# Patient Record
Sex: Male | Born: 1955 | Race: White | Hispanic: No | Marital: Married | Smoking: Current every day smoker
Health system: Southern US, Community
[De-identification: ages and names within clinical notes are randomized; demographics above are authoritative.]

## PROBLEM LIST (undated history)

## (undated) DIAGNOSIS — J45909 Unspecified asthma, uncomplicated: Secondary | ICD-10-CM

## (undated) DIAGNOSIS — I10 Essential (primary) hypertension: Secondary | ICD-10-CM

## (undated) DIAGNOSIS — J449 Chronic obstructive pulmonary disease, unspecified: Secondary | ICD-10-CM

## (undated) DIAGNOSIS — E119 Type 2 diabetes mellitus without complications: Secondary | ICD-10-CM

## (undated) DIAGNOSIS — E291 Testicular hypofunction: Secondary | ICD-10-CM

## (undated) DIAGNOSIS — E785 Hyperlipidemia, unspecified: Secondary | ICD-10-CM

## (undated) DIAGNOSIS — G4730 Sleep apnea, unspecified: Secondary | ICD-10-CM

## (undated) DIAGNOSIS — N529 Male erectile dysfunction, unspecified: Secondary | ICD-10-CM

## (undated) DIAGNOSIS — T7840XA Allergy, unspecified, initial encounter: Secondary | ICD-10-CM

## (undated) DIAGNOSIS — M1990 Unspecified osteoarthritis, unspecified site: Secondary | ICD-10-CM

## (undated) DIAGNOSIS — N189 Chronic kidney disease, unspecified: Secondary | ICD-10-CM

## (undated) HISTORY — DX: Unspecified asthma, uncomplicated: J45.909

## (undated) HISTORY — DX: Male erectile dysfunction, unspecified: N52.9

## (undated) HISTORY — DX: Essential (primary) hypertension: I10

## (undated) HISTORY — DX: Chronic kidney disease, unspecified: N18.9

## (undated) HISTORY — DX: Chronic obstructive pulmonary disease, unspecified: J44.9

## (undated) HISTORY — DX: Unspecified osteoarthritis, unspecified site: M19.90

## (undated) HISTORY — DX: Hyperlipidemia, unspecified: E78.5

## (undated) HISTORY — DX: Allergy, unspecified, initial encounter: T78.40XA

## (undated) HISTORY — DX: Type 2 diabetes mellitus without complications: E11.9

## (undated) HISTORY — PX: DENTAL SURGERY: SHX609

## (undated) HISTORY — DX: Testicular hypofunction: E29.1

## (undated) HISTORY — PX: VASECTOMY: SHX75

## (undated) HISTORY — DX: Sleep apnea, unspecified: G47.30

---

## 2006-09-12 ENCOUNTER — Ambulatory Visit (HOSPITAL_COMMUNITY): Admission: RE | Admit: 2006-09-12 | Discharge: 2006-09-12 | Payer: Self-pay | Admitting: Internal Medicine

## 2008-07-13 ENCOUNTER — Ambulatory Visit (HOSPITAL_COMMUNITY): Admission: RE | Admit: 2008-07-13 | Discharge: 2008-07-13 | Payer: Self-pay | Admitting: Internal Medicine

## 2013-08-28 ENCOUNTER — Telehealth: Payer: Self-pay | Admitting: *Deleted

## 2013-08-28 DIAGNOSIS — E119 Type 2 diabetes mellitus without complications: Secondary | ICD-10-CM

## 2013-08-28 MED ORDER — CANAGLIFLOZIN-METFORMIN HCL 50-1000 MG PO TABS: 50.0000 | ORAL_TABLET | Freq: Two times a day (BID) | ORAL | Status: DC

## 2013-08-28 NOTE — Telephone Encounter (Signed)
RX sent escribed to [facility]

## 2013-09-12 ENCOUNTER — Other Ambulatory Visit: Payer: Self-pay | Admitting: Emergency Medicine

## 2013-09-12 DIAGNOSIS — R7309 Other abnormal glucose: Secondary | ICD-10-CM

## 2013-09-15 ENCOUNTER — Other Ambulatory Visit: Payer: Self-pay

## 2013-09-15 DIAGNOSIS — R7309 Other abnormal glucose: Secondary | ICD-10-CM

## 2013-09-15 LAB — BASIC METABOLIC PANEL WITH GFR
BUN: 13 mg/dL (ref 6–23)
CO2: 24 mEq/L (ref 19–32)
GFR, Est African American: >89 mL/min
Glucose, Bld: 154 mg/dL — ABNORMAL HIGH (ref 70–99)
Potassium: 4.8 mEq/L (ref 3.5–5.3)
Sodium: 137 mEq/L (ref 135–145)

## 2013-09-22 ENCOUNTER — Other Ambulatory Visit: Payer: Self-pay | Admitting: Emergency Medicine

## 2013-09-22 DIAGNOSIS — E119 Type 2 diabetes mellitus without complications: Secondary | ICD-10-CM

## 2013-09-22 MED ORDER — CANAGLIFLOZIN-METFORMIN HCL 50-1000 MG PO TABS: 50.0000 | ORAL_TABLET | Freq: Two times a day (BID) | ORAL | Status: DC

## 2013-10-29 ENCOUNTER — Other Ambulatory Visit: Payer: Self-pay | Admitting: Emergency Medicine

## 2013-10-29 MED ORDER — TADALAFIL 20 MG PO TABS: 20.0000 mg | ORAL_TABLET | Freq: Every day | ORAL | Status: DC | PRN

## 2013-12-19 ENCOUNTER — Other Ambulatory Visit: Payer: Self-pay | Admitting: Emergency Medicine

## 2013-12-28 DIAGNOSIS — E291 Testicular hypofunction: Secondary | ICD-10-CM | POA: Insufficient documentation

## 2013-12-28 DIAGNOSIS — I10 Essential (primary) hypertension: Secondary | ICD-10-CM | POA: Insufficient documentation

## 2013-12-28 DIAGNOSIS — E785 Hyperlipidemia, unspecified: Secondary | ICD-10-CM | POA: Insufficient documentation

## 2013-12-28 DIAGNOSIS — J45909 Unspecified asthma, uncomplicated: Secondary | ICD-10-CM | POA: Insufficient documentation

## 2013-12-29 ENCOUNTER — Encounter: Payer: Self-pay | Admitting: Physician Assistant

## 2013-12-29 ENCOUNTER — Ambulatory Visit (INDEPENDENT_AMBULATORY_CARE_PROVIDER_SITE_OTHER): Payer: 59 | Admitting: Physician Assistant

## 2013-12-29 VITALS — BP 128/72 | HR 76 | Temp 98.3°F | Resp 16 | Ht 68.5 in | Wt 243.0 lb

## 2013-12-29 DIAGNOSIS — Z125 Encounter for screening for malignant neoplasm of prostate: Secondary | ICD-10-CM

## 2013-12-29 DIAGNOSIS — E119 Type 2 diabetes mellitus without complications: Secondary | ICD-10-CM

## 2013-12-29 DIAGNOSIS — Z1331 Encounter for screening for depression: Secondary | ICD-10-CM

## 2013-12-29 DIAGNOSIS — F17200 Nicotine dependence, unspecified, uncomplicated: Secondary | ICD-10-CM

## 2013-12-29 DIAGNOSIS — Z0000 Encounter for general adult medical examination without abnormal findings: Secondary | ICD-10-CM

## 2013-12-29 DIAGNOSIS — I10 Essential (primary) hypertension: Secondary | ICD-10-CM

## 2013-12-29 LAB — LIPID PANEL
CHOL/HDL RATIO: 4.9 ratio
Cholesterol: 178 mg/dL (ref 0–200)
HDL: 36 mg/dL — AB (ref >39)
LDL CALC: 113 mg/dL — AB (ref 0–99)
TRIGLYCERIDES: 143 mg/dL (ref <150)
VLDL: 29 mg/dL (ref 0–40)

## 2013-12-29 LAB — CBC WITH DIFFERENTIAL/PLATELET
BASOS ABS: 0 10*3/uL (ref 0.0–0.1)
Basophils Relative: 0 % (ref 0–1)
Eosinophils Absolute: 0.1 10*3/uL (ref 0.0–0.7)
Eosinophils Relative: 1 % (ref 0–5)
HEMATOCRIT: 46.6 % (ref 39.0–52.0)
Hemoglobin: 15.8 g/dL (ref 13.0–17.0)
LYMPHS PCT: 26 % (ref 12–46)
Lymphs Abs: 2.4 10*3/uL (ref 0.7–4.0)
MCH: 29 pg (ref 26.0–34.0)
MCHC: 33.9 g/dL (ref 30.0–36.0)
MCV: 85.5 fL (ref 78.0–100.0)
Monocytes Absolute: 0.7 10*3/uL (ref 0.1–1.0)
Monocytes Relative: 7 % (ref 3–12)
NEUTROS ABS: 6.1 10*3/uL (ref 1.7–7.7)
NEUTROS PCT: 66 % (ref 43–77)
PLATELETS: 222 10*3/uL (ref 150–400)
RBC: 5.45 MIL/uL (ref 4.22–5.81)
RDW: 13.8 % (ref 11.5–15.5)
WBC: 9.3 10*3/uL (ref 4.0–10.5)

## 2013-12-29 LAB — HEMOGLOBIN A1C
Hgb A1c MFr Bld: 7.2 % — ABNORMAL HIGH (ref <5.7)
Mean Plasma Glucose: 160 mg/dL — ABNORMAL HIGH (ref <117)

## 2013-12-29 LAB — MAGNESIUM: MAGNESIUM: 1.8 mg/dL (ref 1.5–2.5)

## 2013-12-29 LAB — HEPATIC FUNCTION PANEL
ALBUMIN: 4.3 g/dL (ref 3.5–5.2)
ALK PHOS: 75 U/L (ref 39–117)
ALT: 12 U/L (ref 0–53)
AST: 13 U/L (ref 0–37)
BILIRUBIN INDIRECT: 0.4 mg/dL (ref 0.2–1.2)
BILIRUBIN TOTAL: 0.5 mg/dL (ref 0.2–1.2)
Bilirubin, Direct: 0.1 mg/dL (ref 0.0–0.3)
Total Protein: 7.5 g/dL (ref 6.0–8.3)

## 2013-12-29 LAB — BASIC METABOLIC PANEL WITH GFR
BUN: 18 mg/dL (ref 6–23)
CHLORIDE: 96 meq/L (ref 96–112)
CO2: 25 mEq/L (ref 19–32)
Calcium: 9.6 mg/dL (ref 8.4–10.5)
Creat: 1.02 mg/dL (ref 0.50–1.35)
GFR, EST NON AFRICAN AMERICAN: 82 mL/min
GFR, Est African American: >89 mL/min
Glucose, Bld: 129 mg/dL — ABNORMAL HIGH (ref 70–99)
POTASSIUM: 4.5 meq/L (ref 3.5–5.3)
SODIUM: 131 meq/L — AB (ref 135–145)

## 2013-12-29 LAB — IRON AND TIBC
%SAT: 26 % (ref 20–55)
IRON: 97 ug/dL (ref 42–165)
TIBC: 376 ug/dL (ref 215–435)
UIBC: 279 ug/dL (ref 125–400)

## 2013-12-29 LAB — URIC ACID: Uric Acid, Serum: 4.9 mg/dL (ref 4.0–7.8)

## 2013-12-29 MED ORDER — CANAGLIFLOZIN-METFORMIN HCL 50-1000 MG PO TABS
50.0000 | ORAL_TABLET | Freq: Two times a day (BID) | ORAL | Status: DC
Start: 2013-12-29 — End: 2014-06-01

## 2013-12-29 MED ORDER — TADALAFIL 20 MG PO TABS: ORAL_TABLET | ORAL | Status: DC

## 2013-12-29 MED ORDER — ENALAPRIL MALEATE 10 MG PO TABS
10.0000 mg | ORAL_TABLET | Freq: Every day | ORAL | Status: DC
Start: 2013-12-29 — End: 2014-06-01

## 2013-12-29 MED ORDER — LOVASTATIN 20 MG PO TABS: 20.0000 mg | ORAL_TABLET | Freq: Every day | ORAL | Status: DC

## 2013-12-29 NOTE — Patient Instructions (Signed)
Preventative Care for Adults, Male       REGULAR HEALTH EXAMS:  A routine yearly physical is a good way to check in with your primary care provider about your health and preventive screening. It is also an opportunity to share updates about your health and any concerns you have, and receive a thorough all-over exam.   Most health insurance companies pay for at least some preventative services.  Check with your health plan for specific coverages.  WHAT PREVENTATIVE SERVICES DO MEN NEED?  Adult men should have their weight and blood pressure checked regularly.   Men age 35 and older should have their cholesterol levels checked regularly.  Beginning at age 50 and continuing to age 75, men should be screened for colorectal cancer.  Certain people should may need continued testing until age 85.  Other cancer screening may include exams for testicular and prostate cancer.  Updating vaccinations is part of preventative care.  Vaccinations help protect against diseases such as the flu.  Lab tests are generally done as part of preventative care to screen for anemia and blood disorders, to screen for problems with the kidneys and liver, to screen for bladder problems, to check blood sugar, and to check your cholesterol level.  Preventative services generally include counseling about diet, exercise, avoiding tobacco, drugs, excessive alcohol consumption, and sexually transmitted infections.    GENERAL RECOMMENDATIONS FOR GOOD HEALTH:  Healthy diet:  Eat a variety of foods, including fruit, vegetables, animal or vegetable protein, such as meat, fish, chicken, and eggs, or beans, lentils, tofu, and grains, such as rice.  Drink plenty of water daily.  Decrease saturated fat in the diet, avoid lots of red meat, processed foods, sweets, fast foods, and fried foods.  Exercise:  Aerobic exercise helps maintain good heart health. At least 30-40 minutes of moderate-intensity exercise is recommended.  For example, a brisk walk that increases your heart rate and breathing. This should be done on most days of the week.   Find a type of exercise or a variety of exercises that you enjoy so that it becomes a part of your daily life.  Examples are running, walking, swimming, water aerobics, and biking.  For motivation and support, explore group exercise such as aerobic class, spin class, Zumba, Yoga,or  martial arts, etc.    Set exercise goals for yourself, such as a certain weight goal, walk or run in a race such as a 5k walk/run.  Speak to your primary care provider about exercise goals.  Disease prevention:  If you smoke or chew tobacco, find out from your caregiver how to quit. It can literally save your life, no matter how long you have been a tobacco user. If you do not use tobacco, never begin.   Maintain a healthy diet and normal weight. Increased weight leads to problems with blood pressure and diabetes.   The Body Mass Index or BMI is a way of measuring how much of your body is fat. Having a BMI above 27 increases the risk of heart disease, diabetes, hypertension, stroke and other problems related to obesity. Your caregiver can help determine your BMI and based on it develop an exercise and dietary program to help you achieve or maintain this important measurement at a healthful level.  High blood pressure causes heart and blood vessel problems.  Persistent high blood pressure should be treated with medicine if weight loss and exercise do not work.   Fat and cholesterol leaves deposits in your arteries   that can block them. This causes heart disease and vessel disease elsewhere in your body.  If your cholesterol is found to be high, or if you have heart disease or certain other medical conditions, then you may need to have your cholesterol monitored frequently and be treated with medication.   Ask if you should have a stress test if your history suggests this. A stress test is a test done on  a treadmill that looks for heart disease. This test can find disease prior to there being a problem.  Avoid drinking alcohol in excess (more than two drinks per day).  Avoid use of street drugs. Do not share needles with anyone. Ask for professional help if you need assistance or instructions on stopping the use of alcohol, cigarettes, and/or drugs.  Brush your teeth twice a day with fluoride toothpaste, and floss once a day. Good oral hygiene prevents tooth decay and gum disease. The problems can be painful, unattractive, and can cause other health problems. Visit your dentist for a routine oral and dental check up and preventive care every 6-12 months.   Look at your skin regularly.  Use a mirror to look at your back. Notify your caregivers of changes in moles, especially if there are changes in shapes, colors, a size larger than a pencil eraser, an irregular border, or development of new moles.  Safety:  Use seatbelts 100% of the time, whether driving or as a passenger.  Use safety devices such as hearing protection if you work in environments with loud noise or significant background noise.  Use safety glasses when doing any work that could send debris in to the eyes.  Use a helmet if you ride a bike or motorcycle.  Use appropriate safety gear for contact sports.  Talk to your caregiver about gun safety.  Use sunscreen with a SPF (or skin protection factor) of 15 or greater.  Lighter skinned people are at a greater risk of skin cancer. Don't forget to also wear sunglasses in order to protect your eyes from too much damaging sunlight. Damaging sunlight can accelerate cataract formation.   Practice safe sex. Use condoms. Condoms are used for birth control and to help reduce the spread of sexually transmitted infections (or STIs).  Some of the STIs are gonorrhea (the clap), chlamydia, syphilis, trichomonas, herpes, HPV (human papilloma virus) and HIV (human immunodeficiency virus) which causes AIDS.  The herpes, HIV and HPV are viral illnesses that have no cure. These can result in disability, cancer and death.   Keep carbon monoxide and smoke detectors in your home functioning at all times. Change the batteries every 6 months or use a model that plugs into the wall.   Vaccinations:  Stay up to date with your tetanus shots and other required immunizations. You should have a booster for tetanus every 10 years. Be sure to get your flu shot every year, since 5%-20% of the [place] population comes down with the flu. The flu vaccine changes each year, so being vaccinated once is not enough. Get your shot in the fall, before the flu season peaks.   Other vaccines to consider:  Pneumococcal vaccine to protect against certain types of pneumonia.  This is normally recommended for adults age 65 or older.  However, adults younger than 56 years old with certain underlying conditions such as diabetes, heart or lung disease should also receive the vaccine.  Shingles vaccine to protect against Varicella Zoster if you are older than age 60, or younger   than 56 years old with certain underlying illness.  Hepatitis A vaccine to protect against a form of infection of the liver by a virus acquired from food.  Hepatitis B vaccine to protect against a form of infection of the liver by a virus acquired from blood or body fluids, particularly if you work in health care.  If you plan to travel internationally, check with your local health department for specific vaccination recommendations.  Cancer Screening:  Most routine colon cancer screening begins at the age of 50. On a yearly basis, doctors may provide special easy to use take-home tests to check for hidden blood in the stool. Sigmoidoscopy or colonoscopy can detect the earliest forms of colon cancer and is life saving. These tests use a small camera at the end of a tube to directly examine the colon. Speak to your caregiver about this at age 50, when routine  screening begins (and is repeated every 5 years unless early forms of pre-cancerous polyps or small growths are found).   At the age of 50 men usually start screening for prostate cancer every year. Screening may begin at a younger age for those with higher risk. Those at higher risk include African-Americans or having a family history of prostate cancer. There are two types of tests for prostate cancer:   Prostate-specific antigen (PSA) testing. Recent studies raise questions about prostate cancer using PSA and you should discuss this with your caregiver.   Digital rectal exam (in which your doctor's lubricated and gloved finger feels for enlargement of the prostate through the anus).   Screening for testicular cancer.  Do a monthly exam of your testicles. Gently roll each testicle between your thumb and fingers, feeling for any abnormal lumps. The best time to do this is after a hot shower or bath when the tissues are looser. Notify your caregivers of any lumps, tenderness or changes in size or shape immediately.    Smoking Cessation Quitting smoking is important to your health and has many advantages. However, it is not always easy to quit since nicotine is a very addictive drug. Often times, people try 3 times or more before being able to quit. This document explains the best ways for you to prepare to quit smoking. Quitting takes hard work and a lot of effort, but you can do it. ADVANTAGES OF QUITTING SMOKING  You will live longer, feel better, and live better.  Your body will feel the impact of quitting smoking almost immediately.  Within 20 minutes, blood pressure decreases. Your pulse returns to its normal level.  After 8 hours, carbon monoxide levels in the blood return to normal. Your oxygen level increases.  After 24 hours, the chance of having a heart attack starts to decrease. Your breath, hair, and body stop smelling like smoke.  After 48 hours, damaged nerve endings begin to  recover. Your sense of taste and smell improve.  After 72 hours, the body is virtually free of nicotine. Your bronchial tubes relax and breathing becomes easier.  After 2 to 12 weeks, lungs can hold more air. Exercise becomes easier and circulation improves.  The risk of having a heart attack, stroke, cancer, or lung disease is greatly reduced.  After 1 year, the risk of coronary heart disease is cut in half.  After 5 years, the risk of stroke falls to the same as a nonsmoker.  After 10 years, the risk of lung cancer is cut in half and the risk of other cancers decreases   significantly.  After 15 years, the risk of coronary heart disease drops, usually to the level of a nonsmoker.  If you are pregnant, quitting smoking will improve your chances of having a healthy baby.  The people you live with, especially any children, will be healthier.  You will have extra money to spend on things other than cigarettes. QUESTIONS TO THINK ABOUT BEFORE ATTEMPTING TO QUIT You may want to talk about your answers with your caregiver.  Why do you want to quit?  If you tried to quit in the past, what helped and what did not?  What will be the most difficult situations for you after you quit? How will you plan to handle them?  Who can help you through the tough times? Your family? Friends? A caregiver?  What pleasures do you get from smoking? What ways can you still get pleasure if you quit? Here are some questions to ask your caregiver:  How can you help me to be successful at quitting?  What medicine do you think would be best for me and how should I take it?  What should I do if I need more help?  What is smoking withdrawal like? How can I get information on withdrawal? GET READY  Set a quit date.  Change your environment by getting rid of all cigarettes, ashtrays, matches, and lighters in your home, car, or work. Do not let people smoke in your home.  Review your past attempts to quit.  Think about what worked and what did not. GET SUPPORT AND ENCOURAGEMENT You have a better chance of being successful if you have help. You can get support in many ways.  Tell your family, friends, and co-workers that you are going to quit and need their support. Ask them not to smoke around you.  Get individual, group, or telephone counseling and support. Programs are available at [employer] and health centers. Call your local health department for information about programs in your area.  Spiritual beliefs and practices may help some smokers quit.  Download a "quit meter" on your computer to keep track of quit statistics, such as how long you have gone without smoking, cigarettes not smoked, and money saved.  Get a self-help book about quitting smoking and staying off of tobacco. Poland yourself from urges to smoke. Talk to someone, go for a walk, or occupy your time with a task.  Change your normal routine. Take a different route to work. Drink tea instead of coffee. Eat breakfast in a different place.  Reduce your stress. Take a hot bath, exercise, or read a book.  Plan something enjoyable to do every day. Reward yourself for not smoking.  Explore interactive web-based programs that specialize in helping you quit. GET MEDICINE AND USE IT CORRECTLY Medicines can help you stop smoking and decrease the urge to smoke. Combining medicine with the above behavioral methods and support can greatly increase your chances of successfully quitting smoking.  Nicotine replacement therapy helps deliver nicotine to your body without the negative effects and risks of smoking. Nicotine replacement therapy includes nicotine gum, lozenges, inhalers, nasal sprays, and skin patches. Some may be available over-the-counter and others require a prescription.  Antidepressant medicine helps people abstain from smoking, but how this works is unknown. This medicine is  available by prescription.  Nicotinic receptor partial agonist medicine simulates the effect of nicotine in your brain. This medicine is available by prescription. Ask your caregiver for advice  about which medicines to use and how to use them based on your health history. Your caregiver will tell you what side effects to look out for if you choose to be on a medicine or therapy. Carefully read the information on the package. Do not use any other product containing nicotine while using a nicotine replacement product.  RELAPSE OR DIFFICULT SITUATIONS Most relapses occur within the first 3 months after quitting. Do not be discouraged if you start smoking again. Remember, most people try several times before finally quitting. You may have symptoms of withdrawal because your body is used to nicotine. You may crave cigarettes, be irritable, feel very hungry, cough often, get headaches, or have difficulty concentrating. The withdrawal symptoms are only temporary. They are strongest when you first quit, but they will go away within 10 14 days. To reduce the chances of relapse, try to:  Avoid drinking alcohol. Drinking lowers your chances of successfully quitting.  Reduce the amount of caffeine you consume. Once you quit smoking, the amount of caffeine in your body increases and can give you symptoms, such as a rapid heartbeat, sweating, and anxiety.  Avoid smokers because they can make you want to smoke.  Do not let weight gain distract you. Many smokers will gain weight when they quit, usually less than 10 pounds. Eat a healthy diet and stay active. You can always lose the weight gained after you quit.  Find ways to improve your mood other than smoking. FOR MORE INFORMATION  www.smokefree.gov  Document Released: 10/03/2001 Document Revised: 04/09/2012 Document Reviewed: 01/18/2012 [employer] Patient Information 2014 [place], [place].

## 2013-12-29 NOTE — Progress Notes (Signed)
Complete Physical HPI 58 y.o. male  presents for a complete physical. His blood pressure has been controlled at home, today their BP is BP: 128/72 mmHg He does not workout. He denies chest pain, shortness of breath, dizziness.  He is on cholesterol medication and denies myalgias. His cholesterol is not at goal. The cholesterol last visit was:   He has not been working on diet and exercise for diabetes, and denies foot ulcerations, hypoglycemia , paresthesia of the feet, polydipsia, polyuria and visual disturbances. Last A1C in the office was:  He is on Invokamet but he is only taking once daily. His sugar highest has been 119.  Patient is on Vitamin D supplement.   Patient has DOT  and goes yearly, so does not get physical's here.   Current Medications:  Current Outpatient Prescriptions on File Prior to Visit  Medication Sig Dispense Refill  . Canagliflozin-Metformin HCl (INVOKAMET) 50-1000 MG TABS Take 50-1,000 tablets by mouth 2 (two) times daily.  60 tablet  2  . Cholecalciferol (VITAMIN D PO) Take 5,000 Int'l Units by mouth daily.      Marland Kitchen CIALIS 20 MG tablet TAKE 1 TABLET (20 MG TOTAL) BY MOUTH DAILY AS NEEDED FOR ERECTILE DYSFUNCTION.  10 tablet  0  . enalapril (VASOTEC) 10 MG tablet Take 10 mg by mouth daily.      Marland Kitchen lovastatin (MEVACOR) 20 MG tablet Take 20 mg by mouth at bedtime.       No current facility-administered medications on file prior to visit.   Health Maintenance:  Tetanus: Needs Pneumovax: declines Flu vaccine: 07/2013 Zostavax: declines DEXA:N/A Colonoscopy: declines EGD: N/A  Allergies:  Allergies  Allergen Reactions  . Flax [Bio-Flax]    Medical History:  Past Medical History  Diagnosis Date  . Hyperlipidemia   . Hypertension   . Asthma   . Erectile dysfunction   . Other testicular hypofunction   . Type II or unspecified type diabetes mellitus without mention of complication, not stated as uncontrolled    Surgical History: No past surgical history on  file. Family History:  Family History  Problem Relation Age of Onset  . Stroke Mother   . Hypertension Mother   . Diabetes Mother   . Cancer Father     lung   Social History:   History  Substance Use Topics  . Smoking status: Current Every Day Smoker  . Smokeless tobacco: Not on file  . Alcohol Use: No   ROS:  [X]  = complains of  [ ]  = denies  General: Fatigue [ ]  Fever [ ]  Chills [ ]  Weakness [ ]   Insomnia [ ]                 Weight change [ ]  Night sweats [ ]   Change in appetite [ ]  Eyes: Last May 2014 Redness [ ]  Blurred vision [ ]  Diplopia [ ]  Discharge [ ]   ENT: Congestion [ ]  Sinus Pain [ ]  Post Nasal Drip [ ]  Sore Throat [ ]  Earache [ ]  hearing loss [ ]  Tinnitus [ ]  Snoring [ ]   Cardiac: Chest pain/pressure [ ]  SOB [ ]  Orthopnea [ ]   Palpitations [ ]   Paroxysmal nocturnal dyspnea[ ]  Claudication [ ]  Edema [ ]   Pulmonary: Cough [ ]  Wheezing[ ]   SOB [ ]   Pleurisy [ ]   GI: Nausea [ ]  Vomiting[ ]  Dysphagia[ ]  Heartburn[ ]  Abdominal pain [ ]  Constipation [ ] ; Diarrhea [ ]  BRBPR [ ]  Melena[ ]  Bloating [ ]   Hemorrhoids [ ]   GU: Hematuria[ ]  Dysuria [ ]  Nocturia[ ]  Urgency [ ]   Hesitancy [ ]  Discharge [ ]  Frequency [ ]   Neuro: Headaches[ ]  Vertigo[ ]  Paresthesias[ ]  Spasm [ ]  Speech changes [ ]  Incoordination [ ]   Ortho: Arthritis [ ]  Joint pain [ ]  Muscle pain [ ]  Joint swelling [ ]  Back Pain [ ]  Skin:  Rash [ ]   Pruritis [ ]  Change in skin lesion [ ]   Psych: Depression[ ]  Anxiety[ ]  Confusion [ ]  Memory loss [ ]   Heme/Lypmh: Bleeding [ ]  Bruising [ ]  Enlarged lymph nodes [ ]   Endocrine: Visual blurring [ ]  Paresthesia [ ]  Polyuria [ ]  Polydypsea [ ]    Heat/cold intolerance [ ]  Hypoglycemia [ ]   Physical Exam: Estimated body mass index is 36.41 kg/(m^2) as calculated from the following:   Height as of this encounter: 5' 8.5" (1.74 m).   Weight as of this encounter: 243 lb (110.224 kg). Filed Vitals:   12/29/13 1013  BP: 128/72  Pulse: 76  Temp: 98.3 F (36.8 C)  Resp: 16    General Appearance: Well nourished, in no apparent distress. Eyes: PERRLA, EOMs, conjunctiva no swelling or erythema, normal fundi and vessels. Sinuses: No Frontal/maxillary tenderness ENT/Mouth: Ext aud canals clear, normal light reflex with TMs without erythema, bulging. Good dentition. No erythema, swelling, or exudate on post pharynx. Tonsils not swollen or erythematous. Hearing normal.  Neck: Supple, thyroid normal. No bruits Respiratory: Respiratory effort normal, BS equal bilaterally without rales, rhonchi, wheezing or stridor. Cardio: RRR without murmurs, rubs or gallops. Brisk peripheral pulses without edema.  Chest: symmetric, with normal excursions and percussion. Abdomen: Soft, obese +BS. Non tender, no guarding, rebound, hernias, masses, or organomegaly. .  Lymphatics: Non tender without lymphadenopathy.  Genitourinary: defer Musculoskeletal: Full ROM all peripheral extremities,5/5 strength, and normal gait. Skin: Warm, dry without rashes, lesions, ecchymosis.  Neuro: Cranial nerves intact, reflexes equal bilaterally. Normal muscle tone, no cerebellar symptoms. Sensation intact.  Psych: Awake and oriented X 3, normal affect, Insight and Judgment appropriate.   EKG: WNL no changes.  Assessment and Plan: Hyperlipidemia--controlled, continue the same medications  Hypertension- continue same meds  Asthma- smoking cessation  Erectile dysfunction- discussed due to smoking, DM, and obesity  Other testicular hypofunction- check testosterone  Type II or unspecified type diabetes mellitus without mention of complication, not stated as uncontrolled-Discussed general issues about diabetes pathophysiology and management., Educational material distributed., Suggested low cholesterol diet., Encouraged aerobic exercise., Discussed foot care., Reminded to get yearly retinal exam.  Smoking cessation counseled, patient declines CXR at this time Colonoscopy patient declines despite discussing  the risk factors TDAP Obesity- BMI 36- discussed weight loss/diet  Discussed med's effects and SE's. Screening labs and tests as requested with regular follow-up as recommended.  Vicie Mutters 10:31 AM

## 2013-12-30 ENCOUNTER — Other Ambulatory Visit: Payer: Self-pay | Admitting: Physician Assistant

## 2013-12-30 DIAGNOSIS — F17200 Nicotine dependence, unspecified, uncomplicated: Secondary | ICD-10-CM

## 2013-12-30 LAB — INSULIN, FASTING: Insulin fasting, serum: 15 u[IU]/mL (ref 3–28)

## 2013-12-30 LAB — TSH: TSH: 2.124 u[IU]/mL (ref 0.350–4.500)

## 2013-12-30 LAB — VITAMIN D 25 HYDROXY (VIT D DEFICIENCY, FRACTURES): VIT D 25 HYDROXY: 22 ng/mL — AB (ref 30–89)

## 2013-12-30 LAB — FERRITIN: Ferritin: 78 ng/mL (ref 22–322)

## 2013-12-30 LAB — PSA: PSA: 0.29 ng/mL (ref <=4.00)

## 2013-12-30 LAB — VITAMIN B12: Vitamin B-12: 338 pg/mL (ref 211–911)

## 2013-12-30 LAB — TESTOSTERONE: Testosterone: 330 ng/dL (ref 300–890)

## 2014-04-13 ENCOUNTER — Ambulatory Visit: Payer: Self-pay | Admitting: Physician Assistant

## 2014-05-15 ENCOUNTER — Encounter: Payer: Self-pay | Admitting: Physician Assistant

## 2014-06-01 ENCOUNTER — Ambulatory Visit (INDEPENDENT_AMBULATORY_CARE_PROVIDER_SITE_OTHER): Payer: 59 | Admitting: Physician Assistant

## 2014-06-01 ENCOUNTER — Encounter: Payer: Self-pay | Admitting: Physician Assistant

## 2014-06-01 VITALS — BP 120/70 | HR 80 | Temp 97.7°F | Resp 16 | Ht 69.5 in | Wt 248.0 lb

## 2014-06-01 DIAGNOSIS — E291 Testicular hypofunction: Secondary | ICD-10-CM

## 2014-06-01 DIAGNOSIS — I10 Essential (primary) hypertension: Secondary | ICD-10-CM

## 2014-06-01 DIAGNOSIS — E559 Vitamin D deficiency, unspecified: Secondary | ICD-10-CM

## 2014-06-01 DIAGNOSIS — E785 Hyperlipidemia, unspecified: Secondary | ICD-10-CM

## 2014-06-01 DIAGNOSIS — Z79899 Other long term (current) drug therapy: Secondary | ICD-10-CM

## 2014-06-01 DIAGNOSIS — E119 Type 2 diabetes mellitus without complications: Secondary | ICD-10-CM

## 2014-06-01 DIAGNOSIS — E669 Obesity, unspecified: Secondary | ICD-10-CM

## 2014-06-01 LAB — CBC WITH DIFFERENTIAL/PLATELET
BASOS ABS: 0 10*3/uL (ref 0.0–0.1)
Basophils Relative: 0 % (ref 0–1)
EOS PCT: 1 % (ref 0–5)
Eosinophils Absolute: 0.1 10*3/uL (ref 0.0–0.7)
HEMATOCRIT: 44 % (ref 39.0–52.0)
HEMOGLOBIN: 14.8 g/dL (ref 13.0–17.0)
LYMPHS ABS: 2.8 10*3/uL (ref 0.7–4.0)
LYMPHS PCT: 29 % (ref 12–46)
MCH: 28.9 pg (ref 26.0–34.0)
MCHC: 33.6 g/dL (ref 30.0–36.0)
MCV: 85.9 fL (ref 78.0–100.0)
MONO ABS: 0.8 10*3/uL (ref 0.1–1.0)
MONOS PCT: 8 % (ref 3–12)
NEUTROS ABS: 6 10*3/uL (ref 1.7–7.7)
Neutrophils Relative %: 62 % (ref 43–77)
Platelets: 215 10*3/uL (ref 150–400)
RBC: 5.12 MIL/uL (ref 4.22–5.81)
RDW: 13.8 % (ref 11.5–15.5)
WBC: 9.6 10*3/uL (ref 4.0–10.5)

## 2014-06-01 MED ORDER — ALBUTEROL SULFATE HFA 108 (90 BASE) MCG/ACT IN AERS: 2.0000 | INHALATION_SPRAY | RESPIRATORY_TRACT | Status: DC | PRN

## 2014-06-01 MED ORDER — TADALAFIL 20 MG PO TABS: ORAL_TABLET | ORAL | Status: DC

## 2014-06-01 MED ORDER — METFORMIN HCL ER 500 MG PO TB24: 1000.0000 mg | ORAL_TABLET | Freq: Two times a day (BID) | ORAL | Status: DC

## 2014-06-01 MED ORDER — ENALAPRIL MALEATE 10 MG PO TABS: 10.0000 mg | ORAL_TABLET | Freq: Every day | ORAL | Status: DC

## 2014-06-01 MED ORDER — LOVASTATIN 20 MG PO TABS: 20.0000 mg | ORAL_TABLET | Freq: Every day | ORAL | Status: DC

## 2014-06-01 NOTE — Patient Instructions (Signed)
We are starting you on Metformin to prevent or treat diabetes. Metformin does not cause low blood sugars. In order to create energy your cells need insulin and sugar but sometime your cells do not accept the insulin and this can cause increased sugars and decreased energy. The Metformin helps your cells accept insulin and the sugar to give you more energy.   The two most common side effects are nausea and diarrhea, follow these rules to avoid it! You can take imodium per box instructions when starting metformin if needed.   Rules of metformin: 1) start out slow with only one pill daily. Our goal for you is 4 pills a day or 2000mg  total.  2) take with your largest meal. 3) Take with least amount of carbs.   Call if you have any problems.   We want weight loss that will last so you should lose 1-2 pounds a week.  THAT IS IT! Please pick THREE things a month to change. Once it is a habit check off the item. Then pick another three items off the list to become habits.  If you are already doing a habit on the list GREAT!  Cross that item off! o Don't drink your calories. Ie, alcohol, soda, fruit juice, and sweet tea.  o Drink more water. Drink a glass when you feel hungry or before each meal.  o Eat breakfast - Complex carb and protein (likeDannon light and fit yogurt, oatmeal, fruit, eggs, [place] bacon). o Measure your cereal.  Eat no more than one cup a day. (ie [place]) o Eat an apple a day. o Add a vegetable a day. o Try a new vegetable a month. o Use Pam! Stop using oil or butter to cook. o Don't finish your plate or use smaller plates. o Share your dessert. o Eat sugar free Jello for dessert or frozen grapes. o Don't eat 2-3 hours before bed. o Switch to whole wheat bread, pasta, and brown rice. o Make healthier choices when you eat out. No fries! o Pick baked chicken, NOT fried. o Don't forget to SLOW DOWN when you eat. It is not going anywhere.  o Take the stairs. o Park far away in the  parking lot o [employer] (or weights) for 10 minutes while watching TV. o Walk at work for 10 minutes during break. o Walk outside 1 time a week with your friend, kids, dog, or significant other. o Start a walking group at [place] the mall as much as you can tolerate.  o Keep a food diary. o Weigh yourself daily. o Walk for 15 minutes 3 days per week. o Cook at home more often and eat out less.  If life happens and you go back to old habits, it is okay.  Just start over. You can do it!   If you experience chest pain, get short of breath, or tired during the exercise, please stop immediately and inform your doctor.

## 2014-06-01 NOTE — Progress Notes (Signed)
Assessment and Plan:  Hypertension: Continue medication, monitor blood pressure at home. Continue DASH diet. Cholesterol: Continue diet and exercise. Check cholesterol.  Diabetes-Continue diet and exercise. Check A1C, will stop invokamet and continue metformin, continue ACE Vitamin D Def- check level and continue medications.  Asthma- continue proair  Continue diet and meds as discussed. Further disposition pending results of labs. Discussed med's effects and SE's.    HPI 58 y.o. male  presents for 3 month follow up with hypertension, hyperlipidemia, diabetes and vitamin D. His blood pressure has been controlled at home, today their BP is BP: 120/70 mmHg He does not workout. He denies chest pain, shortness of breath, dizziness.  He is on cholesterol medication and denies myalgias. His cholesterol is not at goal. The cholesterol last visit was:   Lab Results  Component Value Date   CHOL 178 12/29/2013   HDL 36* 12/29/2013   LDLCALC 113* 12/29/2013   TRIG 143 12/29/2013   CHOLHDL 4.9 12/29/2013   He has been working on diet and exercise for Diabetes, he was put on invokamet but could not tolerate it due to yeast infections, and denies polydipsia, polyuria and visual disturbances. Last A1C in the office was:  Lab Results  Component Value Date   HGBA1C 7.2* 12/29/2013   Patient is on Vitamin D supplement. Lab Results  Component Value Date   VD25OH 22* 12/29/2013      Current Medications:  Current Outpatient Prescriptions on File Prior to Visit  Medication Sig Dispense Refill  . albuterol (PROAIR HFA) 108 (90 BASE) MCG/ACT inhaler Inhale 2 puffs into the lungs every 6 (six) hours as needed for wheezing or shortness of breath.      . Canagliflozin-Metformin HCl (INVOKAMET) 50-1000 MG TABS Take 50-1,000 tablets by mouth 2 (two) times daily.  60 tablet  2  . Cholecalciferol (VITAMIN D PO) Take 5,000 Int'l Units by mouth daily.      . enalapril (VASOTEC) 10 MG tablet Take 1 tablet (10 mg total) by  mouth daily.  90 tablet  1  . lovastatin (MEVACOR) 20 MG tablet Take 1 tablet (20 mg total) by mouth at bedtime.  90 tablet  1  . tadalafil (CIALIS) 20 MG tablet TAKE 1 TABLET (20 MG TOTAL) BY MOUTH DAILY AS NEEDED FOR ERECTILE DYSFUNCTION.  6 tablet  2   No current facility-administered medications on file prior to visit.   Medical History:  Past Medical History  Diagnosis Date  . Hyperlipidemia   . Hypertension   . Asthma   . Erectile dysfunction   . Other testicular hypofunction   . Type II or unspecified type diabetes mellitus without mention of complication, not stated as uncontrolled    Allergies:  Allergies  Allergen Reactions  . Flax [Bio-Flax]      Review of Systems: [X]  = complains of  [ ]  = denies  General: Fatigue [ ]  Fever [ ]  Chills [ ]  Weakness [ ]   Insomnia [ ]  Eyes: Redness [ ]  Blurred vision [ ]  Diplopia [ ]   ENT: Congestion [ ]  Sinus Pain [ ]  Post Nasal Drip [ ]  Sore Throat [ ]  Earache [ ]   Cardiac: Chest pain/pressure [ ]  SOB [ ]  Orthopnea [ ]   Palpitations [ ]   Paroxysmal nocturnal dyspnea[ ]  Claudication [ ]  Edema [ ]   Pulmonary: Cough [ ]  Wheezing[ ]   SOB [ ]   Snoring [ ]   GI: Nausea [ ]  Vomiting[ ]  Dysphagia[ ]  Heartburn[ ]  Abdominal pain [ ]  Constipation [ ] ;  Diarrhea [ ] ; BRBPR [ ]  Melena[ ]  GU: Hematuria[ ]  Dysuria [ ]  Nocturia[ ]  Urgency [ ]   Hesitancy [ ]  Discharge [ ]  Neuro: Headaches[ ]  Vertigo[ ]  Paresthesias[ ]  Spasm [ ]  Speech changes [ ]  Incoordination [ ]   Ortho: Arthritis [ ]  Joint pain [ ]  Muscle pain [ ]  Joint swelling [ ]  Back Pain [ ]  Skin:  Rash [ ]   Pruritis [ ]  Change in skin lesion [ ]   Psych: Depression[ ]  Anxiety[ ]  Confusion [ ]  Memory loss [ ]   Heme/Lypmh: Bleeding [ ]  Bruising [ ]  Enlarged lymph nodes [ ]   Endocrine: Visual blurring [ ]  Paresthesia [ ]  Polyuria [ ]  Polydypsea [ ]    Heat/cold intolerance [ ]  Hypoglycemia [ ]   Family history- Review and unchanged Social history- Review and unchanged Physical Exam: BP 120/70   Pulse 80  Temp(Src) 97.7 F (36.5 C)  Resp 16  Ht 5' 9.5" (1.765 m)  Wt 248 lb (112.492 kg)  BMI 36.11 kg/m2 Wt Readings from Last 3 Encounters:  06/01/14 248 lb (112.492 kg)  12/29/13 243 lb (110.224 kg)   General Appearance: Well nourished, in no apparent distress. Eyes: PERRLA, EOMs, conjunctiva no swelling or erythema Sinuses: No Frontal/maxillary tenderness ENT/Mouth: Ext aud canals clear, TMs without erythema, bulging. No erythema, swelling, or exudate on post pharynx.  Tonsils not swollen or erythematous. Hearing normal.  Neck: Supple, thyroid normal.  Respiratory: Respiratory effort normal, BS equal bilaterally without rales, rhonchi, wheezing or stridor.  Cardio: RRR with no MRGs. Brisk peripheral pulses without edema.  Abdomen: Soft, + BS.  Non tender, no guarding, rebound, hernias, masses. Lymphatics: Non tender without lymphadenopathy.  Musculoskeletal: Full ROM, 5/5 strength, normal gait.  Skin: Warm, dry without rashes, lesions, ecchymosis.  Neuro: Cranial nerves intact. No cerebellar symptoms. Sensation intact.  Psych: Awake and oriented X 3, normal affect, Insight and Judgment appropriate.    Vicie Mutters 11:55 AM

## 2014-06-02 LAB — HEPATIC FUNCTION PANEL
ALBUMIN: 4.4 g/dL (ref 3.5–5.2)
ALK PHOS: 69 U/L (ref 39–117)
ALT: 14 U/L (ref 0–53)
AST: 16 U/L (ref 0–37)
BILIRUBIN INDIRECT: 0.5 mg/dL (ref 0.2–1.2)
Bilirubin, Direct: 0.1 mg/dL (ref 0.0–0.3)
TOTAL PROTEIN: 7 g/dL (ref 6.0–8.3)
Total Bilirubin: 0.6 mg/dL (ref 0.2–1.2)

## 2014-06-02 LAB — BASIC METABOLIC PANEL WITH GFR
BUN: 16 mg/dL (ref 6–23)
CHLORIDE: 101 meq/L (ref 96–112)
CO2: 27 meq/L (ref 19–32)
CREATININE: 0.86 mg/dL (ref 0.50–1.35)
Calcium: 9.3 mg/dL (ref 8.4–10.5)
GFR, Est African American: >89 mL/min
GFR, Est Non African American: >89 mL/min
GLUCOSE: 145 mg/dL — AB (ref 70–99)
POTASSIUM: 4.6 meq/L (ref 3.5–5.3)
Sodium: 138 mEq/L (ref 135–145)

## 2014-06-02 LAB — HEMOGLOBIN A1C
Hgb A1c MFr Bld: 7.5 % — ABNORMAL HIGH (ref <5.7)
Mean Plasma Glucose: 169 mg/dL — ABNORMAL HIGH (ref <117)

## 2014-06-02 LAB — LIPID PANEL
Cholesterol: 171 mg/dL (ref 0–200)
HDL: 36 mg/dL — AB (ref >39)
LDL CALC: 114 mg/dL — AB (ref 0–99)
TRIGLYCERIDES: 103 mg/dL (ref <150)
Total CHOL/HDL Ratio: 4.8 Ratio
VLDL: 21 mg/dL (ref 0–40)

## 2014-06-02 LAB — TSH: TSH: 1.714 u[IU]/mL (ref 0.350–4.500)

## 2014-06-02 LAB — VITAMIN D 25 HYDROXY (VIT D DEFICIENCY, FRACTURES): Vit D, 25-Hydroxy: 19 ng/mL — ABNORMAL LOW (ref 30–89)

## 2014-06-02 LAB — MAGNESIUM: Magnesium: 1.6 mg/dL (ref 1.5–2.5)

## 2014-07-20 ENCOUNTER — Ambulatory Visit: Payer: Self-pay | Admitting: Physician Assistant

## 2014-08-14 ENCOUNTER — Other Ambulatory Visit: Payer: Self-pay | Admitting: Physician Assistant

## 2014-09-14 ENCOUNTER — Ambulatory Visit: Payer: Self-pay | Admitting: Physician Assistant

## 2014-10-19 ENCOUNTER — Other Ambulatory Visit: Payer: Self-pay | Admitting: Physician Assistant

## 2014-10-27 ENCOUNTER — Other Ambulatory Visit: Payer: Self-pay | Admitting: Internal Medicine

## 2014-11-27 ENCOUNTER — Other Ambulatory Visit: Payer: Self-pay | Admitting: Physician Assistant

## 2014-12-30 ENCOUNTER — Encounter: Payer: Self-pay | Admitting: Physician Assistant

## 2015-01-04 ENCOUNTER — Encounter: Payer: Self-pay | Admitting: Physician Assistant

## 2015-01-04 ENCOUNTER — Ambulatory Visit (INDEPENDENT_AMBULATORY_CARE_PROVIDER_SITE_OTHER): Payer: 59 | Admitting: Physician Assistant

## 2015-01-04 VITALS — BP 110/70 | HR 80 | Temp 97.9°F | Resp 16 | Ht 69.5 in | Wt 251.0 lb

## 2015-01-04 DIAGNOSIS — R05 Cough: Secondary | ICD-10-CM

## 2015-01-04 DIAGNOSIS — N521 Erectile dysfunction due to diseases classified elsewhere: Secondary | ICD-10-CM

## 2015-01-04 DIAGNOSIS — E1122 Type 2 diabetes mellitus with diabetic chronic kidney disease: Secondary | ICD-10-CM | POA: Insufficient documentation

## 2015-01-04 DIAGNOSIS — E291 Testicular hypofunction: Secondary | ICD-10-CM

## 2015-01-04 DIAGNOSIS — Z79899 Other long term (current) drug therapy: Secondary | ICD-10-CM

## 2015-01-04 DIAGNOSIS — R059 Cough, unspecified: Secondary | ICD-10-CM

## 2015-01-04 DIAGNOSIS — N182 Chronic kidney disease, stage 2 (mild): Secondary | ICD-10-CM

## 2015-01-04 DIAGNOSIS — R6889 Other general symptoms and signs: Secondary | ICD-10-CM

## 2015-01-04 DIAGNOSIS — F17200 Nicotine dependence, unspecified, uncomplicated: Secondary | ICD-10-CM

## 2015-01-04 DIAGNOSIS — Z23 Encounter for immunization: Secondary | ICD-10-CM

## 2015-01-04 DIAGNOSIS — E1169 Type 2 diabetes mellitus with other specified complication: Secondary | ICD-10-CM | POA: Insufficient documentation

## 2015-01-04 DIAGNOSIS — Z0001 Encounter for general adult medical examination with abnormal findings: Secondary | ICD-10-CM

## 2015-01-04 DIAGNOSIS — E119 Type 2 diabetes mellitus without complications: Secondary | ICD-10-CM

## 2015-01-04 DIAGNOSIS — I10 Essential (primary) hypertension: Secondary | ICD-10-CM

## 2015-01-04 DIAGNOSIS — Z125 Encounter for screening for malignant neoplasm of prostate: Secondary | ICD-10-CM

## 2015-01-04 DIAGNOSIS — J45909 Unspecified asthma, uncomplicated: Secondary | ICD-10-CM

## 2015-01-04 DIAGNOSIS — E669 Obesity, unspecified: Secondary | ICD-10-CM

## 2015-01-04 DIAGNOSIS — E785 Hyperlipidemia, unspecified: Secondary | ICD-10-CM

## 2015-01-04 DIAGNOSIS — E559 Vitamin D deficiency, unspecified: Secondary | ICD-10-CM

## 2015-01-04 LAB — BASIC METABOLIC PANEL WITH GFR
BUN: 15 mg/dL (ref 6–23)
CALCIUM: 9.6 mg/dL (ref 8.4–10.5)
CO2: 24 meq/L (ref 19–32)
Chloride: 99 mEq/L (ref 96–112)
Creat: 0.97 mg/dL (ref 0.50–1.35)
GFR, Est Non African American: 87 mL/min
GLUCOSE: 266 mg/dL — AB (ref 70–99)
Potassium: 5 mEq/L (ref 3.5–5.3)
Sodium: 134 mEq/L — ABNORMAL LOW (ref 135–145)

## 2015-01-04 LAB — CBC WITH DIFFERENTIAL/PLATELET
Basophils Absolute: 0 10*3/uL (ref 0.0–0.1)
Basophils Relative: 0 % (ref 0–1)
Eosinophils Absolute: 0.1 10*3/uL (ref 0.0–0.7)
Eosinophils Relative: 1 % (ref 0–5)
HCT: 46 % (ref 39.0–52.0)
HEMOGLOBIN: 15.5 g/dL (ref 13.0–17.0)
LYMPHS ABS: 2.5 10*3/uL (ref 0.7–4.0)
LYMPHS PCT: 28 % (ref 12–46)
MCH: 29.8 pg (ref 26.0–34.0)
MCHC: 33.7 g/dL (ref 30.0–36.0)
MCV: 88.3 fL (ref 78.0–100.0)
MPV: 9.5 fL (ref 8.6–12.4)
Monocytes Absolute: 0.6 10*3/uL (ref 0.1–1.0)
Monocytes Relative: 7 % (ref 3–12)
NEUTROS ABS: 5.8 10*3/uL (ref 1.7–7.7)
Neutrophils Relative %: 64 % (ref 43–77)
Platelets: 255 10*3/uL (ref 150–400)
RBC: 5.21 MIL/uL (ref 4.22–5.81)
RDW: 13.1 % (ref 11.5–15.5)
WBC: 9 10*3/uL (ref 4.0–10.5)

## 2015-01-04 LAB — HEPATIC FUNCTION PANEL
ALT: 16 U/L (ref 0–53)
AST: 13 U/L (ref 0–37)
Albumin: 4.2 g/dL (ref 3.5–5.2)
Alkaline Phosphatase: 74 U/L (ref 39–117)
BILIRUBIN DIRECT: 0.1 mg/dL (ref 0.0–0.3)
Indirect Bilirubin: 0.4 mg/dL (ref 0.2–1.2)
Total Bilirubin: 0.5 mg/dL (ref 0.2–1.2)
Total Protein: 7.1 g/dL (ref 6.0–8.3)

## 2015-01-04 LAB — LIPID PANEL
Cholesterol: 188 mg/dL (ref 0–200)
HDL: 34 mg/dL — ABNORMAL LOW (ref >=40)
LDL CALC: 126 mg/dL — AB (ref 0–99)
Total CHOL/HDL Ratio: 5.5 Ratio
Triglycerides: 140 mg/dL (ref <150)
VLDL: 28 mg/dL (ref 0–40)

## 2015-01-04 LAB — TSH: TSH: 2.049 u[IU]/mL (ref 0.350–4.500)

## 2015-01-04 LAB — HEMOGLOBIN A1C
Hgb A1c MFr Bld: 10.1 % — ABNORMAL HIGH (ref <5.7)
Mean Plasma Glucose: 243 mg/dL — ABNORMAL HIGH (ref <117)

## 2015-01-04 LAB — MAGNESIUM: MAGNESIUM: 1.5 mg/dL (ref 1.5–2.5)

## 2015-01-04 LAB — URIC ACID: URIC ACID, SERUM: 3.9 mg/dL — AB (ref 4.0–7.8)

## 2015-01-04 MED ORDER — NICOTINE 21 MG/24HR TD PT24: 21.0000 mg | MEDICATED_PATCH | TRANSDERMAL | Status: DC

## 2015-01-04 MED ORDER — BUPROPION HCL ER (XL) 150 MG PO TB24: 150.0000 mg | ORAL_TABLET | ORAL | Status: DC

## 2015-01-04 MED ORDER — ALBUTEROL SULFATE HFA 108 (90 BASE) MCG/ACT IN AERS: INHALATION_SPRAY | RESPIRATORY_TRACT | Status: DC

## 2015-01-04 MED ORDER — TADALAFIL 20 MG PO TABS: 20.0000 mg | ORAL_TABLET | Freq: Every day | ORAL | Status: DC | PRN

## 2015-01-04 NOTE — Progress Notes (Addendum)
Complete Physical  Assessment and Plan: 1. Essential hypertension - continue medications, DASH diet, exercise and monitor at home. Call if greater than 130/80.  - CBC with Differential/Platelet - BASIC METABOLIC PANEL WITH GFR - Hepatic function panel - TSH - Urinalysis, Routine w reflex microscopic - Microalbumin / creatinine urine ratio - EKG 12-Lead - [place], RETROPERITNL ABD,  LTD  2. Hyperlipidemia -continue medications, check lipids, decrease fatty foods, increase activity.  - Lipid panel  3. Diabetes mellitus without complication Discussed general issues about diabetes pathophysiology and management., Educational material distributed., Suggested low cholesterol diet., Encouraged aerobic exercise., Discussed foot care., Reminded to get yearly retinal exam. - Hemoglobin A1c - LOW EXTREMITY NEUR EXAM DOCUM  4. Obesity (BMI 30-39.9) Obesity with co morbidities- long discussion about weight loss, diet, and exercis - Uric acid  5. Hypogonadism in male - Testosterone Weight loss advised  6. Asthma, unspecified asthma severity, uncomplicated - albuterol (PROAIR HFA) 108 (90 BASE) MCG/ACT inhaler; USE 2 PUFFS BY MOUTH EVERY 4 HOURS AS NEEDED FOR WHEEZING AND SHORTNESS OF BREATH  Dispense: 18 g; Refill: 2 -stop smoking  7. Tobacco use disorder Smoking cessation-  instruction/counseling given, counseled patient on the dangers of tobacco use, advised patient to stop smoking, and reviewed strategies to maximize success, patient not ready to quit at this time.  - DG Chest 2 View; Future  8. Erectile dysfunction associated with type 2 diabetes mellitus Weight loss advised - tadalafil (CIALIS) 20 MG tablet; Take 1 tablet (20 mg total) by mouth daily as needed.  Dispense: 6 tablet; Refill: 3  9. Cough Smoking cessation advised ? ACE will stop for 2-4 weeks - DG Chest 2 View; Future  10. Need for prophylactic vaccination with combined diphtheria-tetanus-pertussis (DTP) vaccine -  Tdap vaccine greater than or equal to 7yo IM  11. Vitamin D deficiency - Vit D  25 hydroxy (rtn osteoporosis monitoring)  12. Medication management - Magnesium  13. Prostate cancer screening - PSA   Discussed med's effects and SE's. Screening labs and tests as requested with regular follow-up as recommended.  HPI 59 y.o. male  presents for a complete physical. His blood pressure has been controlled at home, today their BP is BP: 110/70 mmHg He does not workout. He denies chest pain, shortness of breath, dizziness.  He is on cholesterol medication, lovastatin 20mg  but admits to not taking it regularly and denies myalgias. His cholesterol is not at goal, less than 70. The cholesterol last visit was:   Lab Results  Component Value Date   CHOL 171 06/01/2014   HDL 36* 06/01/2014   LDLCALC 114* 06/01/2014   TRIG 103 06/01/2014   CHOLHDL 4.8 06/01/2014   He has not been working on diet and exercise for diabetes, and denies foot ulcerations, hypoglycemia , paresthesia of the feet, polydipsia, polyuria and visual disturbances. He is on ACE and on bASA and metformin. Tried invokana but could not tolerate due to  Last A1C in the office was:  Lab Results  Component Value Date   HGBA1C 7.5* 06/01/2014   Patient is on Vitamin D supplement.   Lab Results  Component Value Date   VD25OH 19* 06/01/2014  He is smoking and states that he would like to try the patch. 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 He has asthma and has been increasing his inhaler use.  BMI is Body mass index is 36.55 kg/(m^2)., he is working on diet and exercise. Due to his obesity/low testosterone he  has ED and uses cialis PRN.  Wt Readings from Last 3 Encounters:  01/04/15 251 lb (113.853 kg)  06/01/14 248 lb (112.492 kg)  12/29/13 243 lb (110.224 kg)   Lab Results  Component Value Date   PSA 0.29 12/29/2013    Current Medications:  Current Outpatient Prescriptions on File Prior to Visit  Medication Sig  Dispense Refill  . Cholecalciferol (VITAMIN D PO) Take 5,000 Int'l Units by mouth daily.    Marland Kitchen CIALIS 20 MG tablet TAKE 1 TABLET BY MOUTH EVERY DAY AS NEEDED 6 tablet 0  . enalapril (VASOTEC) 10 MG tablet Take 1 tablet (10 mg total) by mouth daily. 90 tablet 1  . lovastatin (MEVACOR) 20 MG tablet Take 1 tablet (20 mg total) by mouth at bedtime. 90 tablet 1  . metFORMIN (GLUCOPHAGE XR) 500 MG 24 hr tablet Take 2 tablets (1,000 mg total) by mouth 2 (two) times daily with a meal. 120 tablet 2  . PROAIR HFA 108 (90 BASE) MCG/ACT inhaler USE 2 PUFFS BY MOUTH EVERY 4 HOURS AS NEEDED FOR WHEEZING AND SHORTNESS OF BREATH 8.5 each 2   No current facility-administered medications on file prior to visit.    There is no immunization history on file for this patient.  Health Maintenance:  Tetanus: Needs Pneumovax: declines Prevnar 13: when 17 Flu vaccine: 07/2013 Zostavax: declines DEXA:N/A Colonoscopy: declines- long discussion, understands risk EGD: N/A  Eye Doctor: [facility]- looking for doctor, goes a year Dentist:None  Allergies:  Allergies  Allergen Reactions  . Flax [Bio-Flax]    Medical History:  Past Medical History  Diagnosis Date  . Hyperlipidemia   . Hypertension   . Asthma   . Erectile dysfunction   . Other testicular hypofunction   . Type II or unspecified type diabetes mellitus without mention of complication, not stated as uncontrolled    Surgical History: No past surgical history on file. Family History:  Family History  Problem Relation Age of Onset  . Stroke Mother   . Hypertension Mother   . Diabetes Mother   . Cancer Father     lung   Social History:   History  Substance Use Topics  . Smoking status: Current Every Day Smoker  . Smokeless tobacco: Not on file  . 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   Review of Systems  Constitutional: Negative.   HENT: Negative.   Eyes: Negative.   Respiratory: Negative.    Cardiovascular: Negative.   Gastrointestinal: Negative.   Genitourinary: Negative.   Musculoskeletal: Positive for myalgias and back pain. Negative for joint pain, falls and neck pain.  Skin: Negative.   Neurological: Negative.   Endo/Heme/Allergies: Negative.   Psychiatric/Behavioral: Negative for depression, suicidal ideas, hallucinations, memory loss and substance abuse. The patient has insomnia. The patient is not nervous/anxious.      Physical Exam: Estimated body mass index is 36.55 kg/(m^2) as calculated from the following:   Height as of this encounter: 5' 9.5" (1.765 m).   Weight as of this encounter: 251 lb (113.853 kg). Filed Vitals:   01/04/15 1031  BP: 110/70  Pulse: 80  Temp: 97.9 F (36.6 C)  Resp: 16   General Appearance: Well nourished, in no apparent distress. Eyes: PERRLA, EOMs, conjunctiva no swelling or erythema, normal fundi and vessels. Sinuses: No Frontal/maxillary tenderness ENT/Mouth: Ext aud canals clear, normal light reflex with TMs without erythema, bulging. Good dentition. No erythema, swelling, or exudate on post pharynx.  Tonsils not swollen or erythematous. Hearing normal.  Neck: Supple, thyroid normal. No bruits Respiratory: Respiratory effort normal, BS equal bilaterally without rales, rhonchi, wheezing or stridor. Cardio: RRR without murmurs, rubs or gallops. Brisk peripheral pulses without edema.  Chest: symmetric, with normal excursions and percussion. Abdomen: Soft, obese +BS. Non tender, no guarding, rebound, hernias, masses, or organomegaly. .  Lymphatics: Non tender without lymphadenopathy.  Genitourinary: defer Musculoskeletal: Full ROM all peripheral extremities,5/5 strength, and normal gait. Skin: Warm, dry without rashes, lesions, ecchymosis.  Neuro: Cranial nerves intact, reflexes equal bilaterally. Normal muscle tone, no cerebellar symptoms. Sensation intact.  Psych: Awake and oriented X 3, normal affect, Insight and Judgment  appropriate.   EKG: WNL no changes.   Vicie Mutters 10:48 AM

## 2015-01-04 NOTE — Patient Instructions (Addendum)
Benefiber is good for constipation/diarrhea/irritable bowel syndrome, it helps with weight loss and can help lower your bad cholesterol. Please do 1-2 TBSP in the morning in water, coffee, or tea. It can take up to a month before you can see a difference with your bowel movements. It is cheapest from [employer], [employer], [employer].    Diabetes is a very complicated disease...lets simplify it.  An easy way to look at it to understand the complications is if you think of the extra sugar floating in your blood stream as glass shards floating through your blood stream.    Diabetes affects your small vessels first: 1) The glass shards (sugar) scraps down the tiny blood vessels in your eyes and lead to diabetic retinopathy, the leading cause of blindness in the [place]. Diabetes is the leading cause of newly diagnosed adult (64 to 57 years of age) blindness in the [place].  2) The glass shards scratches down the tiny vessels of your legs leading to nerve damage called neuropathy and can lead to amputations of your feet. More than 60% of all non-traumatic amputations of lower limbs occur in people with diabetes.  3) Over time the small vessels in your brain are shredded and closed off, individually this does not cause any problems but over a long period of time many of the small vessels being blocked can lead to Vascular Dementia.   4) Your kidney's are a filter system and have a "net" that keeps certain things in the body and lets bad things out. Sugar shreds this net and leads to kidney damage and eventually failure. Decreasing the sugar that is destroying the net and certain blood pressure medications can help stop or decrease progression of kidney disease. Diabetes was the primary cause of kidney failure in 44 percent of all new cases in 2011.  5) Diabetes also destroys the small vessels in your penis that lead to erectile dysfunction. Eventually the vessels are so damaged that you may not be responsive to  cialis or viagra.   Diabetes and your large vessels: Your larger vessels consist of your coronary arteries in your heart and the carotid vessels to your brain. Diabetes or even increased sugars put you at 300% increased risk of heart attack and stroke and this is why.. The sugar scrapes down your large blood vessels and your body sees this as an internal injury and tries to repair itself. Just like you get a scab on your skin, your platelets will stick to the blood vessel wall trying to heal it. This is why we have diabetics on low dose aspirin daily, this prevents the platelets from sticking and can prevent plaque formation. In addition, your body takes cholesterol and tries to shove it into the open wound. This is why we want your LDL, or bad cholesterol, below 70.   The combination of platelets and cholesterol over 5-10 years forms plaque that can break off and cause a heart attack or stroke.   PLEASE REMEMBER:  Diabetes is preventable! Up to 2 percent of complications and morbidities among individuals with type 2 diabetes can be prevented, delayed, or effectively treated and minimized with regular visits to a health professional, appropriate monitoring and medication, and a healthy diet and lifestyle.  Before you even begin to attack a weight-loss plan, it pays to remember this: You are not fat. You have fat. Losing weight isn't about blame or shame; it's simply another achievement to accomplish. Dieting is like any other skill-you have to  buckle down and work at it. As long as you act in a smart, reasonable way, you'll ultimately get where you want to be. Here are some weight loss pearls for you.  1. It's Not a Diet. It's a Lifestyle Thinking of a diet as something you're on and suffering through only for the short term doesn't work. To shed weight and keep it off, you need to make permanent changes to the way you eat. It's OK to indulge occasionally, of course, but if you cut calories temporarily  and then revert to your old way of eating, you'll gain back the weight quicker than you can say yo-yo. Use it to lose it. Research shows that one of the best predictors of long-term weight loss is how many pounds you drop in the first month. For that reason, nutritionists often suggest being stricter for the first two weeks of your new eating strategy to build momentum. Cut out added sugar and alcohol and avoid unrefined carbs. After that, figure out how you can reincorporate them in a way that's healthy and maintainable.  2. There's a Right Way to Exercise Working out burns calories and fat and boosts your metabolism by building muscle. But those trying to lose weight are notorious for overestimating the number of calories they burn and underestimating the amount they take in. Unfortunately, your system is biologically programmed to hold on to extra pounds and that means when you start exercising, your body senses the deficit and ramps up its hunger signals. If you're not diligent, you'll eat everything you burn and then some. Use it to lose it. Cardio gets all the exercise glory, but strength and interval training are the real heroes. They help you build lean muscle, which in turn increases your metabolism and calorie-burning ability 3. Don't Overreact to Mild Hunger Some people have a hard time losing weight because of hunger anxiety. To them, being hungry is bad-something to be avoided at all costs-so they carry snacks with them and eat when they don't need to. Others eat because they're stressed out or bored. While you never want to get to the point of being ravenous (that's when bingeing is likely to happen), a hunger pang, a craving, or the fact that it's 3:00 p.m. should not send you racing for the vending machine or obsessing about the energy bar in your purse. Ideally, you should put off eating until your stomach is growling and it's difficult to concentrate.  Use it to lose it. When you feel the  urge to eat, use the HALT method. Ask yourself, Am I really hungry? Or am I angry or anxious, lonely or bored, or tired? If you're still not certain, try the apple test. If you're truly hungry, an apple should seem delicious; if it doesn't, something else is going on. Or you can try drinking water and making yourself busy, if you are still hungry try a healthy snack.  4. Not All Calories Are Created Equal The mechanics of weight loss are pretty simple: Take in fewer calories than you use for energy. But the kind of food you eat makes all the difference. Processed food that's high in saturated fat and refined starch or sugar can cause inflammation that disrupts the hormone signals that tell your brain you're full. The result: You eat a lot more.  Use it to lose it. Clean up your diet. Swap in whole, unprocessed foods, including vegetables, lean protein, and healthy fats that will fill you up and give you  the biggest nutritional bang for your calorie buck. In a few weeks, as your brain starts receiving regular hunger and fullness signals once again, you'll notice that you feel less hungry overall and naturally start cutting back on the amount you eat.  5. Protein, Produce, and Plant-Based Fats Are Your Weight-Loss Trinity Here's why eating the three Ps regularly will help you drop pounds. Protein fills you up. You need it to build lean muscle, which keeps your metabolism humming so that you can torch more fat. People in a weight-loss program who ate double the recommended daily allowance for protein (about 110 grams for a 150-pound woman) lost 70 percent of their weight from fat, while people who ate the RDA lost only about 40 percent, one study found. Produce is packed with filling fiber. "It's very difficult to consume too many calories if you're eating a lot of vegetables. Example: Three cups of broccoli is a lot of food, yet only 93 calories. (Fruit is another story. It can be easy to overeat and can  contain a lot of calories from sugar, so be sure to monitor your intake.) Plant-based fats like olive oil and those in avocados and nuts are healthy and extra satiating.  Use it to lose it. Aim to incorporate each of the three Ps into every meal and snack. People who eat protein throughout the day are able to keep weight off, according to a study in the [place] of Clinical Nutrition. In addition to meat, poultry and seafood, good sources are beans, lentils, eggs, tofu, and yogurt. As for fat, keep portion sizes in check by measuring out salad dressing, oil, and nut butters (shoot for one to two tablespoons). Finally, eat veggies or a little fruit at every meal. People who did that consumed 308 fewer calories but didn't feel any hungrier than when they didn't eat more produce.  7. How You Eat Is As Important As What You Eat In order for your brain to register that you're full, you need to focus on what you're eating. Sit down whenever you eat, preferably at a table. Turn off the TV or computer, put down your phone, and look at your food. Smell it. Chew slowly, and don't put another bite on your fork until you swallow. When women ate lunch this attentively, they consumed 30 percent less when snacking later than those who listened to an audiobook at lunchtime, according to a study in the [place] of Nutrition. 8. Weighing Yourself Really Works The scale provides the best evidence about whether your efforts are paying off. Seeing the numbers tick up or down or stagnate is motivation to keep going-or to rethink your approach. A 2015 study at [facility] found that daily weigh-ins helped people lose more weight, keep it off, and maintain that loss, even after two years. Use it to lose it. Step on the scale at the same time every day for the best results. If your weight shoots up several pounds from one weigh-in to the next, don't freak out. Eating a lot of salt the night before or having  your period is the likely culprit. The number should return to normal in a day or two. It's a steady climb that you need to do something about. 9. Too Much Stress and Too Little Sleep Are Your Enemies When you're tired and frazzled, your body cranks up the production of cortisol, the stress hormone that can cause carb cravings. Not getting enough sleep also boosts your levels of  ghrelin, a hormone associated with hunger, while suppressing leptin, a hormone that signals fullness and satiety. People on a diet who slept only five and a half hours a night for two weeks lost 55 percent less fat and were hungrier than those who slept eight and a half hours, according to a study in the [place]. Use it to lose it. Prioritize sleep, aiming for seven hours or more a night, which research shows helps lower stress. And make sure you're getting quality zzz's. If a snoring spouse or a fidgety cat wakes you up frequently throughout the night, you may end up getting the equivalent of just four hours of sleep, according to a study from [facility]. Keep pets out of the bedroom, and use a white-noise app to drown out snoring. 10. You Will Hit a plateau-And You Can Bust Through It As you slim down, your body releases much less leptin, the fullness hormone.  If you're not strength training, start right now. Building muscle can raise your metabolism to help you overcome a plateau. To keep your body challenged and burning calories, incorporate new moves and more intense intervals into your workouts or add another sweat session to your weekly routine. Alternatively, cut an extra 100 calories or so a day from your diet. Now that you've lost weight, your body simply doesn't need as much fuel.

## 2015-01-05 LAB — URINALYSIS, ROUTINE W REFLEX MICROSCOPIC
BILIRUBIN URINE: NEGATIVE
HGB URINE DIPSTICK: NEGATIVE
Ketones, ur: NEGATIVE mg/dL
Leukocytes, UA: NEGATIVE
Nitrite: NEGATIVE
Protein, ur: NEGATIVE mg/dL
Specific Gravity, Urine: >1.03 — ABNORMAL HIGH (ref 1.005–1.030)
Urobilinogen, UA: 0.2 mg/dL (ref 0.0–1.0)
pH: 5.5 (ref 5.0–8.0)

## 2015-01-05 LAB — URINALYSIS, MICROSCOPIC ONLY
CASTS: NONE SEEN
Crystals: NONE SEEN

## 2015-01-05 LAB — PSA: PSA: 0.22 ng/mL (ref <=4.00)

## 2015-01-05 LAB — MICROALBUMIN / CREATININE URINE RATIO
Creatinine, Urine: 229.9 mg/dL
Microalb Creat Ratio: 5.2 mg/g (ref 0.0–30.0)
Microalb, Ur: 1.2 mg/dL (ref <2.0)

## 2015-01-05 LAB — TESTOSTERONE: TESTOSTERONE: 216 ng/dL — AB (ref 300–890)

## 2015-01-05 LAB — VITAMIN D 25 HYDROXY (VIT D DEFICIENCY, FRACTURES): Vit D, 25-Hydroxy: 12 ng/mL — ABNORMAL LOW (ref 30–100)

## 2015-01-27 ENCOUNTER — Telehealth: Payer: Self-pay

## 2015-01-27 ENCOUNTER — Other Ambulatory Visit: Payer: Self-pay | Admitting: Internal Medicine

## 2015-01-27 ENCOUNTER — Other Ambulatory Visit: Payer: Self-pay

## 2015-01-27 DIAGNOSIS — E119 Type 2 diabetes mellitus without complications: Secondary | ICD-10-CM

## 2015-01-27 MED ORDER — GLIMEPIRIDE 4 MG PO TABS: ORAL_TABLET | ORAL | Status: DC

## 2015-01-27 MED ORDER — GLUCOSE BLOOD VI STRP: 1.0000 | ORAL_STRIP | Status: DC | PRN

## 2015-01-27 NOTE — Telephone Encounter (Signed)
Patient called today with complaints regarding the Metformin, states that the Metformin is causing nausea and diarrhea. Wanted to know if we could change his medicine for him. Per Dr Melford Aase I returned call to patient and advised him that Dr Melford Aase called in Amaryl 4 mg 1/2 to 1 tablet twice daily with meals. This is in addition to the Metformin which he should attempt to take one daily for approx one week, move to 2 daily with meals. He was advised that the goal is to be on the Amaryl only long enough to work his way up to 2 metformin daily. He was also warned to be careful with the Amaryl as it can cause your blood sugars to drop and that he should make sure he takes with a meal. Patient was not happy with suggestion, he wanted medication changed and did not see why we could not just change the medication. We agreed that he would attempt one Metformin daily in addition to the Amaryl and he should call back in one week with update. Patient also commented that he never got to see a "real doctor" I assured him that the P.A.s were well qualified to treat him. He has a follow up in June with Dr Melford Aase.

## 2015-03-31 ENCOUNTER — Other Ambulatory Visit: Payer: Self-pay | Admitting: Physician Assistant

## 2015-04-05 ENCOUNTER — Other Ambulatory Visit: Payer: Self-pay | Admitting: Physician Assistant

## 2015-04-12 ENCOUNTER — Ambulatory Visit: Payer: Self-pay | Admitting: Physician Assistant

## 2015-04-12 ENCOUNTER — Ambulatory Visit (INDEPENDENT_AMBULATORY_CARE_PROVIDER_SITE_OTHER): Payer: 59 | Admitting: Internal Medicine

## 2015-04-12 ENCOUNTER — Encounter: Payer: Self-pay | Admitting: Internal Medicine

## 2015-04-12 ENCOUNTER — Ambulatory Visit: Payer: Self-pay | Admitting: Internal Medicine

## 2015-04-12 VITALS — BP 124/74 | HR 60 | Temp 98.1°F | Resp 16 | Ht 69.5 in | Wt 241.0 lb

## 2015-04-12 DIAGNOSIS — E291 Testicular hypofunction: Secondary | ICD-10-CM

## 2015-04-12 DIAGNOSIS — E669 Obesity, unspecified: Secondary | ICD-10-CM

## 2015-04-12 DIAGNOSIS — I10 Essential (primary) hypertension: Secondary | ICD-10-CM

## 2015-04-12 DIAGNOSIS — E785 Hyperlipidemia, unspecified: Secondary | ICD-10-CM

## 2015-04-12 DIAGNOSIS — Z79899 Other long term (current) drug therapy: Secondary | ICD-10-CM

## 2015-04-12 DIAGNOSIS — E559 Vitamin D deficiency, unspecified: Secondary | ICD-10-CM | POA: Insufficient documentation

## 2015-04-12 DIAGNOSIS — E119 Type 2 diabetes mellitus without complications: Secondary | ICD-10-CM

## 2015-04-12 LAB — CBC WITH DIFFERENTIAL/PLATELET
Basophils Absolute: 0 10*3/uL (ref 0.0–0.1)
Basophils Relative: 0 % (ref 0–1)
EOS ABS: 0.2 10*3/uL (ref 0.0–0.7)
EOS PCT: 2 % (ref 0–5)
HCT: 41.8 % (ref 39.0–52.0)
Hemoglobin: 14.2 g/dL (ref 13.0–17.0)
Lymphocytes Relative: 28 % (ref 12–46)
Lymphs Abs: 2.6 10*3/uL (ref 0.7–4.0)
MCH: 29.2 pg (ref 26.0–34.0)
MCHC: 34 g/dL (ref 30.0–36.0)
MCV: 86 fL (ref 78.0–100.0)
MONOS PCT: 8 % (ref 3–12)
MPV: 9.5 fL (ref 8.6–12.4)
Monocytes Absolute: 0.7 10*3/uL (ref 0.1–1.0)
Neutro Abs: 5.8 10*3/uL (ref 1.7–7.7)
Neutrophils Relative %: 62 % (ref 43–77)
Platelets: 224 10*3/uL (ref 150–400)
RBC: 4.86 MIL/uL (ref 4.22–5.81)
RDW: 13.8 % (ref 11.5–15.5)
WBC: 9.3 10*3/uL (ref 4.0–10.5)

## 2015-04-12 LAB — LIPID PANEL
CHOL/HDL RATIO: 3.5 ratio
CHOLESTEROL: 102 mg/dL (ref 0–200)
HDL: 29 mg/dL — AB (ref >=40)
LDL CALC: 61 mg/dL (ref 0–99)
TRIGLYCERIDES: 60 mg/dL (ref <150)
VLDL: 12 mg/dL (ref 0–40)

## 2015-04-12 LAB — HEPATIC FUNCTION PANEL
ALT: 8 U/L (ref 0–53)
AST: 12 U/L (ref 0–37)
Albumin: 4.4 g/dL (ref 3.5–5.2)
Alkaline Phosphatase: 75 U/L (ref 39–117)
BILIRUBIN DIRECT: 0.1 mg/dL (ref 0.0–0.3)
BILIRUBIN TOTAL: 0.5 mg/dL (ref 0.2–1.2)
Indirect Bilirubin: 0.4 mg/dL (ref 0.2–1.2)
Total Protein: 7.1 g/dL (ref 6.0–8.3)

## 2015-04-12 LAB — BASIC METABOLIC PANEL WITH GFR
BUN: 15 mg/dL (ref 6–23)
CHLORIDE: 104 meq/L (ref 96–112)
CO2: 27 mEq/L (ref 19–32)
Calcium: 9.5 mg/dL (ref 8.4–10.5)
Creat: 1.01 mg/dL (ref 0.50–1.35)
GFR, EST NON AFRICAN AMERICAN: 82 mL/min
Glucose, Bld: 87 mg/dL (ref 70–99)
Potassium: 4.8 mEq/L (ref 3.5–5.3)
SODIUM: 140 meq/L (ref 135–145)

## 2015-04-12 LAB — HEMOGLOBIN A1C
HEMOGLOBIN A1C: 6 % — AB (ref <5.7)
Mean Plasma Glucose: 126 mg/dL — ABNORMAL HIGH (ref <117)

## 2015-04-12 LAB — TSH: TSH: 2.68 u[IU]/mL (ref 0.350–4.500)

## 2015-04-12 LAB — MAGNESIUM: Magnesium: 1.8 mg/dL (ref 1.5–2.5)

## 2015-04-12 NOTE — Patient Instructions (Addendum)
Recommend Adult Low dose Aspirin or baby Aspirin 81 mg daily   To reduce risk of Colon Cancer 20 %,   Skin Cancer 26 % ,   Melanoma 46%   and   Pancreatic cancer 60%  ++++++++++++++++++  Vitamin D goal is between 70-100.   Please make sure that you are taking your Vitamin D as directed.   It is very important as a natural anti-inflammatory   helping hair, skin, and nails, as well as reducing stroke and heart attack risk.   It helps your bones and helps with mood.  It also decreases numerous cancer risks so please take it as directed.   Low Vit D is associated with a 200-300% higher risk for CANCER   and 200-300% higher risk for HEART   ATTACK  &  STROKE.    .....................................Marland Kitchen  It is also associated with higher death rate at younger ages,   autoimmune diseases like Rheumatoid arthritis, Lupus, Multiple Sclerosis.     Also many other serious conditions, like depression, Alzheimer's  Dementia, infertility, muscle aches, fatigue, fibromyalgia - just to name a few.  +++++++++++++++++++    Recommend the book "The END of DIETING" by Dr Excell Seltzer   & the book "The END of DIABETES " by Dr Excell Seltzer  At [email or web address] - get book & Audio CD's     Being diabetic has a  300% increased risk for heart attack, stroke, cancer, and alzheimer- type vascular dementia. It is very important that you work harder with diet by avoiding all foods that are white. Avoid white rice (brown & wild rice is OK), white potatoes (sweetpotatoes in moderation is OK), White bread or wheat bread or anything made out of white flour like bagels, donuts, rolls, buns, biscuits, cakes, pastries, cookies, pizza crust, and pasta (made from white flour & egg whites) - vegetarian pasta or spinach or wheat pasta is OK. Multigrain breads like Arnold's or Pepperidge Farm, or multigrain sandwich thins or flatbreads.  Diet, exercise and weight loss can reverse and cure diabetes in the early  stages.  Diet,  and weight loss is very important in the control and prevention of complications of diabetes which affects every system in your body, ie. Brain - dementia/stroke, eyes - glaucoma/blindness, heart - heart attack/heart failure, kidneys - dialysis, stomach - gastric paralysis, intestines - malabsorption, nerves - severe painful neuritis, circulation - gangrene & loss of a leg(s), and finally cancer and Alzheimers.    I recommend avoid fried & greasy foods,  sweets/candy, white rice (brown or wild rice or Quinoa is OK), white potatoes (sweet potatoes are OK) - anything made from white flour - bagels, doughnuts, rolls, buns, biscuits,white and wheat breads, pizza crust and traditional pasta made of white flour & egg white(vegetarian pasta or spinach or wheat pasta is OK).  Multi-grain bread is OK - like multi-grain flat bread or sandwich thins. Avoid alcohol in excess. Exercise is also important.    Eat all the vegetables you want - avoid meat, especially red meat and dairy - especially cheese.  Cheese is the most concentrated form of trans-fats which is the worst thing to clog up our arteries. Veggie cheese is OK which can be found in the fresh produce section at [employer] or [employer] or [employer]  ++++++++++++++++++++++++++  9 Ways to Naturally Increase Testosterone Levels  1.   Lose Weight  If you're overweight, shedding the excess pounds may increase your testosterone levels, according to research presented at the  Endocrine Society's 2012 meeting. Overweight men are more likely to have low testosterone levels to begin with, so this is an important trick to increase your body's testosterone production when you need it most.  2.   High-Intensity Exercise like Peak Fitness   Short intense exercise has a proven positive effect on increasing testosterone levels and preventing its decline. That's unlike aerobics or prolonged moderate exercise, which have shown to have negative or no  effect on testosterone levels. Having a whey protein meal after exercise can further enhance the satiety/testosterone-boosting impact (hunger hormones cause the opposite effect on your testosterone and libido). Here's a summary of what a typical high-intensity Peak Fitness routine might look like: " Warm up for three minutes  " Exercise as hard and fast as you can for 30 seconds. You should feel like you couldn't possibly go on another few seconds  " Recover at a slow to moderate pace for 90 seconds  " Repeat the high intensity exercise and recovery 7 more times .  3.   Consume Plenty of Zinc  The mineral zinc is important for testosterone production, and supplementing your diet for as little as six weeks has been shown to cause a marked improvement in testosterone among men with low levels.1 Likewise, research has shown that restricting dietary sources of zinc leads to a significant decrease in testosterone, while zinc supplementation increases it2 -- and even protects men from exercised-induced reductions in testosterone levels.3 It's estimated that up to 36 percent of adults over the age of 60 may have lower than recommended zinc intakes; even when dietary supplements were added in, an estimated 20-25 percent of older adults still had inadequate zinc intakes, according to a [employer] and Nutrition Examination Survey.4 Your diet is the best source of zinc; along with protein-rich foods like meats and fish, other good dietary sources of zinc include raw milk, raw cheese, beans, and yogurt or kefir made from raw milk. It can be difficult to obtain enough dietary zinc if you're a vegetarian, and also for meat-eaters as well, largely because of conventional farming methods that rely heavily on chemical fertilizers and pesticides. These chemicals deplete the soil of nutrients ... nutrients like zinc that must be absorbed by plants in order to be passed on to you. In many cases, you may further deplete  the nutrients in your food by the way you prepare it. For most food, cooking it will drastically reduce its levels of nutrients like zinc . particularly over-cooking, which many people do. If you decide to use a zinc supplement, stick to a dosage of less than 40 mg a day, as this is the recommended adult upper limit. Taking too much zinc can interfere with your body's ability to absorb other minerals, especially copper, and may cause nausea as a side effect.  4.   Strength Training  In addition to [employer] Fitness, strength training is also known to boost testosterone levels, provided you are doing so intensely enough. When strength training to boost testosterone, you'll want to increase the weight and lower your number of reps, and then focus on exercises that work a large number of muscles, such as dead lifts or squats.  You can "turbo-charge" your weight training by going slower. By slowing down your movement, you're actually turning it into a high-intensity exercise. Super Slow movement allows your muscle, at the microscopic level, to access the maximum number of cross-bridges between the protein filaments that produce movement in the muscle.  5.   Optimize Your Vitamin D Levels  Vitamin D, a steroid hormone, is essential for the healthy development of the nucleus of the sperm cell, and helps maintain semen quality and sperm count. Vitamin D also increases levels of testosterone, which may boost libido. In one study, overweight men who were given vitamin D supplements had a significant increase in testosterone levels after one year.5   6.   Reduce Stress  When you're under a lot of stress, your body releases high levels of the stress hormone cortisol. This hormone actually blocks the effects of testosterone,6 presumably because, from a biological standpoint, testosterone-associated behaviors (mating, competing, aggression) may have lowered your chances of survival in an emergency (hence, the "fight or  flight" response is dominant, courtesy of cortisol).  7.   Limit or Eliminate Sugar from Your Diet  Testosterone levels decrease after you eat sugar, which is likely because the sugar leads to a high insulin level, another factor leading to low testosterone.7 Based on USDA estimates, the average American consumes 12 teaspoons of sugar a day, which equates to about TWO TONS of sugar during a lifetime.  8.   Eat Healthy Fats  By healthy, this means not only mono- and polyunsaturated fats, like that found in avocadoes and nuts, but also saturated, as these are essential for building testosterone. Research shows that a diet with less than 40 percent of energy as fat (and that mainly from animal sources, i.e. saturated) lead to a decrease in testosterone levels. ie eat less animal products - as Meat , poultry and dairy. Experts agree that the ideal diet includes somewhere between 50-70 percent fat.  It's important to understand that your body requires saturated fats from animal and vegetable sources (such as meat, dairy, certain oils, and tropical plants like coconut) for optimal functioning, and if you neglect this important food group in favor of sugar, grains and other starchy carbs, your health and weight are almost guaranteed to suffer. Examples of healthy fats you can eat more of to give your testosterone levels a boost include: Olives and Olive oil  Coconuts and coconut oil Butter made from raw grass-fed organic milk Raw nuts, such as, almonds or pecans Organic pastured egg yolks Avocados Grass-fed meats Palm oil Unheated organic nut oils   9.   Boost Your Intake of Branch Chain Amino Acids (BCAA) from Foods Like [place] suggests that BCAAs result in higher testosterone levels, particularly when taken along with resistance training. While BCAAs are available in supplement form, you'll find the highest concentrations of BCAAs like leucine in whey protein. Even when getting leucine  from your natural food supply, it's often wasted or used as a building block instead of an anabolic agent. So to create the correct anabolic environment, you need to boost leucine consumption way beyond mere maintenance levels. That said, keep in mind that using leucine as a free form amino acid can be highly counterproductive as when free form amino acids are artificially administrated, they rapidly enter your circulation while disrupting insulin function, and impairing your body's glycemic control. Food-based leucine is really the ideal form that can benefit your muscles without side effects.

## 2015-04-12 NOTE — Addendum Note (Signed)
Addended by: Vicie Mutters R on: 04/12/2015 10:32 AM   Modules accepted: Miquel Dunn

## 2015-04-12 NOTE — Progress Notes (Signed)
Patient ID: Luis Bonilla, male   DOB: 03/12/1956, 59 y.o.   MRN: [id number]   This very nice 59 y.o. MWM presents for  follow up with Hypertension, Hyperlipidemia, Pre-Diabetes and Vitamin D Deficiency.    Patient is treated for HTN since 2011 with Enalapril which he recently d/c'd due to cough and cough resolved. Today's BP: 124/74 mmHg. Patient has had no complaints of any cardiac type chest pain, palpitations, dyspnea/orthopnea/PND, dizziness, claudication, or dependent edema.   Hyperlipidemia is not controlled with diet & meds. Patient denies myalgias or other med SE's. Last Lipids were Chol 188; HDL 34*; LDL  126; and with elevated Triglycerides 140 on 01/04/2015.   Also, the patient has history of T2_NIDDM and has had no symptoms of reactive hypoglycemia, diabetic polys, paresthesias or visual blurring. 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  Last A1c was 10.1% on 01/04/2015. Since then he was started on Glimiperide 4 mg x 1/2 tab 2 x/daily and is apparently tolerating # 1 MF/day. He He reports a recent Home A1c test was 6.1% and that he has lost 10# weight with improved diet over the last 3 months.    Further, the patient also has history of Vitamin D Deficiency and supplements vitamin D without any suspected side-effects. Last vitamin D was "12" on 01/04/2015.  Medication Sig  . albuterol  HFA  inhaler USE 2 PUFFS BY MOUTH EVERY 4 HOURS AS NEEDED FOR WHEEZING AND SHORTNESS OF BREATH  . buPROPion  XL 150 MG 24 hr tablet TAKE 1 TABLET (150 MG TOTAL) BY MOUTH EVERY MORNING.  . VITAMIN D  Take 5,000 Int'l Units by mouth daily.  . enalapril 10 MG tablet Take 1 tablet (10 mg total) by mouth daily.  Marland Kitchen glimepiride  4 MG tablet Take 1/2 to 1 tab 2 x daily with a meal for diabetes.  . lovastatin (MEVACOR) 20 MG tablet Take 1 tablet (20 mg total) by mouth at bedtime.  . metFORMIN - XR 500 MG 24 hr tablet Take 2 tablets (1,000 mg total)  by mouth 2 (two) times daily with a meal.  . NICODERM CQ -  21 mg/24hr patch Place 1 patch (21 mg total) onto the skin daily.  . tadalafil  20 MG tablet Take 1 tablet (20 mg total) by mouth daily as needed.   Allergies  Allergen Reactions  . Flax [Bio-Flax]    PMHx:   Past Medical History  Diagnosis Date  . Hyperlipidemia   . Hypertension   . Asthma   . Erectile dysfunction   . Other testicular hypofunction   . Type II or unspecified type diabetes mellitus without mention of complication, not stated as uncontrolled    Immunization History  Administered Date(s) Administered  . Tdap 01/04/2015   FHx:    Reviewed / unchanged  SHx:    Reviewed / unchanged  Systems Review:  Constitutional: Denies fever, chills, wt changes, headaches, insomnia, fatigue, night sweats, change in appetite. Eyes: Denies redness, blurred vision, diplopia, discharge, itchy, watery eyes.  ENT: Denies discharge, congestion, post nasal drip, epistaxis, sore throat, earache, hearing loss, dental pain, tinnitus, vertigo, sinus pain, snoring.  CV: Denies chest pain, palpitations, irregular heartbeat, syncope, dyspnea, diaphoresis, orthopnea, PND, claudication or edema. Respiratory: denies cough, dyspnea, DOE, pleurisy, hoarseness, laryngitis, wheezing.  Gastrointestinal: Denies dysphagia, odynophagia, heartburn, reflux, water brash, abdominal pain or cramps, nausea, vomiting, bloating, diarrhea, constipation, hematemesis, melena,  hematochezia  or hemorrhoids. Genitourinary: Denies dysuria, frequency, urgency, nocturia, hesitancy, discharge, hematuria or flank pain. Musculoskeletal: Denies arthralgias, myalgias, stiffness, jt. swelling, pain, limping or strain/sprain.  Skin: Denies pruritus, rash, hives, warts, acne, eczema or change in skin lesion(s). Neuro: No weakness, tremor, incoordination, spasms, paresthesia or pain. Psychiatric: Denies confusion, memory loss or sensory loss. Endo: Denies change in weight,  skin or hair change.  Heme/Lymph: No excessive bleeding, bruising or enlarged lymph nodes.  Physical Exam  BP 124/74   Pulse 60  Temp 98.1 F   Resp 16  Ht 5' 9.5"   Wt 241 lb     BMI 35.09   Appears well nourished and in no distress. Eyes: PERRLA, EOMs, conjunctiva no swelling or erythema. Sinuses: No frontal/maxillary tenderness ENT/Mouth: EAC's clear, TM's nl w/o erythema, bulging. Nares clear w/o erythema, swelling, exudates. Oropharynx clear without erythema or exudates. Oral hygiene is good. Tongue normal, non obstructing. Hearing intact.  Neck: Supple. Thyroid nl. Car 2+/2+ without bruits, nodes or JVD. Chest: Respirations nl with BS clear & equal w/o rales, rhonchi, wheezing or stridor.  Cor: Heart sounds normal w/ regular rate and rhythm without sig. murmurs, gallops, clicks, or rubs. Peripheral pulses normal and equal  without edema.  Abdomen: Soft & bowel sounds normal. Non-tender w/o guarding, rebound, hernias, masses, or organomegaly.  Lymphatics: Unremarkable.  Musculoskeletal: Full ROM all peripheral extremities, joint stability, 5/5 strength, and normal gait.  Skin: Warm, dry without exposed rashes, lesions or ecchymosis apparent.  Neuro: Cranial nerves intact, reflexes equal bilaterally. Sensory-motor testing grossly intact. Tendon reflexes grossly intact.  Pysch: Alert & oriented x 3.  Insight and judgement nl & appropriate. No ideations.  Assessment and Plan:  1. Essential hypertension  -advised more frequent BP monitoring since off Tx  - TSH  2. Hyperlipidemia  - Lipid panel  3. Diabetes mellitus without complication  - Hemoglobin A1c - Insulin, random  4. Vitamin D deficiency  - Vit D  25 hydroxy (rtn osteoporosis monitoring)  5. Obesity (BMI 30-39.9)   6. Hypogonadism in male  - Testosterone  7. Medication management  - CBC with Differential/Platelet - BASIC METABOLIC PANEL WITH GFR - Hepatic function panel - Magnesium   Recommended  regular exercise, BP monitoring, weight control, and discussed med and SE's. Recommended labs to assess and monitor clinical status. Further disposition pending results of labs. Over 30 minutes of exam, counseling, chart review was performed

## 2015-04-13 LAB — TESTOSTERONE: Testosterone: 211 ng/dL — ABNORMAL LOW (ref 300–890)

## 2015-04-13 LAB — INSULIN, RANDOM: INSULIN: 10.2 u[IU]/mL (ref 2.0–19.6)

## 2015-04-13 LAB — VITAMIN D 25 HYDROXY (VIT D DEFICIENCY, FRACTURES): Vit D, 25-Hydroxy: 52 ng/mL (ref 30–100)

## 2015-05-11 ENCOUNTER — Other Ambulatory Visit: Payer: Self-pay | Admitting: Internal Medicine

## 2015-05-17 ENCOUNTER — Other Ambulatory Visit: Payer: Self-pay | Admitting: Physician Assistant

## 2015-06-13 ENCOUNTER — Other Ambulatory Visit: Payer: Self-pay | Admitting: Physician Assistant

## 2015-06-17 ENCOUNTER — Other Ambulatory Visit: Payer: Self-pay | Admitting: Physician Assistant

## 2015-06-23 ENCOUNTER — Other Ambulatory Visit: Payer: Self-pay | Admitting: Physician Assistant

## 2015-07-19 ENCOUNTER — Encounter: Payer: Self-pay | Admitting: Physician Assistant

## 2015-07-19 ENCOUNTER — Ambulatory Visit (INDEPENDENT_AMBULATORY_CARE_PROVIDER_SITE_OTHER): Payer: 59 | Admitting: Physician Assistant

## 2015-07-19 VITALS — BP 162/96 | HR 64 | Temp 98.9°F | Resp 16 | Ht 69.5 in | Wt 243.0 lb

## 2015-07-19 DIAGNOSIS — E559 Vitamin D deficiency, unspecified: Secondary | ICD-10-CM | POA: Diagnosis not present

## 2015-07-19 DIAGNOSIS — F17200 Nicotine dependence, unspecified, uncomplicated: Secondary | ICD-10-CM

## 2015-07-19 DIAGNOSIS — I10 Essential (primary) hypertension: Secondary | ICD-10-CM | POA: Diagnosis not present

## 2015-07-19 DIAGNOSIS — J45909 Unspecified asthma, uncomplicated: Secondary | ICD-10-CM

## 2015-07-19 DIAGNOSIS — Z79899 Other long term (current) drug therapy: Secondary | ICD-10-CM

## 2015-07-19 DIAGNOSIS — E669 Obesity, unspecified: Secondary | ICD-10-CM | POA: Diagnosis not present

## 2015-07-19 DIAGNOSIS — E785 Hyperlipidemia, unspecified: Secondary | ICD-10-CM | POA: Diagnosis not present

## 2015-07-19 DIAGNOSIS — E119 Type 2 diabetes mellitus without complications: Secondary | ICD-10-CM | POA: Diagnosis not present

## 2015-07-19 LAB — HEPATIC FUNCTION PANEL
ALBUMIN: 4.3 g/dL (ref 3.6–5.1)
ALT: 11 U/L (ref 9–46)
AST: 14 U/L (ref 10–35)
Alkaline Phosphatase: 71 U/L (ref 40–115)
BILIRUBIN INDIRECT: 0.3 mg/dL (ref 0.2–1.2)
Bilirubin, Direct: 0.1 mg/dL (ref <=0.2)
TOTAL PROTEIN: 7.1 g/dL (ref 6.1–8.1)
Total Bilirubin: 0.4 mg/dL (ref 0.2–1.2)

## 2015-07-19 LAB — BASIC METABOLIC PANEL WITH GFR
BUN: 18 mg/dL (ref 7–25)
CO2: 27 mmol/L (ref 20–31)
Calcium: 9.6 mg/dL (ref 8.6–10.3)
Chloride: 103 mmol/L (ref 98–110)
Creat: 1.05 mg/dL (ref 0.70–1.33)
GFR, EST NON AFRICAN AMERICAN: 78 mL/min (ref >=60)
Glucose, Bld: 78 mg/dL (ref 65–99)
POTASSIUM: 4.9 mmol/L (ref 3.5–5.3)
SODIUM: 139 mmol/L (ref 135–146)

## 2015-07-19 LAB — CBC WITH DIFFERENTIAL/PLATELET
BASOS PCT: 0 % (ref 0–1)
Basophils Absolute: 0 10*3/uL (ref 0.0–0.1)
Eosinophils Absolute: 0.1 10*3/uL (ref 0.0–0.7)
Eosinophils Relative: 1 % (ref 0–5)
HEMATOCRIT: 44.8 % (ref 39.0–52.0)
HEMOGLOBIN: 15.1 g/dL (ref 13.0–17.0)
LYMPHS PCT: 34 % (ref 12–46)
Lymphs Abs: 2.7 10*3/uL (ref 0.7–4.0)
MCH: 29 pg (ref 26.0–34.0)
MCHC: 33.7 g/dL (ref 30.0–36.0)
MCV: 86.2 fL (ref 78.0–100.0)
MONO ABS: 0.7 10*3/uL (ref 0.1–1.0)
MPV: 9.5 fL (ref 8.6–12.4)
Monocytes Relative: 9 % (ref 3–12)
NEUTROS ABS: 4.4 10*3/uL (ref 1.7–7.7)
Neutrophils Relative %: 56 % (ref 43–77)
Platelets: 212 10*3/uL (ref 150–400)
RBC: 5.2 MIL/uL (ref 4.22–5.81)
RDW: 13.8 % (ref 11.5–15.5)
WBC: 7.9 10*3/uL (ref 4.0–10.5)

## 2015-07-19 LAB — LIPID PANEL
CHOLESTEROL: 159 mg/dL (ref 125–200)
HDL: 34 mg/dL — ABNORMAL LOW (ref >=40)
LDL Cholesterol: 109 mg/dL (ref <130)
TRIGLYCERIDES: 81 mg/dL (ref <150)
Total CHOL/HDL Ratio: 4.7 Ratio (ref <=5.0)
VLDL: 16 mg/dL (ref <30)

## 2015-07-19 LAB — TSH: TSH: 2.477 u[IU]/mL (ref 0.350–4.500)

## 2015-07-19 LAB — MAGNESIUM: MAGNESIUM: 1.9 mg/dL (ref 1.5–2.5)

## 2015-07-19 LAB — HEMOGLOBIN A1C
Hgb A1c MFr Bld: 6.1 % — ABNORMAL HIGH (ref <5.7)
Mean Plasma Glucose: 128 mg/dL — ABNORMAL HIGH (ref <117)

## 2015-07-19 MED ORDER — METFORMIN HCL ER 500 MG PO TB24: 1000.0000 mg | ORAL_TABLET | Freq: Two times a day (BID) | ORAL | Status: DC

## 2015-07-19 MED ORDER — LOSARTAN POTASSIUM 50 MG PO TABS: 50.0000 mg | ORAL_TABLET | Freq: Every day | ORAL | Status: DC

## 2015-07-19 MED ORDER — GLIMEPIRIDE 4 MG PO TABS: ORAL_TABLET | ORAL | Status: DC

## 2015-07-19 MED ORDER — BUDESONIDE-FORMOTEROL FUMARATE 160-4.5 MCG/ACT IN AERO: 2.0000 | INHALATION_SPRAY | Freq: Two times a day (BID) | RESPIRATORY_TRACT | Status: DC

## 2015-07-19 MED ORDER — TADALAFIL 20 MG PO TABS
ORAL_TABLET | ORAL | Status: DC
Start: 2015-07-19 — End: 2016-03-13

## 2015-07-19 MED ORDER — BUPROPION HCL ER (XL) 150 MG PO TB24: ORAL_TABLET | ORAL | Status: DC

## 2015-07-19 MED ORDER — LOVASTATIN 20 MG PO TABS: 20.0000 mg | ORAL_TABLET | Freq: Every day | ORAL | Status: DC

## 2015-07-19 NOTE — Patient Instructions (Signed)
    Bad carbs also include fruit juice, alcohol, and sweet tea. These are empty calories that do not signal to your brain that you are full.   Please remember the good carbs are still carbs which convert into sugar. So please measure them out no more than 1/2-1 cup of rice, oatmeal, pasta, and beans  Veggies are however free foods! Pile them on.   Not all fruit is created equal. Please see the list below, the fruit at the bottom is higher in sugars than the fruit at the top. Please avoid all dried fruits.     We want weight loss that will last so you should lose 1-2 pounds a week.  THAT IS IT! Please pick THREE things a month to change. Once it is a habit check off the item. Then pick another three items off the list to become habits.  If you are already doing a habit on the list GREAT!  Cross that item off! o Don't drink your calories. Ie, alcohol, soda, fruit juice, and sweet tea.  o Drink more water. Drink a glass when you feel hungry or before each meal.  o Eat breakfast - Complex carb and protein (likeDannon light and fit yogurt, oatmeal, fruit, eggs, turkey bacon). o Measure your cereal.  Eat no more than one cup a day. (ie Kashi) o Eat an apple a day. o Add a vegetable a day. o Try a new vegetable a month. o Use Pam! Stop using oil or butter to cook. o Don't finish your plate or use smaller plates. o Share your dessert. o Eat sugar free Jello for dessert or frozen grapes. o Don't eat 2-3 hours before bed. o Switch to whole wheat bread, pasta, and brown rice. o Make healthier choices when you eat out. No fries! o Pick baked chicken, NOT fried. o Don't forget to SLOW DOWN when you eat. It is not going anywhere.  o Take the stairs. o Park far away in the parking lot o Lift soup cans (or weights) for 10 minutes while watching TV. o Walk at work for 10 minutes during break. o Walk outside 1 time a week with your friend, kids, dog, or significant other. o Start a walking group at  church. o Walk the mall as much as you can tolerate.  o Keep a food diary. o Weigh yourself daily. o Walk for 15 minutes 3 days per week. o Cook at home more often and eat out less.  If life happens and you go back to old habits, it is okay.  Just start over. You can do it!   If you experience chest pain, get short of breath, or tired during the exercise, please stop immediately and inform your doctor.  .  

## 2015-07-19 NOTE — Progress Notes (Signed)
Assessment and Plan:  Hypertension: Off ACE due to cough, will add on losartan, if cough starts again will try diovan, monitor blood pressure at home. Continue DASH diet. Cholesterol: Continue diet and exercise. Check cholesterol.  Diabetes-Continue diet and exercise. Check A1C, cut glimepiride in half, add ARB, allergy to ACE Vitamin D Def- check level and continue medications.  Asthma/COPD- continue proair, will add symbicort for daily use, samples given.   Continue diet and meds as discussed. Further disposition pending results of labs. Discussed med's effects and SE's.    HPI 59 y.o. male  presents for 3 month follow up with hypertension, hyperlipidemia, diabetes and vitamin D. His blood pressure has not been controlled at home, he was taken off his ACE due to cough and it is better, not on med at this time, today their BP is BP: (!) 162/96 mmHg He does not workout. He denies chest pain, shortness of breath, dizziness.  He is on cholesterol medication and denies myalgias. His cholesterol is not at goal. The cholesterol last visit was:   Lab Results  Component Value Date   CHOL 102 04/12/2015   HDL 29* 04/12/2015   LDLCALC 61 04/12/2015   TRIG 60 04/12/2015   CHOLHDL 3.5 04/12/2015   He has been working on diet and exercise for Diabetes, he was put on invokamet but could not tolerate it due to yeast infections, off metformin due to diarrhea, he is on 1 MF a day and he takes glimepiride morning and night, and denies polydipsia, polyuria and visual disturbances. Last A1C in the office was:  Lab Results  Component Value Date   HGBA1C 6.0* 04/12/2015   Lab Results  Component Value Date   GFRNONAA 82 04/12/2015   Patient is on Vitamin D supplement, 10,000 daily. Lab Results  Component Value Date   VD25OH 52 04/12/2015     BMI is Body mass index is 35.38 kg/(m^2)., he is working on diet and exercise. Wt Readings from Last 3 Encounters:  07/19/15 243 lb (110.224 kg)  04/12/15 241  lb (109.317 kg)  01/04/15 251 lb (113.853 kg)     Current Medications:  Current Outpatient Prescriptions on File Prior to Visit  Medication Sig Dispense Refill  . buPROPion (WELLBUTRIN XL) 150 MG 24 hr tablet TAKE 1 TABLET (150 MG TOTAL) BY MOUTH EVERY MORNING. 30 tablet 2  . Cholecalciferol (VITAMIN D PO) Take 5,000 Int'l Units by mouth daily.    Marland Kitchen CIALIS 20 MG tablet TAKE 1 TABLET (20 MG TOTAL) BY MOUTH DAILY AS NEEDED. 6 tablet 3  . enalapril (VASOTEC) 10 MG tablet Take 1 tablet (10 mg total) by mouth daily. (Patient not taking: Reported on 04/12/2015) 90 tablet 1  . glimepiride (AMARYL) 4 MG tablet TAKE 1/2 TO 1 TAB 2 X DAILY WITH A MEAL FOR DIABETES. 60 tablet 2  . glucose blood test strip 1 each by Other route as needed for other. Test Blood Sugar Once daily 100 each PRN  . lovastatin (MEVACOR) 20 MG tablet Take 1 tablet (20 mg total) by mouth at bedtime. 90 tablet 1  . metFORMIN (GLUCOPHAGE XR) 500 MG 24 hr tablet Take 2 tablets (1,000 mg total) by mouth 2 (two) times daily with a meal. 120 tablet 2  . PROAIR HFA 108 (90 BASE) MCG/ACT inhaler USE 2 PUFFS BY MOUTH EVERY 4 HOURS AS NEEDED FOR WHEEZING AND SHORTNESS OF BREATH 8.5 Inhaler 5   No current facility-administered medications on file prior to visit.   Medical  History:  Past Medical History  Diagnosis Date  . Hyperlipidemia   . Hypertension   . Asthma   . Erectile dysfunction   . Other testicular hypofunction   . Type II or unspecified type diabetes mellitus without mention of complication, not stated as uncontrolled    Allergies:  Allergies  Allergen Reactions  . Flax [Bio-Flax]     Review of Systems  Constitutional: Negative.   HENT: Negative.   Eyes: Negative.   Respiratory: Negative.   Cardiovascular: Negative.   Gastrointestinal: Negative.   Genitourinary: Negative.   Musculoskeletal: Positive for myalgias and back pain. Negative for joint pain, falls and neck pain.  Skin: Negative.   Neurological:  Negative.   Endo/Heme/Allergies: Negative.   Psychiatric/Behavioral: Negative.      Family history- Review and unchanged Social history- Review and unchanged Physical Exam: BP 162/96 mmHg  Pulse 64  Temp(Src) 98.9 F (37.2 C)  Resp 16  Ht 5' 9.5" (1.765 m)  Wt 243 lb (110.224 kg)  BMI 35.38 kg/m2 Wt Readings from Last 3 Encounters:  07/19/15 243 lb (110.224 kg)  04/12/15 241 lb (109.317 kg)  01/04/15 251 lb (113.853 kg)   General Appearance: Well nourished, in no apparent distress. Eyes: PERRLA, EOMs, conjunctiva no swelling or erythema Sinuses: No Frontal/maxillary tenderness ENT/Mouth: Ext aud canals clear, TMs without erythema, bulging. No erythema, swelling, or exudate on post pharynx.  Tonsils not swollen or erythematous. Hearing normal.  Neck: Supple, thyroid normal.  Respiratory: Respiratory effort normal, BS equal bilaterally without rales, rhonchi, wheezing or stridor.  Cardio: RRR with no MRGs. Brisk peripheral pulses without edema.  Abdomen: Soft, + BS.  Non tender, no guarding, rebound, hernias, masses. Lymphatics: Non tender without lymphadenopathy.  Musculoskeletal: Full ROM, 5/5 strength, normal gait.  Skin: Warm, dry without rashes, lesions, ecchymosis.  Neuro: Cranial nerves intact. No cerebellar symptoms. Sensation intact.  Psych: Awake and oriented X 3, normal affect, Insight and Judgment appropriate.    Vicie Mutters 10:54 AM

## 2015-07-20 LAB — VITAMIN D 25 HYDROXY (VIT D DEFICIENCY, FRACTURES): VIT D 25 HYDROXY: 61 ng/mL (ref 30–100)

## 2015-07-31 ENCOUNTER — Encounter: Payer: Self-pay | Admitting: *Deleted

## 2015-09-09 ENCOUNTER — Other Ambulatory Visit: Payer: Self-pay | Admitting: Physician Assistant

## 2015-09-09 DIAGNOSIS — J4520 Mild intermittent asthma, uncomplicated: Secondary | ICD-10-CM

## 2015-11-01 ENCOUNTER — Ambulatory Visit (INDEPENDENT_AMBULATORY_CARE_PROVIDER_SITE_OTHER): Payer: 59 | Admitting: Internal Medicine

## 2015-11-01 ENCOUNTER — Ambulatory Visit: Payer: Self-pay | Admitting: Physician Assistant

## 2015-11-01 ENCOUNTER — Other Ambulatory Visit: Payer: Self-pay

## 2015-11-01 ENCOUNTER — Encounter: Payer: Self-pay | Admitting: Internal Medicine

## 2015-11-01 VITALS — BP 128/76 | HR 70 | Temp 98.2°F | Resp 18 | Ht 69.5 in | Wt 259.0 lb

## 2015-11-01 DIAGNOSIS — Z79899 Other long term (current) drug therapy: Secondary | ICD-10-CM

## 2015-11-01 DIAGNOSIS — E559 Vitamin D deficiency, unspecified: Secondary | ICD-10-CM

## 2015-11-01 DIAGNOSIS — I10 Essential (primary) hypertension: Secondary | ICD-10-CM

## 2015-11-01 DIAGNOSIS — J4520 Mild intermittent asthma, uncomplicated: Secondary | ICD-10-CM

## 2015-11-01 DIAGNOSIS — E785 Hyperlipidemia, unspecified: Secondary | ICD-10-CM | POA: Diagnosis not present

## 2015-11-01 DIAGNOSIS — E119 Type 2 diabetes mellitus without complications: Secondary | ICD-10-CM | POA: Diagnosis not present

## 2015-11-01 LAB — CBC WITH DIFFERENTIAL/PLATELET
BASOS ABS: 0 10*3/uL (ref 0.0–0.1)
Basophils Relative: 0 % (ref 0–1)
Eosinophils Absolute: 0.2 10*3/uL (ref 0.0–0.7)
Eosinophils Relative: 2 % (ref 0–5)
HCT: 44.4 % (ref 39.0–52.0)
HEMOGLOBIN: 15 g/dL (ref 13.0–17.0)
LYMPHS ABS: 3.4 10*3/uL (ref 0.7–4.0)
Lymphocytes Relative: 29 % (ref 12–46)
MCH: 29.4 pg (ref 26.0–34.0)
MCHC: 33.8 g/dL (ref 30.0–36.0)
MCV: 86.9 fL (ref 78.0–100.0)
MONOS PCT: 7 % (ref 3–12)
MPV: 9.4 fL (ref 8.6–12.4)
Monocytes Absolute: 0.8 10*3/uL (ref 0.1–1.0)
NEUTROS ABS: 7.2 10*3/uL (ref 1.7–7.7)
NEUTROS PCT: 62 % (ref 43–77)
Platelets: 234 10*3/uL (ref 150–400)
RBC: 5.11 MIL/uL (ref 4.22–5.81)
RDW: 13.8 % (ref 11.5–15.5)
WBC: 11.6 10*3/uL — ABNORMAL HIGH (ref 4.0–10.5)

## 2015-11-01 LAB — BASIC METABOLIC PANEL WITH GFR
BUN: 18 mg/dL (ref 7–25)
CHLORIDE: 101 mmol/L (ref 98–110)
CO2: 27 mmol/L (ref 20–31)
CREATININE: 1.17 mg/dL (ref 0.70–1.33)
Calcium: 9.9 mg/dL (ref 8.6–10.3)
GFR, Est African American: 80 mL/min (ref >=60)
GFR, Est Non African American: 69 mL/min (ref >=60)
Glucose, Bld: 136 mg/dL — ABNORMAL HIGH (ref 65–99)
POTASSIUM: 4.6 mmol/L (ref 3.5–5.3)
SODIUM: 136 mmol/L (ref 135–146)

## 2015-11-01 LAB — HEPATIC FUNCTION PANEL
ALK PHOS: 56 U/L (ref 40–115)
ALT: 18 U/L (ref 9–46)
AST: 18 U/L (ref 10–35)
Albumin: 4.5 g/dL (ref 3.6–5.1)
BILIRUBIN DIRECT: 0.1 mg/dL (ref <=0.2)
Indirect Bilirubin: 0.5 mg/dL (ref 0.2–1.2)
Total Bilirubin: 0.6 mg/dL (ref 0.2–1.2)
Total Protein: 7.4 g/dL (ref 6.1–8.1)

## 2015-11-01 LAB — LIPID PANEL
CHOL/HDL RATIO: 3.5 ratio (ref <=5.0)
Cholesterol: 141 mg/dL (ref 125–200)
HDL: 40 mg/dL (ref >=40)
LDL CALC: 80 mg/dL (ref <130)
TRIGLYCERIDES: 106 mg/dL (ref <150)
VLDL: 21 mg/dL (ref <30)

## 2015-11-01 LAB — HEMOGLOBIN A1C
HEMOGLOBIN A1C: 6.5 % — AB (ref <5.7)
Mean Plasma Glucose: 140 mg/dL — ABNORMAL HIGH (ref <117)

## 2015-11-01 LAB — TSH: TSH: 2.579 u[IU]/mL (ref 0.350–4.500)

## 2015-11-01 MED ORDER — PROAIR HFA 108 (90 BASE) MCG/ACT IN AERS: 1.0000 | INHALATION_SPRAY | Freq: Four times a day (QID) | RESPIRATORY_TRACT | Status: DC | PRN

## 2015-11-01 NOTE — Progress Notes (Signed)
Patient ID: COURTLIN COCKER, male   DOB: 08/21/1956, 60 y.o.   MRN: [id number]  Assessment and Plan:  Hypertension:  -Continue medication -monitor blood pressure at home. -Continue DASH diet -Reminder to go to the ER if any CP, SOB, nausea, dizziness, severe HA, changes vision/speech, left arm numbness and tingling and jaw pain.  Cholesterol - Continue diet and exercise -Check cholesterol.   Diabetes with diabetic chronic kidney disease  -large weight gain from last visit -discussed the importance of diet and exercise -no change in medication today, but if still up in 3 months recommend another medication on board -Continue diet and exercise.  -Check A1C  Vitamin D Def -check level -continue medications.   Continue diet and meds as discussed. Further disposition pending results of labs. Discussed med's effects and SE's.    HPI 60 y.o. male  presents for 3 month follow up with hypertension, hyperlipidemia, diabetes and vitamin D deficiency.   His blood pressure has been controlled at home, today their BP is BP: 128/76 mmHg.He does workout. He denies chest pain, shortness of breath, dizziness.   He is on cholesterol medication and denies myalgias. His cholesterol is at goal. The cholesterol was:  07/19/2015: Cholesterol 159; HDL 34*; LDL Cholesterol 109; Triglycerides 81   He has been working on diet and exercise for diabetes with diabetic chronic kidney disease, he is on bASA, he is on ACE/ARB, and denies  foot ulcerations, hyperglycemia, hypoglycemia , increased appetite, nausea, paresthesia of the feet, polydipsia, polyuria, visual disturbances, vomiting and weight loss. Last A1C was: 07/19/2015: Hgb A1c MFr Bld 6.1*.  He reports that he doesn't check his blood sugar on a regular basis.  He reports that he hasn't been exercising either.     Patient is on Vitamin D supplement. 07/19/2015: Vit D, 25-Hydroxy 61    Current Medications:  Current Outpatient Prescriptions on File Prior to  Visit  Medication Sig Dispense Refill  . budesonide-formoterol (SYMBICORT) 160-4.5 MCG/ACT inhaler Inhale 2 puffs into the lungs 2 (two) times daily. 1 Inhaler 12  . Cholecalciferol (VITAMIN D PO) Take 5,000 Int'l Units by mouth daily.    . fluticasone (FLONASE) 50 MCG/ACT nasal spray USE 1 SPRAY(S) INTRANASALLY ONCE A DAY  0  . glimepiride (AMARYL) 4 MG tablet TAKE 1/2 TO 1 TAB 2 X DAILY WITH A MEAL FOR DIABETES. 180 tablet 1  . glucose blood test strip 1 each by Other route as needed for other. Test Blood Sugar Once daily 100 each PRN  . losartan (COZAAR) 50 MG tablet Take 1 tablet (50 mg total) by mouth daily. 30 tablet 11  . metFORMIN (GLUCOPHAGE XR) 500 MG 24 hr tablet Take 2 tablets (1,000 mg total) by mouth 2 (two) times daily with a meal. 120 tablet 2  . PROAIR HFA 108 (90 BASE) MCG/ACT inhaler USE 2 PUFFS BY MOUTH EVERY 4 HOURS AS NEEDED FOR WHEEZING AND SHORTNESS OF BREATH 18 g 5  . tadalafil (CIALIS) 20 MG tablet TAKE 1 TABLET (20 MG TOTAL) BY MOUTH DAILY AS NEEDED. 6 tablet 3  . lovastatin (MEVACOR) 20 MG tablet Take 1 tablet (20 mg total) by mouth at bedtime. 90 tablet 1   No current facility-administered medications on file prior to visit.   Medical History:  Past Medical History  Diagnosis Date  . Hyperlipidemia   . Hypertension   . Asthma   . Erectile dysfunction   . Other testicular hypofunction   . Type II or unspecified type diabetes mellitus  without mention of complication, not stated as uncontrolled    Allergies:  Allergies  Allergen Reactions  . Flax [Bio-Flax]      Review of Systems:  Review of Systems  Constitutional: Negative for fever, chills and malaise/fatigue.  HENT: Negative for congestion, ear pain and sore throat.   Skin: Negative.   Neurological: Negative for headaches.    Family history- Review and unchanged  Social history- Review and unchanged  Physical Exam: BP 128/76 mmHg  Pulse 70  Temp(Src) 98.2 F (36.8 C) (Temporal)  Resp 18   Ht 5' 9.5" (1.765 m)  Wt 259 lb (117.482 kg)  BMI 37.71 kg/m2 Wt Readings from Last 3 Encounters:  11/01/15 259 lb (117.482 kg)  07/19/15 243 lb (110.224 kg)  04/12/15 241 lb (109.317 kg)   General Appearance: Well nourished well developed, non-toxic appearing, in no apparent distress. Eyes: PERRLA, EOMs, conjunctiva no swelling or erythema ENT/Mouth: Ear canals clear with no erythema, swelling, or discharge.  TMs normal bilaterally, oropharynx clear, moist, with no exudate.   Neck: Supple, thyroid normal, no JVD, no cervical adenopathy.  Respiratory: Respiratory effort normal, breath sounds clear A&P, no wheeze, rhonchi or rales noted.  No retractions, no accessory muscle usage Cardio: RRR with no MRGs. No noted edema.  Abdomen: Soft, + BS.  Non tender, no guarding, rebound, hernias, masses. Musculoskeletal: Full ROM, 5/5 strength, Normal gait Skin: Warm, dry without rashes, lesions, ecchymosis.  Neuro: Awake and oriented X 3, Cranial nerves intact. No cerebellar symptoms.  Psych: normal affect, Insight and Judgment appropriate.    Starlyn Skeans, PA-C 10:54 AM [facility]

## 2016-01-06 ENCOUNTER — Encounter: Payer: Self-pay | Admitting: Physician Assistant

## 2016-03-13 ENCOUNTER — Encounter: Payer: Self-pay | Admitting: Physician Assistant

## 2016-03-13 ENCOUNTER — Ambulatory Visit (INDEPENDENT_AMBULATORY_CARE_PROVIDER_SITE_OTHER): Payer: 59 | Admitting: Physician Assistant

## 2016-03-13 VITALS — BP 120/90 | HR 76 | Temp 97.7°F | Resp 16 | Ht 69.5 in | Wt 264.3 lb

## 2016-03-13 DIAGNOSIS — F17200 Nicotine dependence, unspecified, uncomplicated: Secondary | ICD-10-CM

## 2016-03-13 DIAGNOSIS — E1169 Type 2 diabetes mellitus with other specified complication: Secondary | ICD-10-CM

## 2016-03-13 DIAGNOSIS — J45909 Unspecified asthma, uncomplicated: Secondary | ICD-10-CM | POA: Diagnosis not present

## 2016-03-13 DIAGNOSIS — E291 Testicular hypofunction: Secondary | ICD-10-CM | POA: Diagnosis not present

## 2016-03-13 DIAGNOSIS — I10 Essential (primary) hypertension: Secondary | ICD-10-CM | POA: Diagnosis not present

## 2016-03-13 DIAGNOSIS — E559 Vitamin D deficiency, unspecified: Secondary | ICD-10-CM

## 2016-03-13 DIAGNOSIS — R6889 Other general symptoms and signs: Secondary | ICD-10-CM | POA: Diagnosis not present

## 2016-03-13 DIAGNOSIS — Z79899 Other long term (current) drug therapy: Secondary | ICD-10-CM | POA: Diagnosis not present

## 2016-03-13 DIAGNOSIS — E669 Obesity, unspecified: Secondary | ICD-10-CM | POA: Diagnosis not present

## 2016-03-13 DIAGNOSIS — J4520 Mild intermittent asthma, uncomplicated: Secondary | ICD-10-CM

## 2016-03-13 DIAGNOSIS — D649 Anemia, unspecified: Secondary | ICD-10-CM

## 2016-03-13 DIAGNOSIS — E119 Type 2 diabetes mellitus without complications: Secondary | ICD-10-CM | POA: Diagnosis not present

## 2016-03-13 DIAGNOSIS — E785 Hyperlipidemia, unspecified: Secondary | ICD-10-CM | POA: Diagnosis not present

## 2016-03-13 DIAGNOSIS — N521 Erectile dysfunction due to diseases classified elsewhere: Secondary | ICD-10-CM

## 2016-03-13 DIAGNOSIS — Z125 Encounter for screening for malignant neoplasm of prostate: Secondary | ICD-10-CM

## 2016-03-13 DIAGNOSIS — Z0001 Encounter for general adult medical examination with abnormal findings: Secondary | ICD-10-CM

## 2016-03-13 LAB — LIPID PANEL
CHOL/HDL RATIO: 3.8 ratio (ref <=5.0)
CHOLESTEROL: 124 mg/dL — AB (ref 125–200)
HDL: 33 mg/dL — ABNORMAL LOW (ref >=40)
LDL CALC: 73 mg/dL (ref <130)
Triglycerides: 88 mg/dL (ref <150)
VLDL: 18 mg/dL (ref <30)

## 2016-03-13 LAB — HEPATIC FUNCTION PANEL
ALK PHOS: 68 U/L (ref 40–115)
ALT: 14 U/L (ref 9–46)
AST: 17 U/L (ref 10–35)
Albumin: 4.1 g/dL (ref 3.6–5.1)
BILIRUBIN DIRECT: 0.1 mg/dL (ref <=0.2)
BILIRUBIN INDIRECT: 0.4 mg/dL (ref 0.2–1.2)
TOTAL PROTEIN: 7.1 g/dL (ref 6.1–8.1)
Total Bilirubin: 0.5 mg/dL (ref 0.2–1.2)

## 2016-03-13 LAB — FERRITIN: FERRITIN: 70 ng/mL (ref 20–380)

## 2016-03-13 LAB — IRON AND TIBC
%SAT: 22 % (ref 15–60)
Iron: 79 ug/dL (ref 50–180)
TIBC: 360 ug/dL (ref 250–425)
UIBC: 281 ug/dL (ref 125–400)

## 2016-03-13 LAB — URINALYSIS, ROUTINE W REFLEX MICROSCOPIC
Bilirubin Urine: NEGATIVE
GLUCOSE, UA: NEGATIVE
Hgb urine dipstick: NEGATIVE
Ketones, ur: NEGATIVE
LEUKOCYTES UA: NEGATIVE
NITRITE: NEGATIVE
PH: 6 (ref 5.0–8.0)
Protein, ur: NEGATIVE
SPECIFIC GRAVITY, URINE: 1.011 (ref 1.001–1.035)

## 2016-03-13 LAB — BASIC METABOLIC PANEL WITH GFR
BUN: 14 mg/dL (ref 7–25)
CALCIUM: 8.9 mg/dL (ref 8.6–10.3)
CHLORIDE: 102 mmol/L (ref 98–110)
CO2: 27 mmol/L (ref 20–31)
Creat: 1.08 mg/dL (ref 0.70–1.33)
GFR, EST NON AFRICAN AMERICAN: 76 mL/min (ref >=60)
GFR, Est African American: 88 mL/min (ref >=60)
GLUCOSE: 131 mg/dL — AB (ref 65–99)
POTASSIUM: 4.4 mmol/L (ref 3.5–5.3)
SODIUM: 137 mmol/L (ref 135–146)

## 2016-03-13 LAB — CBC WITH DIFFERENTIAL/PLATELET
BASOS PCT: 0 %
Basophils Absolute: 0 cells/uL (ref 0–200)
EOS PCT: 1 %
Eosinophils Absolute: 96 cells/uL (ref 15–500)
HEMATOCRIT: 43.4 % (ref 38.5–50.0)
HEMOGLOBIN: 14.6 g/dL (ref 13.2–17.1)
LYMPHS ABS: 2208 {cells}/uL (ref 850–3900)
Lymphocytes Relative: 23 %
MCH: 30.2 pg (ref 27.0–33.0)
MCHC: 33.6 g/dL (ref 32.0–36.0)
MCV: 89.7 fL (ref 80.0–100.0)
MONO ABS: 576 {cells}/uL (ref 200–950)
MPV: 9.6 fL (ref 7.5–12.5)
Monocytes Relative: 6 %
NEUTROS ABS: 6720 {cells}/uL (ref 1500–7800)
NEUTROS PCT: 70 %
Platelets: 225 10*3/uL (ref 140–400)
RBC: 4.84 MIL/uL (ref 4.20–5.80)
RDW: 13.5 % (ref 11.0–15.0)
WBC: 9.6 10*3/uL (ref 3.8–10.8)

## 2016-03-13 LAB — TSH: TSH: 1.84 mIU/L (ref 0.40–4.50)

## 2016-03-13 LAB — MAGNESIUM: MAGNESIUM: 1.7 mg/dL (ref 1.5–2.5)

## 2016-03-13 LAB — VITAMIN B12: VITAMIN B 12: 355 pg/mL (ref 200–1100)

## 2016-03-13 MED ORDER — BUPROPION HCL ER (XL) 150 MG PO TB24: 150.0000 mg | ORAL_TABLET | ORAL | Status: DC

## 2016-03-13 MED ORDER — TADALAFIL 20 MG PO TABS: ORAL_TABLET | ORAL | Status: DC

## 2016-03-13 MED ORDER — PROAIR HFA 108 (90 BASE) MCG/ACT IN AERS: 1.0000 | INHALATION_SPRAY | Freq: Four times a day (QID) | RESPIRATORY_TRACT | Status: DC | PRN

## 2016-03-13 MED ORDER — GLIMEPIRIDE 4 MG PO TABS: ORAL_TABLET | ORAL | Status: DC

## 2016-03-13 MED ORDER — LOVASTATIN 20 MG PO TABS: 20.0000 mg | ORAL_TABLET | Freq: Every day | ORAL | Status: DC

## 2016-03-13 NOTE — Progress Notes (Signed)
Complete Physical  Assessment and Plan: 1. Essential hypertension - continue medications, DASH diet, exercise and monitor at home. Call if greater than 130/80.  - CBC with Differential/Platelet - BASIC METABOLIC PANEL WITH GFR - Hepatic function panel - TSH - Urinalysis, Routine w reflex microscopic - Microalbumin / creatinine urine ratio - EKG 12-Lead  2. Hyperlipidemia -continue medications, check lipids, decrease fatty foods, increase activity.  - Lipid panel  3. Diabetes mellitus without complication Discussed general issues about diabetes pathophysiology and management., Educational material distributed., Suggested low cholesterol diet., Encouraged aerobic exercise., Discussed foot care., Reminded to get yearly retinal exam. - Hemoglobin A1c - LOW EXTREMITY NEUR EXAM DOCUM- NEGATIVE  4. Obesity (BMI 30-39.9) Obesity with co morbidities- long discussion about weight loss, diet, and exercis - Uric acid  5. Hypogonadism in male - Testosterone Weight loss advised  6. Asthma, unspecified asthma severity, uncomplicated - albuterol (PROAIR HFA) 108 (90 BASE) MCG/ACT inhaler; USE 2 PUFFS BY MOUTH EVERY 4 HOURS AS NEEDED FOR WHEEZING AND SHORTNESS OF BREATH  Dispense: 18 g; Refill: 2 -stop smoking - VENTOLIN DOES NOT HELP PATIENT, CAN ONLY USE PROAIR  7. Tobacco use disorder Smoking cessation-  instruction/counseling given, counseled patient on the dangers of tobacco use, advised patient to stop smoking, and reviewed strategies to maximize success, patient not ready to quit at this time.  - DG Chest 2 View; Future - add wellbutrin for anxiety/weight loss/smoking cessation  8. Erectile dysfunction associated with type 2 diabetes mellitus Weight loss advised - tadalafil (CIALIS) 20 MG tablet; Take 1 tablet (20 mg total) by mouth daily as needed.  Dispense: 6 tablet; Refill: 3  9.   Vitamin D deficiency - Vit D  25 hydroxy (rtn osteoporosis monitoring)  10. Medication  management - Magnesium  11. Prostate cancer screening - PSA   Discussed med's effects and SE's. Screening labs and tests as requested with regular follow-up as recommended.  HPI 60 y.o. male  presents for a complete physical. His blood pressure has been controlled at home, today their BP is BP: 120/90 mmHg He does not workout. He denies chest pain, shortness of breath, dizziness.  He has asthma/COPD, is on symbicort and will occ use albuterol, has been more recently.  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 . He has asthma and has been increasing his inhaler use.  He is on cholesterol medication, lovastatin 20mg  but admits to not taking it regularly and denies myalgias. His cholesterol is not at goal, less than 70. The cholesterol last visit was:   Lab Results  Component Value Date   CHOL 141 11/01/2015   HDL 40 11/01/2015   LDLCALC 80 11/01/2015   TRIG 106 11/01/2015   CHOLHDL 3.5 11/01/2015   He has not been working on diet and exercise for diabetes, and denies foot ulcerations, hypoglycemia , paresthesia of the feet, polydipsia, polyuria and visual disturbances. He is on ACE and on bASA and metformin and glimepiride, he is on 4mg  BID, has not been checking sugars. Tried invokana but could not tolerate it.  Last A1C in the office was:  Lab Results  Component Value Date   HGBA1C 6.5* 11/01/2015   Patient is on Vitamin D supplement.   Lab Results  Component Value Date   VD25OH 61 07/19/2015   BMI is Body mass index is 38.48 kg/(m^2)., he is working on diet and exercise. Due to  his obesity/low testosterone he has ED and uses cialis PRN.  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  Wt Readings from Last 3 Encounters:  03/13/16 264 lb 4.8 oz (119.886 kg)  11/01/15 259 lb (117.482 kg)  07/19/15 243 lb (110.224 kg)   Lab Results  Component Value  Date   PSA 0.22 01/04/2015   PSA 0.29 12/29/2013    Current Medications:  Current Outpatient Prescriptions on File Prior to Visit  Medication Sig Dispense Refill  . budesonide-formoterol (SYMBICORT) 160-4.5 MCG/ACT inhaler Inhale 2 puffs into the lungs 2 (two) times daily. 1 Inhaler 12  . Cholecalciferol (VITAMIN D PO) Take 5,000 Int'l Units by mouth daily.    . fluticasone (FLONASE) 50 MCG/ACT nasal spray USE 1 SPRAY(S) INTRANASALLY ONCE A DAY  0  . glimepiride (AMARYL) 4 MG tablet TAKE 1/2 TO 1 TAB 2 X DAILY WITH A MEAL FOR DIABETES. 180 tablet 1  . glucose blood test strip 1 each by Other route as needed for other. Test Blood Sugar Once daily 100 each PRN  . losartan (COZAAR) 50 MG tablet Take 1 tablet (50 mg total) by mouth daily. 30 tablet 11  . lovastatin (MEVACOR) 20 MG tablet Take 1 tablet (20 mg total) by mouth at bedtime. 90 tablet 1  . metFORMIN (GLUCOPHAGE XR) 500 MG 24 hr tablet Take 2 tablets (1,000 mg total) by mouth 2 (two) times daily with a meal. 120 tablet 2  . PROAIR HFA 108 (90 Base) MCG/ACT inhaler Inhale 1-2 puffs into the lungs every 6 (six) hours as needed for wheezing or shortness of breath. 18 g 5  . tadalafil (CIALIS) 20 MG tablet TAKE 1 TABLET (20 MG TOTAL) BY MOUTH DAILY AS NEEDED. 6 tablet 3   No current facility-administered medications on file prior to visit.   Immunization History  Administered Date(s) Administered  . Tdap 01/04/2015    Health Maintenance:  Tetanus: 2016 Pneumovax: declines Prevnar 13: when 67 Flu vaccine: 07/2013 Zostavax: declines DEXA:N/A Colonoscopy: declines- long discussion, understands risk EGD: N/A  Eye Doctor: [facility]- looking for doctor, goes a year Dentist:None  Allergies:  Allergies  Allergen Reactions  . Flax [Bio-Flax]    Medical History:  Past Medical History  Diagnosis Date  . Hyperlipidemia   . Hypertension   . Asthma   . Erectile dysfunction   . Other testicular hypofunction   . Type II or  unspecified type diabetes mellitus without mention of complication, not stated as uncontrolled    Surgical History: No past surgical history on file. Family History:  Family History  Problem Relation Age of Onset  . Stroke Mother   . Hypertension Mother   . Diabetes Mother   . Cancer Father     lung   Social History:   Social History  Substance Use Topics  . Smoking status: Current Every Day Smoker  . Smokeless tobacco: None  . 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   Review of Systems  Constitutional: Negative.   HENT: Negative.   Eyes: Negative.   Respiratory: Negative.   Cardiovascular: Negative.   Gastrointestinal: Negative.   Genitourinary: Negative.   Musculoskeletal: Positive for myalgias and back pain. Negative for joint pain, falls and neck pain.  Skin: Negative.   Neurological: Negative.   Endo/Heme/Allergies: Negative.   Psychiatric/Behavioral: Negative for depression, suicidal ideas, hallucinations, memory loss and substance abuse.  The patient has insomnia. The patient is not nervous/anxious.      Physical Exam: Estimated body mass index is 38.48 kg/(m^2) as calculated from the following:   Height as of this encounter: 5' 9.5" (1.765 m).   Weight as of this encounter: 264 lb 4.8 oz (119.886 kg). Filed Vitals:   03/13/16 1025  BP: 120/90  Pulse: 76  Temp: 97.7 F (36.5 C)  Resp: 16   General Appearance: Well nourished, in no apparent distress. Eyes: PERRLA, EOMs, conjunctiva no swelling or erythema, normal fundi and vessels. Sinuses: No Frontal/maxillary tenderness ENT/Mouth: Ext aud canals clear, normal light reflex with TMs without erythema, bulging. Good dentition. No erythema, swelling, or exudate on post pharynx. Tonsils not swollen or erythematous. Hearing normal.  Neck: Supple, thyroid normal. No bruits Respiratory: Respiratory effort normal, BS equal bilaterally with mild expiratory wheeze without rales, rhonchi, or  stridor. Cardio: RRR without murmurs, rubs or gallops. Brisk peripheral pulses without edema.  Chest: symmetric, with normal excursions and percussion. Abdomen: Soft, obese +BS. Non tender, no guarding, rebound, hernias, masses, or organomegaly. .  Lymphatics: Non tender without lymphadenopathy.  Genitourinary: defer Musculoskeletal: Full ROM all peripheral extremities,5/5 strength, and normal gait. Skin: Warm, dry without rashes, lesions, ecchymosis.  Neuro: Cranial nerves intact, reflexes equal bilaterally. Normal muscle tone, no cerebellar symptoms. Sensation intact.  Psych: Awake and oriented X 3, normal affect, Insight and Judgment appropriate.   EKG: WNL no changes.   Vicie Mutters 10:27 AM

## 2016-03-13 NOTE — Patient Instructions (Addendum)
Ask pharmacy for prior authorization for proair for step therapy  Do capful of peroxide and capful of vinegar in warm water, swish and spit.   Take wellbutrin when you first wake  Diabetes is a very complicated disease...lets simplify it.  An easy way to look at it to understand the complications is if you think of the extra sugar floating in your blood stream as glass shards floating through your blood stream.    Diabetes affects your small vessels first: 1) The glass shards (sugar) scraps down the tiny blood vessels in your eyes and lead to diabetic retinopathy, the leading cause of blindness in the [place]. Diabetes is the leading cause of newly diagnosed adult (7 to 58 years of age) blindness in the [place].  2) The glass shards scratches down the tiny vessels of your legs leading to nerve damage called neuropathy and can lead to amputations of your feet. More than 60% of all non-traumatic amputations of lower limbs occur in people with diabetes.  3) Over time the small vessels in your brain are shredded and closed off, individually this does not cause any problems but over a long period of time many of the small vessels being blocked can lead to Vascular Dementia.   4) Your kidney's are a filter system and have a "net" that keeps certain things in the body and lets bad things out. Sugar shreds this net and leads to kidney damage and eventually failure. Decreasing the sugar that is destroying the net and certain blood pressure medications can help stop or decrease progression of kidney disease. Diabetes was the primary cause of kidney failure in 44 percent of all new cases in 2011.  5) Diabetes also destroys the small vessels in your penis that lead to erectile dysfunction. Eventually the vessels are so damaged that you may not be responsive to cialis or viagra.   Diabetes and your large vessels: Your larger vessels consist of your coronary arteries in your heart and the carotid vessels  to your brain. Diabetes or even increased sugars put you at 300% increased risk of heart attack and stroke and this is why.. The sugar scrapes down your large blood vessels and your body sees this as an internal injury and tries to repair itself. Just like you get a scab on your skin, your platelets will stick to the blood vessel wall trying to heal it. This is why we have diabetics on low dose aspirin daily, this prevents the platelets from sticking and can prevent plaque formation. In addition, your body takes cholesterol and tries to shove it into the open wound. This is why we want your LDL, or bad cholesterol, below 70.   The combination of platelets and cholesterol over 5-10 years forms plaque that can break off and cause a heart attack or stroke.   PLEASE REMEMBER:  Diabetes is preventable! Up to 67 percent of complications and morbidities among individuals with type 2 diabetes can be prevented, delayed, or effectively treated and minimized with regular visits to a health professional, appropriate monitoring and medication, and a healthy diet and lifestyle.  Here is some information to help you keep your heart healthy: Move it! - Aim for 30 mins of activity every day. Take it slowly at first. Talk to [place] before starting any new exercise program.   Lose it.  -Body Mass Index (BMI) can indicate if you need to lose weight. A healthy range is 18.5-24.9. For a BMI calculator, go to [employer]  Waist Management -Excess abdominal fat is a risk factor for heart disease, diabetes, asthma, stroke and more. Ideal waist circumference is less than 35" for women and less than 40" for men.   Eat Right -focus on fruits, vegetables, whole grains, and meals you make yourself. Avoid foods with trans fat and high sugar/sodium content.   Snooze or Snore? - Loud snoring can be a sign of sleep apnea, a significant risk factor for high blood pressure, heart attach, stroke, and heart arrhythmias.  Kick the  habit -Quit Smoking! Avoid second hand smoke. A single cigarette raises your blood pressure for 20 mins and increases the risk of heart attack and stroke for the next 24 hours.   Are Aspirin and Supplements right for you? -Add ENTERIC COATED low dose 81 mg Aspirin daily OR can do every other day if you have easy bruising to protect your heart and head. As well as to reduce risk of Colon Cancer by 20 %, Skin Cancer by 26 % , Melanoma by 46% and Pancreatic cancer by 60%  Say "No to Stress -There may be little you can do about problems that cause stress. However, techniques such as long walks, meditation, and exercise can help you manage it.   Start Now! - Make changes one at a time and set reasonable goals to increase your likelihood of success.

## 2016-03-14 LAB — HEMOGLOBIN A1C
Hgb A1c MFr Bld: 6.8 % — ABNORMAL HIGH (ref <5.7)
MEAN PLASMA GLUCOSE: 148 mg/dL

## 2016-03-14 LAB — MICROALBUMIN / CREATININE URINE RATIO
Creatinine, Urine: 53 mg/dL (ref 20–370)
Microalb, Ur: <0.2 mg/dL

## 2016-03-14 LAB — PSA: PSA: 0.23 ng/mL (ref <=4.00)

## 2016-03-14 LAB — VITAMIN D 25 HYDROXY (VIT D DEFICIENCY, FRACTURES): Vit D, 25-Hydroxy: 38 ng/mL (ref 30–100)

## 2016-05-06 ENCOUNTER — Other Ambulatory Visit: Payer: Self-pay | Admitting: Internal Medicine

## 2016-06-19 ENCOUNTER — Other Ambulatory Visit: Payer: Self-pay | Admitting: Physician Assistant

## 2016-07-03 ENCOUNTER — Encounter: Payer: Self-pay | Admitting: Physician Assistant

## 2016-07-03 ENCOUNTER — Ambulatory Visit (INDEPENDENT_AMBULATORY_CARE_PROVIDER_SITE_OTHER): Payer: 59 | Admitting: Physician Assistant

## 2016-07-03 VITALS — BP 130/60 | HR 62 | Temp 97.6°F | Resp 16 | Ht 69.5 in | Wt 263.2 lb

## 2016-07-03 DIAGNOSIS — I10 Essential (primary) hypertension: Secondary | ICD-10-CM

## 2016-07-03 DIAGNOSIS — Z79899 Other long term (current) drug therapy: Secondary | ICD-10-CM

## 2016-07-03 DIAGNOSIS — E785 Hyperlipidemia, unspecified: Secondary | ICD-10-CM | POA: Diagnosis not present

## 2016-07-03 DIAGNOSIS — E119 Type 2 diabetes mellitus without complications: Secondary | ICD-10-CM

## 2016-07-03 DIAGNOSIS — E559 Vitamin D deficiency, unspecified: Secondary | ICD-10-CM

## 2016-07-03 DIAGNOSIS — J449 Chronic obstructive pulmonary disease, unspecified: Secondary | ICD-10-CM | POA: Insufficient documentation

## 2016-07-03 LAB — HEPATIC FUNCTION PANEL
ALBUMIN: 4.1 g/dL (ref 3.6–5.1)
ALT: 14 U/L (ref 9–46)
AST: 14 U/L (ref 10–35)
Alkaline Phosphatase: 63 U/L (ref 40–115)
BILIRUBIN INDIRECT: 0.5 mg/dL (ref 0.2–1.2)
Bilirubin, Direct: 0.1 mg/dL (ref <=0.2)
TOTAL PROTEIN: 7 g/dL (ref 6.1–8.1)
Total Bilirubin: 0.6 mg/dL (ref 0.2–1.2)

## 2016-07-03 LAB — BASIC METABOLIC PANEL WITH GFR
BUN: 15 mg/dL (ref 7–25)
CALCIUM: 9.4 mg/dL (ref 8.6–10.3)
CO2: 28 mmol/L (ref 20–31)
CREATININE: 1.27 mg/dL (ref 0.70–1.33)
Chloride: 101 mmol/L (ref 98–110)
GFR, EST AFRICAN AMERICAN: 72 mL/min (ref >=60)
GFR, EST NON AFRICAN AMERICAN: 62 mL/min (ref >=60)
GLUCOSE: 160 mg/dL — AB (ref 65–99)
Potassium: 4.9 mmol/L (ref 3.5–5.3)
Sodium: 135 mmol/L (ref 135–146)

## 2016-07-03 LAB — CBC WITH DIFFERENTIAL/PLATELET
BASOS ABS: 0 {cells}/uL (ref 0–200)
Basophils Relative: 0 %
Eosinophils Absolute: 88 cells/uL (ref 15–500)
Eosinophils Relative: 1 %
HEMATOCRIT: 44.4 % (ref 38.5–50.0)
Hemoglobin: 14.8 g/dL (ref 13.2–17.1)
LYMPHS PCT: 31 %
Lymphs Abs: 2728 cells/uL (ref 850–3900)
MCH: 28.8 pg (ref 27.0–33.0)
MCHC: 33.3 g/dL (ref 32.0–36.0)
MCV: 86.5 fL (ref 80.0–100.0)
MONO ABS: 704 {cells}/uL (ref 200–950)
MPV: 9.6 fL (ref 7.5–12.5)
Monocytes Relative: 8 %
NEUTROS PCT: 60 %
Neutro Abs: 5280 cells/uL (ref 1500–7800)
Platelets: 212 10*3/uL (ref 140–400)
RBC: 5.13 MIL/uL (ref 4.20–5.80)
RDW: 13.5 % (ref 11.0–15.0)
WBC: 8.8 10*3/uL (ref 3.8–10.8)

## 2016-07-03 LAB — LIPID PANEL
CHOLESTEROL: 164 mg/dL (ref 125–200)
HDL: 34 mg/dL — ABNORMAL LOW (ref >=40)
LDL Cholesterol: 108 mg/dL (ref <130)
TRIGLYCERIDES: 110 mg/dL (ref <150)
Total CHOL/HDL Ratio: 4.8 Ratio (ref <=5.0)
VLDL: 22 mg/dL (ref <30)

## 2016-07-03 LAB — HEMOGLOBIN A1C
Hgb A1c MFr Bld: 7 % — ABNORMAL HIGH (ref <5.7)
MEAN PLASMA GLUCOSE: 154 mg/dL

## 2016-07-03 LAB — MAGNESIUM: MAGNESIUM: 1.7 mg/dL (ref 1.5–2.5)

## 2016-07-03 NOTE — Progress Notes (Signed)
Assessment and Plan:  1. Essential hypertension - continue medications, DASH diet, exercise and monitor at home. Call if greater than 130/80.  - CBC with Differential/Platelet - BASIC METABOLIC PANEL WITH GFR - Hepatic function panel - TSH  2. Diabetes mellitus without complication [facility]) Discussed general issues about diabetes pathophysiology and management., Educational material distributed., Suggested low cholesterol diet., Encouraged aerobic exercise., Discussed foot care., Reminded to get yearly retinal exam. - Hemoglobin A1c  3. Hyperlipidemia -continue medications, check lipids, decrease fatty foods, increase activity.  - Lipid panel  4. Morbid obesity, unspecified obesity type ([place]) Obesity with co morbidities- long discussion about weight loss, diet, and exercise - Lipid panel - Hemoglobin A1c  5. Vitamin D deficiency - VITAMIN D 25 Hydroxy (Vit-D Deficiency, Fractures)  6. Medication management - Magnesium  7. Chronic obstructive pulmonary disease, unspecified COPD type ([place]) Will try to get PA for Proair, has quit smoking, discussed strategies to continue smoking cessation, continue Wellbutrin, declines going up to 300XL at this time.    Continue diet and meds as discussed. Further disposition pending results of labs. Discussed med's effects and SE's.    HPI 60 y.o. male  presents for 3 month follow up with hypertension, hyperlipidemia, diabetes and vitamin D. His blood pressure has not been controlled at home,  today their BP is BP: 130/60 He does not workout. He denies chest pain, shortness of breath, dizziness. 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  He has not smoked for 3 weeks, is on wellbutrin and doing well.   He is on cholesterol medication and denies myalgias. His cholesterol is not at goal. The cholesterol last visit was:   Lab Results  Component Value Date   CHOL 124 (L) 03/13/2016   HDL 33 (L)  03/13/2016   LDLCALC 73 03/13/2016   TRIG 88 03/13/2016   CHOLHDL 3.8 03/13/2016   He has been working on diet and exercise for Diabetes, he was put on invokamet but could not tolerate it due to yeast infections, he is on 1 MF a day and he takes glimepiride morning and night, and denies polydipsia, polyuria and visual disturbances. Last A1C in the office was:  Lab Results  Component Value Date   HGBA1C 6.8 (H) 03/13/2016   Lab Results  Component Value Date   GFRNONAA 76 03/13/2016   Patient is on Vitamin D supplement, 10,000 daily. Lab Results  Component Value Date   VD25OH 38 03/13/2016     BMI is Body mass index is 38.31 kg/m., he is working on diet and exercise. Wt Readings from Last 3 Encounters:  07/03/16 263 lb 3.2 oz (119.4 kg)  03/13/16 264 lb 4.8 oz (119.9 kg)  11/01/15 259 lb (117.5 kg)     Current Medications:  Current Outpatient Prescriptions on File Prior to Visit  Medication Sig Dispense Refill  . budesonide-formoterol (SYMBICORT) 160-4.5 MCG/ACT inhaler Inhale 2 puffs into the lungs 2 (two) times daily. 1 Inhaler 12  . buPROPion (WELLBUTRIN XL) 150 MG 24 hr tablet TAKE 1 TABLET (150 MG TOTAL) BY MOUTH EVERY MORNING. 30 tablet 2  . Cholecalciferol (VITAMIN D PO) Take 5,000 Int'l Units by mouth daily.    . fluticasone (FLONASE) 50 MCG/ACT nasal spray USE 1 SPRAY(S) INTRANASALLY ONCE A DAY  0  . glimepiride (AMARYL) 4 MG tablet TAKE 1/2 TO 1 TAB 2 X DAILY WITH A MEAL FOR DIABETES. 180 tablet 1  . glucose  blood test strip 1 each by Other route as needed for other. Test Blood Sugar Once daily 100 each PRN  . losartan (COZAAR) 50 MG tablet Take 1 tablet (50 mg total) by mouth daily. 30 tablet 11  . lovastatin (MEVACOR) 20 MG tablet Take 1 tablet (20 mg total) by mouth at bedtime. 90 tablet 1  . metFORMIN (GLUCOPHAGE-XR) 500 MG 24 hr tablet TAKE 2 TABLETS BY MOUTH TWICE A DAY WITH A MEAL 360 tablet 1  . PROAIR HFA 108 (90 Base) MCG/ACT inhaler Inhale 1-2 puffs into the  lungs every 6 (six) hours as needed for wheezing or shortness of breath. 18 g 5  . tadalafil (CIALIS) 20 MG tablet TAKE 1 TABLET (20 MG TOTAL) BY MOUTH DAILY AS NEEDED. 6 tablet 3   No current facility-administered medications on file prior to visit.    Medical History:  Past Medical History:  Diagnosis Date  . Asthma   . Erectile dysfunction   . Hyperlipidemia   . Hypertension   . Other testicular hypofunction   . Type II or unspecified type diabetes mellitus without mention of complication, not stated as uncontrolled    Allergies:  Allergies  Allergen Reactions  . Flax [Bio-Flax]     Review of Systems  Constitutional: Negative.   HENT: Negative.   Eyes: Negative.   Respiratory: Negative.   Cardiovascular: Negative.   Gastrointestinal: Negative.   Genitourinary: Negative.   Musculoskeletal: Positive for back pain and myalgias. Negative for falls, joint pain and neck pain.  Skin: Negative.   Neurological: Negative.   Endo/Heme/Allergies: Negative.   Psychiatric/Behavioral: Negative.      Family history- Review and unchanged Social history- Review and unchanged Physical Exam: BP 130/60   Pulse 62   Temp 97.6 F (36.4 C)   Resp 16   Ht 5' 9.5" (1.765 m)   Wt 263 lb 3.2 oz (119.4 kg)   SpO2 97%   BMI 38.31 kg/m  Wt Readings from Last 3 Encounters:  07/03/16 263 lb 3.2 oz (119.4 kg)  03/13/16 264 lb 4.8 oz (119.9 kg)  11/01/15 259 lb (117.5 kg)   General Appearance: Well nourished, in no apparent distress. Eyes: PERRLA, EOMs, conjunctiva no swelling or erythema Sinuses: No Frontal/maxillary tenderness ENT/Mouth: Ext aud canals clear, TMs without erythema, bulging. No erythema, swelling, or exudate on post pharynx.  Tonsils not swollen or erythematous. Hearing normal.  Neck: Supple, thyroid normal.  Respiratory: Respiratory effort normal, BS equal bilaterally without rales, rhonchi, wheezing or stridor.  Cardio: RRR with no MRGs. Brisk peripheral pulses without  edema.  Abdomen: Soft, + BS.  Non tender, no guarding, rebound, hernias, masses. Lymphatics: Non tender without lymphadenopathy.  Musculoskeletal: Full ROM, 5/5 strength, normal gait.  Skin: Warm, dry without rashes, lesions, ecchymosis.  Neuro: Cranial nerves intact. No cerebellar symptoms. Sensation intact.  Psych: Awake and oriented X 3, normal affect, Insight and Judgment appropriate.    Vicie Mutters 8:55 AM

## 2016-07-03 NOTE — Patient Instructions (Signed)
    Bad carbs also include fruit juice, alcohol, and sweet tea. These are empty calories that do not signal to your brain that you are full.   Please remember the good carbs are still carbs which convert into sugar. So please measure them out no more than 1/2-1 cup of rice, oatmeal, pasta, and beans  Veggies are however free foods! Pile them on.   Not all fruit is created equal. Please see the list below, the fruit at the bottom is higher in sugars than the fruit at the top. Please avoid all dried fruits.     We want weight loss that will last so you should lose 1-2 pounds a week.  THAT IS IT! Please pick THREE things a month to change. Once it is a habit check off the item. Then pick another three items off the list to become habits.  If you are already doing a habit on the list GREAT!  Cross that item off! o Don't drink your calories. Ie, alcohol, soda, fruit juice, and sweet tea.  o Drink more water. Drink a glass when you feel hungry or before each meal.  o Eat breakfast - Complex carb and protein (likeDannon light and fit yogurt, oatmeal, fruit, eggs, turkey bacon). o Measure your cereal.  Eat no more than one cup a day. (ie Kashi) o Eat an apple a day. o Add a vegetable a day. o Try a new vegetable a month. o Use Pam! Stop using oil or butter to cook. o Don't finish your plate or use smaller plates. o Share your dessert. o Eat sugar free Jello for dessert or frozen grapes. o Don't eat 2-3 hours before bed. o Switch to whole wheat bread, pasta, and brown rice. o Make healthier choices when you eat out. No fries! o Pick baked chicken, NOT fried. o Don't forget to SLOW DOWN when you eat. It is not going anywhere.  o Take the stairs. o Park far away in the parking lot o Lift soup cans (or weights) for 10 minutes while watching TV. o Walk at work for 10 minutes during break. o Walk outside 1 time a week with your friend, kids, dog, or significant other. o Start a walking group at  church. o Walk the mall as much as you can tolerate.  o Keep a food diary. o Weigh yourself daily. o Walk for 15 minutes 3 days per week. o Cook at home more often and eat out less.  If life happens and you go back to old habits, it is okay.  Just start over. You can do it!   If you experience chest pain, get short of breath, or tired during the exercise, please stop immediately and inform your doctor.  .  

## 2016-07-04 LAB — VITAMIN D 25 HYDROXY (VIT D DEFICIENCY, FRACTURES): VIT D 25 HYDROXY: 30 ng/mL (ref 30–100)

## 2016-07-04 LAB — TSH: TSH: 2.88 m[IU]/L (ref 0.40–4.50)

## 2016-09-19 ENCOUNTER — Other Ambulatory Visit: Payer: Self-pay | Admitting: Physician Assistant

## 2016-09-19 DIAGNOSIS — J4520 Mild intermittent asthma, uncomplicated: Secondary | ICD-10-CM

## 2016-10-09 ENCOUNTER — Ambulatory Visit: Payer: Self-pay | Admitting: Physician Assistant

## 2016-10-26 ENCOUNTER — Other Ambulatory Visit: Payer: Self-pay | Admitting: Physician Assistant

## 2016-12-04 ENCOUNTER — Encounter: Payer: Self-pay | Admitting: Physician Assistant

## 2016-12-04 ENCOUNTER — Ambulatory Visit (INDEPENDENT_AMBULATORY_CARE_PROVIDER_SITE_OTHER): Payer: 59 | Admitting: Physician Assistant

## 2016-12-04 ENCOUNTER — Other Ambulatory Visit: Payer: Self-pay | Admitting: Physician Assistant

## 2016-12-04 VITALS — BP 140/80 | HR 84 | Temp 97.9°F | Resp 14 | Ht 69.5 in | Wt 264.0 lb

## 2016-12-04 DIAGNOSIS — Z79899 Other long term (current) drug therapy: Secondary | ICD-10-CM

## 2016-12-04 DIAGNOSIS — E291 Testicular hypofunction: Secondary | ICD-10-CM

## 2016-12-04 DIAGNOSIS — N521 Erectile dysfunction due to diseases classified elsewhere: Secondary | ICD-10-CM | POA: Diagnosis not present

## 2016-12-04 DIAGNOSIS — J449 Chronic obstructive pulmonary disease, unspecified: Secondary | ICD-10-CM

## 2016-12-04 DIAGNOSIS — E1169 Type 2 diabetes mellitus with other specified complication: Secondary | ICD-10-CM

## 2016-12-04 DIAGNOSIS — I10 Essential (primary) hypertension: Secondary | ICD-10-CM | POA: Diagnosis not present

## 2016-12-04 DIAGNOSIS — E119 Type 2 diabetes mellitus without complications: Secondary | ICD-10-CM | POA: Diagnosis not present

## 2016-12-04 DIAGNOSIS — E785 Hyperlipidemia, unspecified: Secondary | ICD-10-CM

## 2016-12-04 LAB — BASIC METABOLIC PANEL WITH GFR
BUN: 17 mg/dL (ref 7–25)
CALCIUM: 9.4 mg/dL (ref 8.6–10.3)
CO2: 26 mmol/L (ref 20–31)
CREATININE: 1.13 mg/dL (ref 0.70–1.33)
Chloride: 102 mmol/L (ref 98–110)
GFR, EST AFRICAN AMERICAN: 82 mL/min (ref >=60)
GFR, Est Non African American: 71 mL/min (ref >=60)
Glucose, Bld: 187 mg/dL — ABNORMAL HIGH (ref 65–99)
Potassium: 5 mmol/L (ref 3.5–5.3)
Sodium: 136 mmol/L (ref 135–146)

## 2016-12-04 LAB — CBC WITH DIFFERENTIAL/PLATELET
BASOS PCT: 0 %
Basophils Absolute: 0 cells/uL (ref 0–200)
Eosinophils Absolute: 178 cells/uL (ref 15–500)
Eosinophils Relative: 2 %
HCT: 44.9 % (ref 38.5–50.0)
Hemoglobin: 14.9 g/dL (ref 13.2–17.1)
LYMPHS PCT: 30 %
Lymphs Abs: 2670 cells/uL (ref 850–3900)
MCH: 28.9 pg (ref 27.0–33.0)
MCHC: 33.2 g/dL (ref 32.0–36.0)
MCV: 87.2 fL (ref 80.0–100.0)
MONOS PCT: 7 %
MPV: 9.5 fL (ref 7.5–12.5)
Monocytes Absolute: 623 cells/uL (ref 200–950)
NEUTROS PCT: 61 %
Neutro Abs: 5429 cells/uL (ref 1500–7800)
PLATELETS: 236 10*3/uL (ref 140–400)
RBC: 5.15 MIL/uL (ref 4.20–5.80)
RDW: 13.4 % (ref 11.0–15.0)
WBC: 8.9 10*3/uL (ref 3.8–10.8)

## 2016-12-04 LAB — HEPATIC FUNCTION PANEL
ALBUMIN: 4.2 g/dL (ref 3.6–5.1)
ALT: 14 U/L (ref 9–46)
AST: 14 U/L (ref 10–35)
Alkaline Phosphatase: 65 U/L (ref 40–115)
BILIRUBIN TOTAL: 0.5 mg/dL (ref 0.2–1.2)
Bilirubin, Direct: 0.1 mg/dL (ref <=0.2)
Indirect Bilirubin: 0.4 mg/dL (ref 0.2–1.2)
Total Protein: 7.2 g/dL (ref 6.1–8.1)

## 2016-12-04 LAB — LIPID PANEL
Cholesterol: 179 mg/dL (ref <200)
HDL: 32 mg/dL — ABNORMAL LOW (ref >40)
LDL Cholesterol: 122 mg/dL — ABNORMAL HIGH (ref <100)
TRIGLYCERIDES: 124 mg/dL (ref <150)
Total CHOL/HDL Ratio: 5.6 Ratio — ABNORMAL HIGH (ref <5.0)
VLDL: 25 mg/dL (ref <30)

## 2016-12-04 LAB — TSH: TSH: 2.27 m[IU]/L (ref 0.40–4.50)

## 2016-12-04 MED ORDER — FLUTICASONE FUROATE-VILANTEROL 100-25 MCG/INH IN AEPB: 1.0000 | INHALATION_SPRAY | Freq: Every day | RESPIRATORY_TRACT | 3 refills | Status: DC

## 2016-12-04 NOTE — Progress Notes (Signed)
Assessment and Plan:  Essential hypertension - continue medications, DASH diet, exercise and monitor at home. Call if greater than 130/80.  - CBC with Differential/Platelet - BASIC METABOLIC PANEL WITH GFR - Hepatic function panel - TSH  Diabetes mellitus without complication ([place]) Discussed general issues about diabetes pathophysiology and management., Educational material distributed., Suggested low cholesterol diet., Encouraged aerobic exercise., Discussed foot care., Reminded to get yearly retinal exam. - Hemoglobin A1c   Hyperlipidemia -continue medications, check lipids, decrease fatty foods, increase activity.  - Lipid panel  Morbid obesity, unspecified obesity type ([place]) Obesity with co morbidities- long discussion about weight loss, diet, and exercise - Lipid panel - Hemoglobin A1c   Vitamin D deficiency - VITAMIN D 25 Hydroxy (Vit-D Deficiency, Fractures)  Medication management - Magnesium  Chronic obstructive pulmonary disease, unspecified COPD type ([facility]) Given breo, continue wellbutrin, stop smoking  Hypogonadism - continue to monitor, weight loss advised  Continue diet and meds as discussed. Further disposition pending results of labs. Discussed med's effects and SE's.    HPI 61 y.o. male  presents for 3 month follow up with hypertension, hyperlipidemia, diabetes and vitamin D. His blood pressure has not been controlled at home,  today their BP is BP: 140/80 He does not workout. He denies chest pain, shortness of breath, dizziness. 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  He is smoking again, will stop for 3week or longest 3 months and than he will start back up.  He is on cholesterol medication and denies myalgias. His cholesterol is not at goal. The cholesterol last visit was:   Lab Results  Component Value Date   CHOL 164 07/03/2016   HDL 34 (L) 07/03/2016   LDLCALC 108 07/03/2016   TRIG 110 07/03/2016    CHOLHDL 4.8 07/03/2016   He has been working on diet and exercise for Diabetes, he was put on invokamet but could not tolerate it due to yeast infections, he is on 1 MF a day and he takes glimepiride morning and night, and denies polydipsia, polyuria and visual disturbances. Last A1C in the office was:  Lab Results  Component Value Date   HGBA1C 7.0 (H) 07/03/2016    Lab Results  Component Value Date   GFRNONAA 62 07/03/2016   Patient is on Vitamin D supplement, 10,000 daily. Lab Results  Component Value Date   VD25OH 30 07/03/2016     BMI is Body mass index is 38.43 kg/m., he is working on diet and exercise. Wt Readings from Last 3 Encounters:  12/04/16 264 lb (119.7 kg)  07/03/16 263 lb 3.2 oz (119.4 kg)  03/13/16 264 lb 4.8 oz (119.9 kg)     Current Medications:  Current Outpatient Prescriptions on File Prior to Visit  Medication Sig Dispense Refill  . budesonide-formoterol (SYMBICORT) 160-4.5 MCG/ACT inhaler Inhale 2 puffs into the lungs 2 (two) times daily. 1 Inhaler 12  . buPROPion (WELLBUTRIN XL) 150 MG 24 hr tablet TAKE 1 TABLET EVERY MORNING 90 tablet 1  . Cholecalciferol (VITAMIN D PO) Take 5,000 Int'l Units by mouth daily.    Marland Kitchen CIALIS 20 MG tablet TAKE 1 TABLET BY MOUTH DAILY AS NEEDED 6 tablet 3  . fluticasone (FLONASE) 50 MCG/ACT nasal spray USE 1 SPRAY(S) INTRANASALLY ONCE A DAY  0  . glucose blood test strip 1 each by Other route as needed for other. Test Blood Sugar Once daily 100 each PRN  . lovastatin (MEVACOR)  20 MG tablet Take 1 tablet (20 mg total) by mouth at bedtime. 90 tablet 1  . metFORMIN (GLUCOPHAGE-XR) 500 MG 24 hr tablet TAKE 2 TABLETS BY MOUTH TWICE A DAY WITH A MEAL 360 tablet 1  . PROAIR HFA 108 (90 Base) MCG/ACT inhaler INHALE 1-2 PUFFS INTO THE LUNGS EVERY 6 (SIX) HOURS AS NEEDED FOR WHEEZING OR SHORTNESS OF BREATH. 8.5 Inhaler 3   No current facility-administered medications on file prior to visit.    Medical History:  Past Medical  History:  Diagnosis Date  . Asthma   . Erectile dysfunction   . Hyperlipidemia   . Hypertension   . Other testicular hypofunction   . Type II or unspecified type diabetes mellitus without mention of complication, not stated as uncontrolled    Allergies:  Allergies  Allergen Reactions  . Flax [Bio-Flax]     Review of Systems  Constitutional: Negative.   HENT: Negative.   Eyes: Negative.   Respiratory: Negative.   Cardiovascular: Negative.   Gastrointestinal: Negative.   Genitourinary: Negative.   Musculoskeletal: Positive for back pain. Negative for falls, joint pain, myalgias and neck pain.  Skin: Negative.   Neurological: Negative.   Endo/Heme/Allergies: Negative.   Psychiatric/Behavioral: Negative.      Family history- Review and unchanged Social history- Review and unchanged Physical Exam: BP 140/80   Pulse 84   Temp 97.9 F (36.6 C)   Resp 14   Ht 5' 9.5" (1.765 m)   Wt 264 lb (119.7 kg)   SpO2 97%   BMI 38.43 kg/m  Wt Readings from Last 3 Encounters:  12/04/16 264 lb (119.7 kg)  07/03/16 263 lb 3.2 oz (119.4 kg)  03/13/16 264 lb 4.8 oz (119.9 kg)   General Appearance: Well nourished, in no apparent distress. Eyes: PERRLA, EOMs, conjunctiva no swelling or erythema Sinuses: No Frontal/maxillary tenderness ENT/Mouth: Ext aud canals clear, TMs without erythema, bulging. No erythema, swelling, or exudate on post pharynx.  Tonsils not swollen or erythematous. Hearing normal.  Neck: Supple, thyroid normal.  Respiratory: Respiratory effort normal, BS equal bilaterally without rales, rhonchi, wheezing or stridor.  Cardio: RRR with no MRGs. Brisk peripheral pulses without edema.  Abdomen: Soft, + BS.  Non tender, no guarding, rebound, hernias, masses. Lymphatics: Non tender without lymphadenopathy.  Musculoskeletal: Full ROM, 5/5 strength, normal gait.  Skin: Warm, dry without rashes, lesions, ecchymosis.  Neuro: Cranial nerves intact. No cerebellar symptoms.  Sensation intact.  Psych: Awake and oriented X 3, normal affect, Insight and Judgment appropriate.    Vicie Mutters 10:37 AM

## 2016-12-04 NOTE — Patient Instructions (Addendum)
Diabetes is a very complicated disease...lets simplify it.  An easy way to look at it to understand the complications is if you think of the extra sugar floating in your blood stream as glass shards floating through your blood stream.    Diabetes affects your small vessels first: 1) The glass shards (sugar) scraps down the tiny blood vessels in your eyes and lead to diabetic retinopathy, the leading cause of blindness in the [place]. Diabetes is the leading cause of newly diagnosed adult (20 to 59 years of age) blindness in the [place].  2) The glass shards scratches down the tiny vessels of your legs leading to nerve damage called neuropathy and can lead to amputations of your feet. More than 60% of all non-traumatic amputations of lower limbs occur in people with diabetes.  3) Over time the small vessels in your brain are shredded and closed off, individually this does not cause any problems but over a long period of time many of the small vessels being blocked can lead to Vascular Dementia.   4) Your kidney's are a filter system and have a "net" that keeps certain things in the body and lets bad things out. Sugar shreds this net and leads to kidney damage and eventually failure. Decreasing the sugar that is destroying the net and certain blood pressure medications can help stop or decrease progression of kidney disease. Diabetes was the primary cause of kidney failure in 44 percent of all new cases in 2011.  5) Diabetes also destroys the small vessels in your penis that lead to erectile dysfunction. Eventually the vessels are so damaged that you may not be responsive to cialis or viagra.   Diabetes and your large vessels: Your larger vessels consist of your coronary arteries in your heart and the carotid vessels to your brain. Diabetes or even increased sugars put you at 300% increased risk of heart attack and stroke and this is why.. The sugar scrapes down your large blood vessels and your body  sees this as an internal injury and tries to repair itself. Just like you get a scab on your skin, your platelets will stick to the blood vessel wall trying to heal it. This is why we have diabetics on low dose aspirin daily, this prevents the platelets from sticking and can prevent plaque formation. In addition, your body takes cholesterol and tries to shove it into the open wound. This is why we want your LDL, or bad cholesterol, below 70.   The combination of platelets and cholesterol over 5-10 years forms plaque that can break off and cause a heart attack or stroke.   PLEASE REMEMBER:  Diabetes is preventable! Up to 85 percent of complications and morbidities among individuals with type 2 diabetes can be prevented, delayed, or effectively treated and minimized with regular visits to a health professional, appropriate monitoring and medication, and a healthy diet and lifestyle.     Bad carbs also include fruit juice, alcohol, and sweet tea. These are empty calories that do not signal to your brain that you are full.   Please remember the good carbs are still carbs which convert into sugar. So please measure them out no more than 1/2-1 cup of rice, oatmeal, pasta, and beans  Veggies are however free foods! Pile them on.   Not all fruit is created equal. Please see the list below, the fruit at the bottom is higher in sugars than the fruit at the top. Please avoid all dried fruits.       Chronic Kidney Disease, Adult Chronic kidney disease (CKD) occurs when the kidneys are damaged during a period of 3 or more months. The kidneys are two organs that do many important jobs in the body, which include:  Removing wastes and extra fluids from the blood.  Making hormones that maintain the amount of fluid in your tissues and blood vessels.  Maintaining the right amount of fluids and chemicals in the body. A small amount of kidney damage may not cause problems, but a large amount of damage may make  it difficult or impossible for the kidneys to work the way they should. If steps are not taken to slow down the kidney damage or stop it from getting worse, the kidneys may stop working permanently (end stage kidney disease). Most of the time, CKD does not go away, but it can often be controlled. People who have CKD can usually live normal lives. What are the causes? The most common causes of this condition are diabetes and high blood pressure (hypertension). Other causes include:  Heart and blood vessel (cardiovascular) disease.  Kidney diseases:  Glomerulonephritis.  Interstitial nephritis.  Polycystic kidney disease.  Renal vascular disease.  Diseases that affect the immune system.  Genetic diseases.  Medicines that damage the kidneys, such as anti-inflammatory medicines.  Poisoning.  Being around or in contact with poisonous (toxic) substances.  A kidney or urinary infection that occurs again (recurs).  Vasculitis.  Repeat kidney infections.  A problem with urine flow that may be caused by:  Cancer.  Having kidney stones more than one time.  An enlarged prostate in males. What increases the risk? This condition is more likely to develop in people who are:  Older than age 69.  Male.  Of African-American descent.  Current smokers or former smokers.  Obese. You may also have an increased risk for CKD if you have a family history of CKDor youfrequently take medicines that are damaging to the kidneys. What are the signs or symptoms? Symptoms develop slowly and may not be obvious until the kidney damage becomes severe. It is possible to have a kidney disease for years without showing any symptoms. Symptoms of this condition can include:  Swelling (edema) of the face, legs, ankles, or feet.  Numbness, tingling, or loss of feeling (sensation) in the hands or feet.  Tiredness (lethargy).  Nausea or vomiting.  Confusion or trouble  concentrating.  Problems with urination, such as:  Painful or burning feeling during urination.  Decreased urine production.  Frequent urination, especially at night.  Bloody urine.  Muscle twitches and cramps, especially in the legs.  Shortness of breath.  Weakness.  Constant itchiness.  Loss of appetite.  Metallic taste in the mouth.  Trouble sleeping.  Pale lining of the eyelids and surface of the eye (conjunctiva). How is this diagnosed? This condition may be diagnosed with various tests. Tests may include:  Blood tests.  Urine tests.  Imaging tests.  A test in which a sample of tissue is removed from the kidneys to be looked at under a microscope (kidney biopsy). These test results will help your health care provider determine what class of CKD you have. How is this treated? Most cases of CKD cannot be cured. Treatment usually involves relieving symptoms and preventing or slowing the progression of the disease. Treatment may include:  A special diet, which may require you to avoid alcohol, salty foods (sodium), and foods that are high in potassium, calcium, and protein.  Medicines:  To lower  blood pressure.  To relieve low blood count (anemia).  To relieve swelling.  To protect your bones.  To improve the balance of electrolytes in your blood.  Removing toxic waste from the body by using hemodialysis or peritoneal dialysis if the kidneys can no longer do their job (kidney failure).  Management of other conditions that are causing your CKD or making it worse. Follow these instructions at home:  Follow your prescribed diet.  Take over-the-counter and prescription medicines only as told by your health care provider.  Do not take any new medicines unless approved by your health care provider. Many medicines can worsen your kidney damage.  Do not take any vitamin and mineral supplements unless approved by your health care provider. Many nutritional  supplements can worsen your kidney damage.  The dose of some medicines that you take may need to be adjusted.  Do not use any tobacco products, such as cigarettes, chewing tobacco, and e-cigarettes. If you need help quitting, ask your health care provider.  Keep all follow-up visits as told by your health care provider. This is important.  Keep track of your blood pressure. Report changes in your blood pressure as told by your health care provider.  Achieve and maintain a healthy weight. If you need help with this, ask your health care provider.  Start or continue an exercise plan. Try to exercise at least 30 minutes a day, 5 days a week.  Stay current with immunizations as told by your health care provider. Where to find more information:  American Association of Kidney Patients: BombTimer.gl  National Kidney Foundation: www.kidney.[place]: https://mathis.com/  [facility]: www.lifeoptions.org and www.kidneyschool.org Contact a health care provider if:  Your symptoms get worse.  You develop new symptoms. Get help right away if:  You develop symptoms of end-stage kidney disease, which include:  Headaches.  Abnormally dark or light skin.  Numbness in the hands or feet.  Easy bruising.  Frequent hiccups.  Chest pain.  Shortness of breath.  End of menstruation in women.  You have a fever.  You have decreased urine production.  You have pain or bleeding when you urinate. This information is not intended to replace advice given to you by your health care provider. Make sure you discuss any questions you have with your health care provider. Document Released: 07/18/2008 Document Revised: 03/16/2016 Document Reviewed: 06/07/2012 Elsevier Interactive Patient Education  2017 [place].  9 Ways to Naturally Increase Testosterone Levels  1.   Lose Weight If you're overweight, shedding the excess pounds may increase your  testosterone levels, according to research presented at the Endocrine Society's 2012 meeting. Overweight men are more likely to have low testosterone levels to begin with, so this is an important trick to increase your body's testosterone production when you need it most.  2.   High-Intensity Exercise like Peak Fitness  Short intense exercise has a proven positive effect on increasing testosterone levels and preventing its decline. That's unlike aerobics or prolonged moderate exercise, which have shown to have negative or no effect on testosterone levels. Having a whey protein meal after exercise can further enhance the satiety/testosterone-boosting impact (hunger hormones cause the opposite effect on your testosterone and libido). Here's a summary of what a typical high-intensity Peak Fitness routine might look like: " Warm up for three minutes  " Exercise as hard and fast as you can for 30 seconds. You should feel like you couldn't possibly go on another few  seconds  " Recover at a slow to moderate pace for 90 seconds  " Repeat the high intensity exercise and recovery 7 more times .  3.   Consume Plenty of Zinc The mineral zinc is important for testosterone production, and supplementing your diet for as little as six weeks has been shown to cause a marked improvement in testosterone among men with low levels.1 Likewise, research has shown that restricting dietary sources of zinc leads to a significant decrease in testosterone, while zinc supplementation increases it2 -- and even protects men from exercised-induced reductions in testosterone levels.3 It's estimated that up to 47 percent of adults over the age of 60 may have lower than recommended zinc intakes; even when dietary supplements were added in, an estimated 20-25 percent of older adults still had inadequate zinc intakes, according to a [employer] and Nutrition Examination Survey.4 Your diet is the best source of zinc; along with  protein-rich foods like meats and fish, other good dietary sources of zinc include raw milk, raw cheese, beans, and yogurt or kefir made from raw milk. It can be difficult to obtain enough dietary zinc if you're a vegetarian, and also for meat-eaters as well, largely because of conventional farming methods that rely heavily on chemical fertilizers and pesticides. These chemicals deplete the soil of nutrients ... nutrients like zinc that must be absorbed by plants in order to be passed on to you. In many cases, you may further deplete the nutrients in your food by the way you prepare it. For most food, cooking it will drastically reduce its levels of nutrients like zinc . particularly over-cooking, which many people do. If you decide to use a zinc supplement, stick to a dosage of less than 40 mg a day, as this is the recommended adult upper limit. Taking too much zinc can interfere with your body's ability to absorb other minerals, especially copper, and may cause nausea as a side effect.  4.   Strength Training In addition to Peak Fitness, strength training is also known to boost testosterone levels, provided you are doing so intensely enough. When strength training to boost testosterone, you'll want to increase the weight and lower your number of reps, and then focus on exercises that work a large number of muscles, such as dead lifts or squats.  You can "turbo-charge" your weight training by going slower. By slowing down your movement, you're actually turning it into a high-intensity exercise. Super Slow movement allows your muscle, at the microscopic level, to access the maximum number of cross-bridges between the protein filaments that produce movement in the muscle.   5.   Optimize Your Vitamin D Levels Vitamin D, a steroid hormone, is essential for the healthy development of the nucleus of the sperm cell, and helps maintain semen quality and sperm count. Vitamin D also increases levels of  testosterone, which may boost libido. In one study, overweight men who were given vitamin D supplements had a significant increase in testosterone levels after one year.5   6.   Reduce Stress When you're under a lot of stress, your body releases high levels of the stress hormone cortisol. This hormone actually blocks the effects of testosterone,6 presumably because, from a biological standpoint, testosterone-associated behaviors (mating, competing, aggression) may have lowered your chances of survival in an emergency (hence, the "fight or flight" response is dominant, courtesy of cortisol).  7.   Limit or Eliminate Sugar from Your Diet Testosterone levels decrease after you eat sugar, which is  likely because the sugar leads to a high insulin level, another factor leading to low testosterone.7 Based on USDA estimates, the average American consumes 12 teaspoons of sugar a day, which equates to about TWO TONS of sugar during a lifetime.  8.   Eat Healthy Fats By healthy, this means not only mon- and polyunsaturated fats, like that found in avocadoes and nuts, but also saturated, as these are essential for building testosterone. Research shows that a diet with less than 40 percent of energy as fat (and that mainly from animal sources, i.e. saturated) lead to a decrease in testosterone levels.8 My personal diet is about 60-70 percent healthy fat, and other experts agree that the ideal diet includes somewhere between 50-70 percent fat.  It's important to understand that your body requires saturated fats from animal and vegetable sources (such as meat, dairy, certain oils, and tropical plants like coconut) for optimal functioning, and if you neglect this important food group in favor of sugar, grains and other starchy carbs, your health and weight are almost guaranteed to suffer. Examples of healthy fats you can eat more of to give your testosterone levels a boost include: Olives and Olive oil  Coconuts and  coconut oil Butter made from raw grass-fed organic milk Raw nuts, such as, almonds or pecans Organic pastured egg yolks Avocados Grass-fed meats Palm oil Unheated organic nut oils   9.   Boost Your Intake of Branch Chain Amino Acids (BCAA) from Foods Like [place] suggests that BCAAs result in higher testosterone levels, particularly when taken along with resistance training.9 While BCAAs are available in supplement form, you'll find the highest concentrations of BCAAs like leucine in dairy products - especially quality cheeses and whey protein. Even when getting leucine from your natural food supply, it's often wasted or used as a building block instead of an anabolic agent. So to create the correct anabolic environment, you need to boost leucine consumption way beyond mere maintenance levels. That said, keep in mind that using leucine as a free form amino acid can be highly counterproductive as when free form amino acids are artificially administrated, they rapidly enter your circulation while disrupting insulin function, and impairing your body's glycemic control. Food-based leucine is really the ideal form that can benefit your muscles without side effects.

## 2016-12-05 LAB — TESTOSTERONE: Testosterone: 210 ng/dL — ABNORMAL LOW (ref 250–827)

## 2016-12-05 LAB — HEMOGLOBIN A1C
Hgb A1c MFr Bld: 8 % — ABNORMAL HIGH (ref <5.7)
Mean Plasma Glucose: 183 mg/dL

## 2016-12-05 LAB — MAGNESIUM: Magnesium: 1.7 mg/dL (ref 1.5–2.5)

## 2016-12-05 NOTE — Progress Notes (Signed)
Per pt's request lab results were left on voice mail. Per pt's request lab results were mailed out w/ an explanation of results.

## 2016-12-18 ENCOUNTER — Telehealth: Payer: Self-pay | Admitting: Physician Assistant

## 2017-01-14 ENCOUNTER — Other Ambulatory Visit: Payer: Self-pay | Admitting: Physician Assistant

## 2017-01-14 DIAGNOSIS — J4520 Mild intermittent asthma, uncomplicated: Secondary | ICD-10-CM

## 2017-03-08 NOTE — Progress Notes (Signed)
Assessment and Plan:  Essential hypertension - continue medications, DASH diet, exercise and monitor at home. Call if greater than 130/80.  - CBC with Differential/Platelet - BASIC METABOLIC PANEL WITH GFR - Hepatic function panel - TSH  Diabetes mellitus without complication ([place]) Discussed general issues about diabetes pathophysiology and management., Educational material distributed., Suggested low cholesterol diet., Encouraged aerobic exercise., Discussed foot care., Reminded to get yearly retinal exam. - discussed hypoglycemia, start to check sugars, take 1/2 amaryl pending sugar - Hemoglobin A1c   Hyperlipidemia -continue medications, check lipids, decrease fatty foods, increase activity.  - Lipid panel  Morbid obesity, unspecified obesity type ([place]) Obesity with co morbidities- long discussion about weight loss, diet, and exercise - Lipid panel - Hemoglobin A1c   Vitamin D deficiency - VITAMIN D 25 Hydroxy (Vit-D Deficiency, Fractures)  Medication management - Magnesium  Chronic obstructive pulmonary disease, unspecified COPD type ([facility]) Better breo, continue wellbutrin, stop smoking  Hypogonadism - continue to monitor, weight loss advised  Continue diet and meds as discussed. Further disposition pending results of labs. Discussed med's effects and SE's.    HPI 61 y.o. male  presents for 3 month follow up with hypertension, hyperlipidemia, diabetes and vitamin D. His blood pressure has not been controlled at home,  today their BP is BP: 124/70 He does not workout. He denies chest pain, shortness of breath, dizziness. 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  He is smoking again, will stop for 3week or longest 3 months and than he will start back up.  He is on cholesterol medication and denies myalgias. His cholesterol is not at goal. The  cholesterol last visit was:   Lab Results  Component Value Date   CHOL 179 12/04/2016   HDL 32 (L) 12/04/2016   LDLCALC 122 (H) 12/04/2016   TRIG 124 12/04/2016   CHOLHDL 5.6 (H) 12/04/2016   He has been working on diet and exercise for Diabetes with CKD, he was put on invokamet but could not tolerate it due to yeast infections, he is on 1 MF a day and he takes glimepiride morning and night, occ will not take night time dose, does not check sugars, has had occ feeling of low BS, and denies polydipsia, polyuria and visual disturbances. Last A1C in the office was:  Lab Results  Component Value Date   HGBA1C 8.0 (H) 12/04/2016   Lab Results  Component Value Date   GFRNONAA 71 12/04/2016   Patient is on Vitamin D supplement, 10,000 daily. Lab Results  Component Value Date   VD25OH 30 07/03/2016     BMI is Body mass index is 37.5 kg/m., he is working on diet and exercise. Working nights again, cooking on weekend and taking lunch with him.  Wt Readings from Last 3 Encounters:  03/12/17 257 lb 9.6 oz (116.8 kg)  12/04/16 264 lb (119.7 kg)  07/03/16 263 lb 3.2 oz (119.4 kg)     Current Medications:  Current Outpatient Prescriptions on File Prior to Visit  Medication Sig Dispense Refill  . buPROPion (WELLBUTRIN XL) 150 MG 24 hr tablet TAKE 1 TABLET EVERY MORNING 90 tablet 1  . Cholecalciferol (VITAMIN D PO) Take 5,000 Int'l Units by mouth daily.    Marland Kitchen CIALIS 20 MG tablet TAKE 1 TABLET BY MOUTH DAILY AS NEEDED 6 tablet 3  . fluticasone (FLONASE) 50  MCG/ACT nasal spray USE 1 SPRAY(S) INTRANASALLY ONCE A DAY  0  . fluticasone furoate-vilanterol (BREO ELLIPTA) 100-25 MCG/INH AEPB Inhale 1 puff into the lungs daily. Rinse mouth with water after each use 1 each 3  . glimepiride (AMARYL) 4 MG tablet TAKE 1/2 TO 1 TABLET BY MOUTH TWICE DAILY FOR DIABETES 180 tablet 1  . glucose blood test strip 1 each by Other route as needed for other. Test Blood Sugar Once daily 100 each PRN  . losartan  (COZAAR) 50 MG tablet TAKE 1 TABLET BY MOUTH EVERY DAY 90 tablet 1  . lovastatin (MEVACOR) 20 MG tablet Take 1 tablet (20 mg total) by mouth at bedtime. 90 tablet 1  . metFORMIN (GLUCOPHAGE-XR) 500 MG 24 hr tablet TAKE 2 TABLETS BY MOUTH TWICE A DAY WITH A MEAL 360 tablet 1  . PROAIR HFA 108 (90 Base) MCG/ACT inhaler INHALE 1-2 PUFFS INTO THE LUNGS EVERY 6 (SIX) HOURS AS NEEDED FOR WHEEZING OR SHORTNESS OF BREATH. 8.5 Inhaler 3   No current facility-administered medications on file prior to visit.    Medical History:  Past Medical History:  Diagnosis Date  . Asthma   . Erectile dysfunction   . Hyperlipidemia   . Hypertension   . Other testicular hypofunction   . Type II or unspecified type diabetes mellitus without mention of complication, not stated as uncontrolled    Allergies:  Allergies  Allergen Reactions  . Flax [Bio-Flax]     Review of Systems  Constitutional: Negative.   HENT: Negative.   Eyes: Negative.   Respiratory: Negative.   Cardiovascular: Negative.   Gastrointestinal: Negative.   Genitourinary: Negative.   Musculoskeletal: Positive for back pain. Negative for falls, joint pain, myalgias and neck pain.  Skin: Negative.   Neurological: Negative.   Endo/Heme/Allergies: Negative.   Psychiatric/Behavioral: Negative.      Family history- Review and unchanged Social history- Review and unchanged Physical Exam: BP 124/70   Pulse 68   Temp 97.7 F (36.5 C)   Resp 16   Ht 5' 9.5" (1.765 m)   Wt 257 lb 9.6 oz (116.8 kg)   SpO2 97%   BMI 37.50 kg/m  Wt Readings from Last 3 Encounters:  03/12/17 257 lb 9.6 oz (116.8 kg)  12/04/16 264 lb (119.7 kg)  07/03/16 263 lb 3.2 oz (119.4 kg)   General Appearance: Well nourished, in no apparent distress. Eyes: PERRLA, EOMs, conjunctiva no swelling or erythema Sinuses: No Frontal/maxillary tenderness ENT/Mouth: Ext aud canals clear, TMs without erythema, bulging. No erythema, swelling, or exudate on post pharynx.   Tonsils not swollen or erythematous. Hearing normal.  Neck: Supple, thyroid normal.  Respiratory: Respiratory effort normal, BS equal bilaterally without rales, rhonchi, wheezing or stridor.  Cardio: RRR with no MRGs. Brisk peripheral pulses without edema.  Abdomen: Soft, + BS.  Non tender, no guarding, rebound, hernias, masses. Lymphatics: Non tender without lymphadenopathy.  Musculoskeletal: Full ROM, 5/5 strength, normal gait.  Skin: Warm, dry without rashes, lesions, ecchymosis.  Neuro: Cranial nerves intact. No cerebellar symptoms. Sensation intact.  Psych: Awake and oriented X 3, normal affect, Insight and Judgment appropriate.    Vicie Mutters 9:38 AM

## 2017-03-12 ENCOUNTER — Encounter: Payer: Self-pay | Admitting: Physician Assistant

## 2017-03-12 ENCOUNTER — Ambulatory Visit (INDEPENDENT_AMBULATORY_CARE_PROVIDER_SITE_OTHER): Payer: 59 | Admitting: Physician Assistant

## 2017-03-12 VITALS — BP 124/70 | HR 68 | Temp 97.7°F | Resp 16 | Ht 69.5 in | Wt 257.6 lb

## 2017-03-12 DIAGNOSIS — E559 Vitamin D deficiency, unspecified: Secondary | ICD-10-CM | POA: Diagnosis not present

## 2017-03-12 DIAGNOSIS — N521 Erectile dysfunction due to diseases classified elsewhere: Secondary | ICD-10-CM

## 2017-03-12 DIAGNOSIS — Z79899 Other long term (current) drug therapy: Secondary | ICD-10-CM

## 2017-03-12 DIAGNOSIS — F17200 Nicotine dependence, unspecified, uncomplicated: Secondary | ICD-10-CM

## 2017-03-12 DIAGNOSIS — E785 Hyperlipidemia, unspecified: Secondary | ICD-10-CM | POA: Diagnosis not present

## 2017-03-12 DIAGNOSIS — E1169 Type 2 diabetes mellitus with other specified complication: Secondary | ICD-10-CM | POA: Diagnosis not present

## 2017-03-12 DIAGNOSIS — J449 Chronic obstructive pulmonary disease, unspecified: Secondary | ICD-10-CM | POA: Diagnosis not present

## 2017-03-12 DIAGNOSIS — I10 Essential (primary) hypertension: Secondary | ICD-10-CM

## 2017-03-12 DIAGNOSIS — E119 Type 2 diabetes mellitus without complications: Secondary | ICD-10-CM

## 2017-03-12 LAB — CBC WITH DIFFERENTIAL/PLATELET
BASOS ABS: 0 {cells}/uL (ref 0–200)
BASOS PCT: 0 %
EOS ABS: 102 {cells}/uL (ref 15–500)
Eosinophils Relative: 1 %
HEMATOCRIT: 44.5 % (ref 38.5–50.0)
Hemoglobin: 14.6 g/dL (ref 13.2–17.1)
Lymphocytes Relative: 28 %
Lymphs Abs: 2856 cells/uL (ref 850–3900)
MCH: 29 pg (ref 27.0–33.0)
MCHC: 32.8 g/dL (ref 32.0–36.0)
MCV: 88.3 fL (ref 80.0–100.0)
MPV: 9.5 fL (ref 7.5–12.5)
Monocytes Absolute: 714 cells/uL (ref 200–950)
Monocytes Relative: 7 %
NEUTROS ABS: 6528 {cells}/uL (ref 1500–7800)
Neutrophils Relative %: 64 %
Platelets: 230 10*3/uL (ref 140–400)
RBC: 5.04 MIL/uL (ref 4.20–5.80)
RDW: 13.9 % (ref 11.0–15.0)
WBC: 10.2 10*3/uL (ref 3.8–10.8)

## 2017-03-12 MED ORDER — NYSTATIN 100000 UNIT/ML MT SUSP: 5.0000 mL | Freq: Four times a day (QID) | OROMUCOSAL | 2 refills | Status: DC

## 2017-03-12 NOTE — Patient Instructions (Addendum)
Please be careful with glimepiride (Amaryl). This medication forces your blood sugar down no matter what it is starting at. Only take 1/2 of the medication if your sugar is above 120 and you can take a whole pill if your sugar is above 150 .Marland Kitchen If at any time you start to have low blood sugars in the morning or during the day please stop this medication. Please never take this medication if you are sick or can not eat. A low blood sugar is much more dangerous than a high blood sugar. Your brain needs two things, sugar and oxygen.    Your A1C is a measure of your sugar over the past 3 months and is not affected by what you have eaten over the past few days. Diabetes increases your chances of stroke and heart attack over 300 % and is the leading cause of blindness and kidney failure in the [place]. Please make sure you decrease bad carbs like white bread, white rice, potatoes, corn, soft drinks, pasta, cereals, refined sugars, sweet tea, dried fruits, and fruit juice. Good carbs are okay to eat in moderation like sweet potatoes, brown rice, whole grain pasta/bread, most fruit (except dried fruit) and you can eat as many veggies as you want.   Greater than 6.5 is considered diabetic. Between 6.4 and 5.7 is prediabetic If your A1C is less than 5.7 you are NOT diabetic.  Targets for Glucose Readings: Time of Check Target for patients WITHOUT Diabetes Target for DIABETICS  Before Meals Less than 100  less than 150  Two hours after meals Less than 200  Less than 250       Bad carbs also include fruit juice, alcohol, and sweet tea. These are empty calories that do not signal to your brain that you are full.   Please remember the good carbs are still carbs which convert into sugar. So please measure them out no more than 1/2-1 cup of rice, oatmeal, pasta, and beans  Veggies are however free foods! Pile them on.   Not all fruit is created equal. Please see the list below, the fruit at the bottom is  higher in sugars than the fruit at the top. Please avoid all dried fruits.    Oral Thrush, Adult Oral thrush is an infection in your mouth and throat. It causes white patches on your tongue and in your mouth. Follow these instructions at home: Helping with soreness  To lessen your pain: Drink cold liquids, like water and iced tea. Eat frozen ice pops or frozen juices. Eat foods that are easy to swallow, like gelatin and ice cream. Drink from a straw if the patches in your mouth are painful. General instructions   Take or use over-the-counter and prescription medicines only as told by your doctor. Medicine for oral thrush may be something to swallow, or it may be something to put on the infected area. Eat plain yogurt that has live cultures in it. Read the label to make sure. If you wear dentures: Take out your dentures before you go to bed. Brush them well. Soak them in a denture cleaner. Rinse your mouth with warm salt-water many times a day. To make the salt-water mixture, completely dissolve 1/2-1 teaspoon of salt in 1 cup of warm water. Contact a doctor if: Your problems are getting worse. Your problems do not get better in less than 7 days with treatment. Your infection is spreading. This may show as white patches on the skin outside of  your mouth. You are nursing your baby and you have redness and pain in the nipples. This information is not intended to replace advice given to you by your health care provider. Make sure you discuss any questions you have with your health care provider. Document Released: 01/03/2010 Document Revised: 07/03/2016 Document Reviewed: 07/03/2016 Elsevier Interactive Patient Education  2017 [employer].

## 2017-03-13 LAB — TSH: TSH: 2.05 mIU/L (ref 0.40–4.50)

## 2017-03-13 LAB — BASIC METABOLIC PANEL WITH GFR
BUN: 14 mg/dL (ref 7–25)
CHLORIDE: 102 mmol/L (ref 98–110)
CO2: 25 mmol/L (ref 20–31)
CREATININE: 1.03 mg/dL (ref 0.70–1.33)
Calcium: 9.1 mg/dL (ref 8.6–10.3)
GFR, EST NON AFRICAN AMERICAN: 80 mL/min (ref >=60)
GFR, Est African American: >89 mL/min (ref >=60)
GLUCOSE: 182 mg/dL — AB (ref 65–99)
Potassium: 4.8 mmol/L (ref 3.5–5.3)
SODIUM: 138 mmol/L (ref 135–146)

## 2017-03-13 LAB — LIPID PANEL
CHOL/HDL RATIO: 5.6 ratio — AB (ref <5.0)
Cholesterol: 173 mg/dL (ref <200)
HDL: 31 mg/dL — AB (ref >40)
LDL CALC: 120 mg/dL — AB (ref <100)
TRIGLYCERIDES: 110 mg/dL (ref <150)
VLDL: 22 mg/dL (ref <30)

## 2017-03-13 LAB — HEPATIC FUNCTION PANEL
ALT: 13 U/L (ref 9–46)
AST: 12 U/L (ref 10–35)
Albumin: 4.1 g/dL (ref 3.6–5.1)
Alkaline Phosphatase: 74 U/L (ref 40–115)
BILIRUBIN DIRECT: 0.1 mg/dL (ref <=0.2)
BILIRUBIN TOTAL: 0.5 mg/dL (ref 0.2–1.2)
Indirect Bilirubin: 0.4 mg/dL (ref 0.2–1.2)
Total Protein: 6.9 g/dL (ref 6.1–8.1)

## 2017-03-13 LAB — HEMOGLOBIN A1C
Hgb A1c MFr Bld: 7.6 % — ABNORMAL HIGH (ref <5.7)
Mean Plasma Glucose: 171 mg/dL

## 2017-03-13 LAB — MAGNESIUM: MAGNESIUM: 1.7 mg/dL (ref 1.5–2.5)

## 2017-03-13 NOTE — Progress Notes (Signed)
Pt aware of lab results & voiced understanding of those results.

## 2017-03-20 ENCOUNTER — Encounter: Payer: Self-pay | Admitting: Physician Assistant

## 2017-03-29 ENCOUNTER — Other Ambulatory Visit: Payer: Self-pay | Admitting: Internal Medicine

## 2017-04-15 ENCOUNTER — Other Ambulatory Visit: Payer: Self-pay | Admitting: Physician Assistant

## 2017-05-16 ENCOUNTER — Other Ambulatory Visit: Payer: Self-pay | Admitting: Internal Medicine

## 2017-05-20 ENCOUNTER — Other Ambulatory Visit: Payer: Self-pay | Admitting: Physician Assistant

## 2017-05-20 DIAGNOSIS — J4520 Mild intermittent asthma, uncomplicated: Secondary | ICD-10-CM

## 2017-06-17 ENCOUNTER — Other Ambulatory Visit: Payer: Self-pay | Admitting: Internal Medicine

## 2017-06-17 DIAGNOSIS — J4520 Mild intermittent asthma, uncomplicated: Secondary | ICD-10-CM

## 2017-06-17 MED ORDER — ALBUTEROL SULFATE HFA 108 (90 BASE) MCG/ACT IN AERS: INHALATION_SPRAY | RESPIRATORY_TRACT | 3 refills | Status: DC

## 2017-07-09 ENCOUNTER — Ambulatory Visit: Payer: Self-pay | Admitting: Physician Assistant

## 2017-08-01 ENCOUNTER — Encounter: Payer: Self-pay | Admitting: Physician Assistant

## 2017-08-15 ENCOUNTER — Other Ambulatory Visit: Payer: Self-pay | Admitting: Internal Medicine

## 2017-08-15 DIAGNOSIS — E119 Type 2 diabetes mellitus without complications: Secondary | ICD-10-CM

## 2017-08-20 ENCOUNTER — Encounter: Payer: Self-pay | Admitting: Physician Assistant

## 2017-08-20 ENCOUNTER — Ambulatory Visit (INDEPENDENT_AMBULATORY_CARE_PROVIDER_SITE_OTHER): Payer: 59 | Admitting: Physician Assistant

## 2017-08-20 VITALS — BP 140/82 | HR 84 | Temp 97.5°F | Resp 18 | Ht 69.5 in | Wt 255.0 lb

## 2017-08-20 DIAGNOSIS — E785 Hyperlipidemia, unspecified: Secondary | ICD-10-CM | POA: Diagnosis not present

## 2017-08-20 DIAGNOSIS — Z79899 Other long term (current) drug therapy: Secondary | ICD-10-CM

## 2017-08-20 DIAGNOSIS — E119 Type 2 diabetes mellitus without complications: Secondary | ICD-10-CM | POA: Diagnosis not present

## 2017-08-20 DIAGNOSIS — J449 Chronic obstructive pulmonary disease, unspecified: Secondary | ICD-10-CM | POA: Diagnosis not present

## 2017-08-20 DIAGNOSIS — I10 Essential (primary) hypertension: Secondary | ICD-10-CM | POA: Diagnosis not present

## 2017-08-20 DIAGNOSIS — F17200 Nicotine dependence, unspecified, uncomplicated: Secondary | ICD-10-CM

## 2017-08-20 DIAGNOSIS — N521 Erectile dysfunction due to diseases classified elsewhere: Secondary | ICD-10-CM

## 2017-08-20 DIAGNOSIS — E1169 Type 2 diabetes mellitus with other specified complication: Secondary | ICD-10-CM

## 2017-08-20 MED ORDER — METFORMIN HCL ER 500 MG PO TB24: ORAL_TABLET | ORAL | 1 refills | Status: DC

## 2017-08-20 MED ORDER — TADALAFIL 20 MG PO TABS: 20.0000 mg | ORAL_TABLET | Freq: Every day | ORAL | 3 refills | Status: DC | PRN

## 2017-08-20 MED ORDER — UMECLIDINIUM-VILANTEROL 62.5-25 MCG/INH IN AEPB: 1.0000 | INHALATION_SPRAY | Freq: Every day | RESPIRATORY_TRACT | 4 refills | Status: DC

## 2017-08-20 NOTE — Patient Instructions (Addendum)
Get chest xray at [facility] hospital any day you are off, just give them your nap.   Monitor your blood pressure at home. Go to the ER if any CP, SOB, nausea, dizziness, severe HA, changes vision/speech  Goal BP:  For patients younger than 60: Goal BP < 140/90. For patients 60 and older: Goal BP < 150/90. For patients with diabetes: Goal BP < 140/90. Your most recent BP: BP: 140/82   Take your medications faithfully as instructed. Maintain a healthy weight. Get at least 150 minutes of aerobic exercise per week. Minimize salt intake. Minimize alcohol intake  DASH Eating Plan DASH stands for "Dietary Approaches to Stop Hypertension." The DASH eating plan is a healthy eating plan that has been shown to reduce high blood pressure (hypertension). Additional health benefits may include reducing the risk of type 2 diabetes mellitus, heart disease, and stroke. The DASH eating plan may also help with weight loss. WHAT DO I NEED TO KNOW ABOUT THE DASH EATING PLAN? For the DASH eating plan, you will follow these general guidelines:  Choose foods with a percent daily value for sodium of less than 5% (as listed on the food label).  Use salt-free seasonings or herbs instead of table salt or sea salt.  Check with your health care provider or pharmacist before using salt substitutes.  Eat lower-sodium products, often labeled as "lower sodium" or "no salt added."  Eat fresh foods.  Eat more vegetables, fruits, and low-fat dairy products.  Choose whole grains. Look for the word "whole" as the first word in the ingredient list.  Choose fish and skinless chicken or [place] more often than red meat. Limit fish, poultry, and meat to 6 oz (170 g) each day.  Limit sweets, desserts, sugars, and sugary drinks.  Choose heart-healthy fats.  Limit cheese to 1 oz (28 g) per day.  Eat more home-cooked food and less restaurant, buffet, and fast food.  Limit fried foods.  Cook foods using methods other  than frying.  Limit canned vegetables. If you do use them, rinse them well to decrease the sodium.  When eating at a restaurant, ask that your food be prepared with less salt, or no salt if possible. WHAT FOODS CAN I EAT? Seek help from a dietitian for individual calorie needs. Grains Whole grain or whole wheat bread. Brown rice. Whole grain or whole wheat pasta. Quinoa, bulgur, and whole grain cereals. Low-sodium cereals. Corn or whole wheat flour tortillas. Whole grain cornbread. Whole grain crackers. Low-sodium crackers. Vegetables Fresh or frozen vegetables (raw, steamed, roasted, or grilled). Low-sodium or reduced-sodium tomato and vegetable juices. Low-sodium or reduced-sodium tomato sauce and paste. Low-sodium or reduced-sodium canned vegetables.  Fruits All fresh, canned (in natural juice), or frozen fruits. Meat and Other Protein Products Ground beef (85% or leaner), grass-fed beef, or beef trimmed of fat. Skinless chicken or [place]. Ground chicken or [place]. Pork trimmed of fat. All fish and seafood. Eggs. Dried beans, peas, or lentils. Unsalted nuts and seeds. Unsalted canned beans. Dairy Low-fat dairy products, such as skim or 1% milk, 2% or reduced-fat cheeses, low-fat ricotta or cottage cheese, or plain low-fat yogurt. Low-sodium or reduced-sodium cheeses. Fats and Oils Tub margarines without trans fats. Light or reduced-fat mayonnaise and salad dressings (reduced sodium). Avocado. Safflower, olive, or canola oils. Natural peanut or almond butter. Other Unsalted popcorn and pretzels. The items listed above may not be a complete list of recommended foods or beverages. Contact your dietitian for more options. WHAT FOODS ARE  NOT RECOMMENDED? Grains White bread. White pasta. White rice. Refined cornbread. Bagels and croissants. Crackers that contain trans fat. Vegetables Creamed or fried vegetables. Vegetables in a cheese sauce. Regular canned vegetables. Regular canned tomato  sauce and paste. Regular tomato and vegetable juices. Fruits Dried fruits. Canned fruit in light or heavy syrup. Fruit juice. Meat and Other Protein Products Fatty cuts of meat. Ribs, chicken wings, bacon, sausage, bologna, salami, chitterlings, fatback, hot dogs, bratwurst, and packaged luncheon meats. Salted nuts and seeds. Canned beans with salt. Dairy Whole or 2% milk, cream, half-and-half, and cream cheese. Whole-fat or sweetened yogurt. Full-fat cheeses or blue cheese. Nondairy creamers and whipped toppings. Processed cheese, cheese spreads, or cheese curds. Condiments Onion and garlic salt, seasoned salt, table salt, and sea salt. Canned and packaged gravies. Worcestershire sauce. Tartar sauce. Barbecue sauce. Teriyaki sauce. Soy sauce, including reduced sodium. Steak sauce. Fish sauce. Oyster sauce. Cocktail sauce. Horseradish. Ketchup and mustard. Meat flavorings and tenderizers. Bouillon cubes. Hot sauce. Tabasco sauce. Marinades. Taco seasonings. Relishes. Fats and Oils Butter, stick margarine, lard, shortening, ghee, and bacon fat. Coconut, palm kernel, or palm oils. Regular salad dressings. Other Pickles and olives. Salted popcorn and pretzels. The items listed above may not be a complete list of foods and beverages to avoid. Contact your dietitian for more information. WHERE CAN I FIND MORE INFORMATION? National Heart, Lung, and Blood Institute: travelstabloid.com Document Released: 09/28/2011 Document Revised: 02/23/2014 Document Reviewed: 08/13/2013 [facility] Patient Information 2015 [place], [place]. This information is not intended to replace advice given to you by your health care provider. Make sure you discuss any questions you have with your health care provider.      Bad carbs also include fruit juice, alcohol, and sweet tea. These are empty calories that do not signal to your brain that you are full.   Please remember the good carbs are  still carbs which convert into sugar. So please measure them out no more than 1/2-1 cup of rice, oatmeal, pasta, and beans  Veggies are however free foods! Pile them on.   Not all fruit is created equal. Please see the list below, the fruit at the bottom is higher in sugars than the fruit at the top. Please avoid all dried fruits.     If you have a smart phone, please look up Smoke Free app, this will help you stay on track and give you information about money you have saved, life that you have gained back and a ton of more information.   We are giving you chantix for smoking cessation. You can do it! And we are here to help! You may have heard some scary side effects about chantix, the three most common I hear about are nausea, crazy dreams and depression.  However, I like for my patients to try to stay on 1/2 a tablet twice a day rather than one tablet twice a day as normally prescribed. This helps decrease the chances of side effects and helps save money by making a one month prescription last two months  Please start the prescription this way:  Start 1/2 tablet by mouth once daily after food with a full glass of water for 3 days Then do 1/2 tablet by mouth twice daily for 4 days. During this first week you can smoke, but try to stop after this week.  At this point we have several options: 1) continue on 1/2 tablet twice a day- which I encourage you to do. You can stay on this  dose the rest of the time on the medication or if you still feel the need to smoke you can do one of the two options below. 2) do one tablet in the morning and 1/2 in the evening which helps decrease dreams. 3) do one tablet twice a day.   What if I miss a dose? If you miss a dose, take it as soon as you can. If it is almost time for your next dose, take only that dose. Do not take double or extra doses.  What should I watch for while using this medicine? Visit your doctor or health care professional for regular  check ups. Ask for ongoing advice and encouragement from your doctor or healthcare professional, friends, and family to help you quit. If you smoke while on this medication, quit again  Your mouth may get dry. Chewing sugarless gum or hard candy, and drinking plenty of water may help. Contact your doctor if the problem does not go away or is severe.  You may get drowsy or dizzy. Do not drive, use machinery, or do anything that needs mental alertness until you know how this medicine affects you. Do not stand or sit up quickly, especially if you are an older patient.   The use of this medicine may increase the chance of suicidal thoughts or actions. Pay special attention to how you are responding while on this medicine. Any worsening of mood, or thoughts of suicide or dying should be reported to your health care professional right away.  ADVANTAGES OF QUITTING SMOKING  Within 20 minutes, blood pressure decreases. Your pulse is at normal level.  After 8 hours, carbon monoxide levels in the blood return to normal. Your oxygen level increases.  After 24 hours, the chance of having a heart attack starts to decrease. Your breath, hair, and body stop smelling like smoke.  After 48 hours, damaged nerve endings begin to recover. Your sense of taste and smell improve.  After 72 hours, the body is virtually free of nicotine. Your bronchial tubes relax and breathing becomes easier.  After 2 to 12 weeks, lungs can hold more air. Exercise becomes easier and circulation improves.  After 1 year, the risk of coronary heart disease is cut in half.  After 5 years, the risk of stroke falls to the same as a nonsmoker.  After 10 years, the risk of lung cancer is cut in half and the risk of other cancers decreases significantly.  After 15 years, the risk of coronary heart disease drops, usually to the level of a nonsmoker.  You will have extra money to spend on things other than cigarettes.

## 2017-08-20 NOTE — Progress Notes (Signed)
Assessment and Plan:  Essential hypertension - continue medications, DASH diet, exercise and monitor at home. Call if greater than 130/80.  - CBC with Differential/Platelet - BASIC METABOLIC PANEL WITH GFR - Hepatic function panel - TSH  Diabetes mellitus without complication ([place]) Discussed general issues about diabetes pathophysiology and management., Educational material distributed., Suggested low cholesterol diet., Encouraged aerobic exercise., Discussed foot care., Reminded to get yearly retinal exam. - discussed hypoglycemia, start to check sugars, take 1/2 amaryl pending sugar - Hemoglobin A1c   Hyperlipidemia -continue medications, check lipids, decrease fatty foods, increase activity.  - Lipid panel  Morbid obesity, unspecified obesity type ([place]) Obesity with co morbidities- long discussion about weight loss, diet, and exercise - Lipid panel - Hemoglobin A1c   Vitamin D deficiency - VITAMIN D 25 Hydroxy (Vit-D Deficiency, Fractures)  Medication management - Magnesium  Chronic obstructive pulmonary disease, unspecified COPD type (HCC) Try anoro due to yeast, start on wellbutrin after the holidays, stop smoking  Hypogonadism - continue to monitor, weight loss advised  Continue diet and meds as discussed. Further disposition pending results of labs. Discussed med's effects and SE's.    HPI 61 y.o. male  presents for 3 month follow up with hypertension, hyperlipidemia, diabetes and vitamin D. His blood pressure has not been controlled at home he does not check it at home,  today their BP is BP: 140/82  He does not workout. He denies chest pain, shortness of breath, dizziness. He has COPD, he continues to smoke, will try to quit after the holidays by getting back on wellbutrin, he is on Breo but having yeast problems. 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  He is on cholesterol medication and denies myalgias. His cholesterol is not at goal. The cholesterol  last visit was:   Lab Results  Component Value Date   CHOL 173 03/12/2017   HDL 31 (L) 03/12/2017   LDLCALC 120 (H) 03/12/2017   TRIG 110 03/12/2017   CHOLHDL 5.6 (H) 03/12/2017   He has been working on diet and exercise for Diabetes with CKD,  he is on 1 MF a day and he takes glimepiride morning and night, occ will not take night time dose, does not check sugars, and denies polydipsia, polyuria and visual disturbances. Last A1C in the office was:  Lab Results  Component Value Date   HGBA1C 7.6 (H) 03/12/2017   Lab Results  Component Value Date   GFRNONAA 80 03/12/2017   Patient is on Vitamin D supplement, 10,000 daily. Lab Results  Component Value Date   VD25OH 30 07/03/2016     BMI is Body mass index is 37.12 kg/m., he is working on diet and exercise.  Wt Readings from Last 3 Encounters:  08/20/17 255 lb (115.7 kg)  03/12/17 257 lb 9.6 oz (116.8 kg)  12/04/16 264 lb (119.7 kg)     Current Medications:  Current Outpatient Prescriptions on File Prior to Visit  Medication Sig Dispense Refill  . albuterol (PROAIR HFA) 108 (90 Base) MCG/ACT inhaler 1-2 inhalations 5-10 minutes apart 4 x / day or every 4 hours to rescue Asthma 3 Inhaler 3  . BREO ELLIPTA 100-25 MCG/INH AEPB INHALE 1 PUFF INTO THE LUNGS DAILY. RINSE MOUTH WITH WATER AFTER EACH USE 60 each 3  . buPROPion (WELLBUTRIN XL) 150 MG 24 hr tablet TAKE 1 TABLET EVERY MORNING 90 tablet 1  . Cholecalciferol (VITAMIN D PO) Take 5,000 Int'l Units by mouth daily.    Marland Kitchen  CIALIS 20 MG tablet TAKE 1 TABLET BY MOUTH DAILY AS NEEDED 6 tablet 3  . fluticasone (FLONASE) 50 MCG/ACT nasal spray USE 1 SPRAY(S) INTRANASALLY ONCE A DAY  0  . glimepiride (AMARYL) 4 MG tablet TAKE 1/2 TO 1 TABLET BY MOUTH TWICE DAILY FOR DIABETES 180 tablet 1  . glucose blood test strip 1 each by Other route as needed for other. Test Blood Sugar Once daily 100 each PRN  . losartan (COZAAR) 50 MG tablet TAKE 1 TABLET BY MOUTH EVERY DAY 90 tablet 1  .  lovastatin (MEVACOR) 20 MG tablet Take 1 tablet (20 mg total) by mouth at bedtime. 90 tablet 1  . metFORMIN (GLUCOPHAGE-XR) 500 MG 24 hr tablet TAKE 2 TABLETS BY MOUTH TWICE A DAY WITH A MEAL 360 tablet 1  . nystatin (MYCOSTATIN) 100000 UNIT/ML suspension Take 5 mLs (500,000 Units total) by mouth 4 (four) times daily. Swish and swallow 60 mL 2   No current facility-administered medications on file prior to visit.    Medical History:  Past Medical History:  Diagnosis Date  . Asthma   . Erectile dysfunction   . Hyperlipidemia   . Hypertension   . Other testicular hypofunction   . Type II or unspecified type diabetes mellitus without mention of complication, not stated as uncontrolled    Allergies:  Allergies  Allergen Reactions  . Flax [Bio-Flax]     Review of Systems  Constitutional: Negative.   HENT: Negative.   Eyes: Negative.   Respiratory: Negative.   Cardiovascular: Negative.   Gastrointestinal: Negative.   Genitourinary: Negative.   Musculoskeletal: Positive for back pain. Negative for falls, joint pain, myalgias and neck pain.  Skin: Negative.   Neurological: Negative.   Endo/Heme/Allergies: Negative.   Psychiatric/Behavioral: Negative.      Family history- Review and unchanged Social history- Review and unchanged Physical Exam: BP 140/82   Pulse 84   Temp (!) 97.5 F (36.4 C)   Resp 18   Ht 5' 9.5" (1.765 m)   Wt 255 lb (115.7 kg)   SpO2 95%   BMI 37.12 kg/m  Wt Readings from Last 3 Encounters:  08/20/17 255 lb (115.7 kg)  03/12/17 257 lb 9.6 oz (116.8 kg)  12/04/16 264 lb (119.7 kg)   General Appearance: Well nourished, in no apparent distress. Eyes: PERRLA, EOMs, conjunctiva no swelling or erythema Sinuses: No Frontal/maxillary tenderness ENT/Mouth: Ext aud canals clear, TMs without erythema, bulging. No erythema, swelling, or exudate on post pharynx.  Tonsils not swollen or erythematous. Hearing normal.  Neck: Supple, thyroid normal.  Respiratory:  Respiratory effort normal, BS equal bilaterally without rales, rhonchi, wheezing or stridor.  Cardio: RRR with no MRGs. Brisk peripheral pulses without edema.  Abdomen: Soft, + BS.  Non tender, no guarding, rebound, hernias, masses. Lymphatics: Non tender without lymphadenopathy.  Musculoskeletal: Full ROM, 5/5 strength, normal gait.  Skin: Warm, dry without rashes, lesions, ecchymosis.  Neuro: Cranial nerves intact. No cerebellar symptoms. Sensation intact.  Psych: Awake and oriented X 3, normal affect, Insight and Judgment appropriate.    Vicie Mutters 10:08 AM

## 2017-08-21 LAB — HEPATIC FUNCTION PANEL
AG RATIO: 1.3 (calc) (ref 1.0–2.5)
ALKALINE PHOSPHATASE (APISO): 73 U/L (ref 40–115)
ALT: 15 U/L (ref 9–46)
AST: 12 U/L (ref 10–35)
Albumin: 4.1 g/dL (ref 3.6–5.1)
BILIRUBIN DIRECT: 0.1 mg/dL (ref 0.0–0.2)
BILIRUBIN TOTAL: 0.4 mg/dL (ref 0.2–1.2)
Globulin: 3.1 g/dL (calc) (ref 1.9–3.7)
Indirect Bilirubin: 0.3 mg/dL (calc) (ref 0.2–1.2)
Total Protein: 7.2 g/dL (ref 6.1–8.1)

## 2017-08-21 LAB — BASIC METABOLIC PANEL WITH GFR
BUN: 15 mg/dL (ref 7–25)
CO2: 29 mmol/L (ref 20–32)
Calcium: 9.4 mg/dL (ref 8.6–10.3)
Chloride: 103 mmol/L (ref 98–110)
Creat: 1.09 mg/dL (ref 0.70–1.33)
GFR, EST AFRICAN AMERICAN: 86 mL/min/{1.73_m2} (ref >=60)
GFR, EST NON AFRICAN AMERICAN: 74 mL/min/{1.73_m2} (ref >=60)
Glucose, Bld: 231 mg/dL — ABNORMAL HIGH (ref 65–99)
POTASSIUM: 5 mmol/L (ref 3.5–5.3)
SODIUM: 139 mmol/L (ref 135–146)

## 2017-08-21 LAB — TSH: TSH: 2.1 mIU/L (ref 0.40–4.50)

## 2017-08-21 LAB — CBC WITH DIFFERENTIAL/PLATELET
BASOS ABS: 28 {cells}/uL (ref 0–200)
Basophils Relative: 0.3 %
EOS ABS: 84 {cells}/uL (ref 15–500)
Eosinophils Relative: 0.9 %
HCT: 44.9 % (ref 38.5–50.0)
HEMOGLOBIN: 15 g/dL (ref 13.2–17.1)
Lymphs Abs: 2585 cells/uL (ref 850–3900)
MCH: 28.6 pg (ref 27.0–33.0)
MCHC: 33.4 g/dL (ref 32.0–36.0)
MCV: 85.5 fL (ref 80.0–100.0)
MPV: 10.1 fL (ref 7.5–12.5)
Monocytes Relative: 7.2 %
NEUTROS ABS: 5933 {cells}/uL (ref 1500–7800)
Neutrophils Relative %: 63.8 %
Platelets: 208 10*3/uL (ref 140–400)
RBC: 5.25 10*6/uL (ref 4.20–5.80)
RDW: 12.7 % (ref 11.0–15.0)
Total Lymphocyte: 27.8 %
WBC: 9.3 10*3/uL (ref 3.8–10.8)
WBCMIX: 670 {cells}/uL (ref 200–950)

## 2017-08-21 LAB — HEMOGLOBIN A1C
HEMOGLOBIN A1C: 8.3 %{Hb} — AB (ref <5.7)
Mean Plasma Glucose: 192 (calc)
eAG (mmol/L): 10.6 (calc)

## 2017-08-21 LAB — LIPID PANEL
CHOL/HDL RATIO: 4.8 (calc) (ref <5.0)
Cholesterol: 172 mg/dL (ref <200)
HDL: 36 mg/dL — ABNORMAL LOW (ref >40)
LDL Cholesterol (Calc): 117 mg/dL (calc) — ABNORMAL HIGH
Non-HDL Cholesterol (Calc): 136 mg/dL (calc) — ABNORMAL HIGH (ref <130)
Triglycerides: 90 mg/dL (ref <150)

## 2017-08-22 NOTE — Progress Notes (Signed)
Pt aware of lab results via voice message per pt request due to working 3rd shift & results with a letter to explain them were mailed out to pt.

## 2017-09-05 DIAGNOSIS — E089 Diabetes mellitus due to underlying condition without complications: Secondary | ICD-10-CM | POA: Diagnosis not present

## 2017-09-05 DIAGNOSIS — E119 Type 2 diabetes mellitus without complications: Secondary | ICD-10-CM | POA: Diagnosis not present

## 2017-10-08 ENCOUNTER — Encounter: Payer: Self-pay | Admitting: Physician Assistant

## 2017-10-14 LAB — HM DIABETES EYE EXAM

## 2017-11-01 ENCOUNTER — Encounter: Payer: Self-pay | Admitting: *Deleted

## 2017-11-19 NOTE — Progress Notes (Signed)
Complete Physical  Assessment and Plan:  Essential hypertension - continue medications, DASH diet, exercise and monitor at home. Call if greater than 130/80.  - start to eat better, if still above 140/90 can increase cozaar to 100mg  a day (2 a day) -     CBC with Differential/Platelet -     BASIC METABOLIC PANEL WITH GFR -     Hepatic function panel -     TSH -     Urinalysis, Routine w reflex microscopic -     Microalbumin / creatinine urine ratio -     EKG 12-Lead -     [place], RETROPERITNL ABD,  LTD  Chronic obstructive pulmonary disease, unspecified COPD type ([place]) Advised to stop smoking, CXR, continue meds/Breo.   Diabetes mellitus with CKD ([place]) Discussed general issues about diabetes pathophysiology and management., Educational material distributed., Suggested low cholesterol diet., Encouraged aerobic exercise., Discussed foot care., Reminded to get yearly retinal exam. -     Hemoglobin A1c PATIENT DOES NOT WANT MEDICATIONS SWITCHED AT THIS TIME, VERY MOTIVATED TO MAKE HEALTHIER CHOICES  Hyperlipidemia, unspecified hyperlipidemia type -continue medications, check lipids, decrease fatty foods, increase activity. -     lovastatin (MEVACOR) 20 MG tablet; Take 1 tablet (20 mg total) by mouth at bedtime. -     Lipid panel  Morbid obesity (HCC) - follow up 4 months for progress monitoring - increase veggies, decrease carbs - long discussion about weight loss, diet, and exercise  Erectile dysfunction associated with type 2 diabetes mellitus ([place]) Discussed general issues about diabetes pathophysiology and management., Educational material distributed., Suggested low cholesterol diet., Encouraged aerobic exercise., Discussed foot care., Reminded to get yearly retinal exam.  Hypogonadism in male May be interested in the shots, get on zinc, weight loss advised -     Testosterone  Uncomplicated asthma, unspecified asthma severity, unspecified whether persistent Continue breo,  weight loss advised  Tobacco use disorder -     [place], RETROPERITNL ABD,  LTD Smoking cessation-  instruction/counseling given, counseled patient on the dangers of tobacco use, advised patient to stop smoking, and reviewed strategies to maximize success, patient not ready to quit at this time.   Medication management -     Magnesium  Vitamin D deficiency -     VITAMIN D 25 Hydroxy (Vit-D Deficiency, Fractures)  BMI 38.0-38.9,adult Discussed diet/exercise, weight management and risk modification  Screening PSA (prostate specific antigen) -     PSA  CPE Will check into cologuard, VERY long discussion about getting colonoscopy.  Discussed that Colon cancer is 3rd most diagnosed cancer and 2nd leading cause of death in both men and women 7 years of age and older despite being one of the most preventable and treatable cancers if found early. Despite this information patient still declines colonoscopy and understands risk of cancer and death.   Discussed med's effects and SE's. Screening labs and tests as requested with regular follow-up as recommended. Future Appointments  Date Time Provider [place]  12/10/2018  3:00 PM Vicie Mutters, PA-C [id number] None    HPI 62 y.o. male  presents for a complete physical. His blood pressure has been controlled at home, today their BP is BP: (!) 146/80 He does not workout. He denies chest pain, shortness of breath, dizziness.  He has asthma/COPD, is on breo, did not do well with anoro, and will occ use albuterol, has been more recently.  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  He has started days at work, having a hard time adjusting with eating for that.  He is on cholesterol medication, lovastatin 20mg  but admits to not taking it regularly and denies myalgias. His cholesterol is not at goal, less than 70. The cholesterol last visit was:   Lab Results  Component  Value Date   CHOL 172 08/20/2017   HDL 36 (L) 08/20/2017   LDLCALC 120 (H) 03/12/2017   TRIG 90 08/20/2017   CHOLHDL 4.8 08/20/2017   He has not been working on diet and exercise for diabetes, and denies foot ulcerations, hypoglycemia , paresthesia of the feet, polydipsia, polyuria and visual disturbances. He is on ACE and on bASA and metformin once a day and glimepiride, he is on 2mg  BID, has not been checking sugars. Tried invokana but could not tolerate it.   Last A1C in the office was:  Lab Results  Component Value Date   HGBA1C 8.3 (H) 08/20/2017   Lab Results  Component Value Date   GFRNONAA 74 08/20/2017   Patient is on Vitamin D supplement.   Lab Results  Component Value Date   VD25OH 30 07/03/2016   BMI is Body mass index is 38.22 kg/m., he is working on diet and exercise. Due to his obesity/low testosterone he has ED and uses cialis PRN.  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  Wt Readings from Last 3 Encounters:  11/20/17 258 lb 12.8 oz (117.4 kg)  08/20/17 255 lb (115.7 kg)  03/12/17 257 lb 9.6 oz (116.8 kg)   Lab Results  Component Value Date   PSA 0.23 03/13/2016   PSA 0.22 01/04/2015   PSA 0.29 12/29/2013    Current Medications:  Current Outpatient Medications on File Prior to Visit  Medication Sig Dispense Refill  . albuterol (PROAIR HFA) 108 (90 Base) MCG/ACT inhaler 1-2 inhalations 5-10 minutes apart 4 x / day or every 4 hours to rescue Asthma 3 Inhaler 3  . buPROPion (WELLBUTRIN XL) 150 MG 24 hr tablet TAKE 1 TABLET EVERY MORNING 90 tablet 1  . Cholecalciferol (VITAMIN D PO) Take 5,000 Int'l Units by mouth daily.    . fluticasone (FLONASE) 50 MCG/ACT nasal spray USE 1 SPRAY(S) INTRANASALLY ONCE A DAY  0  . glimepiride (AMARYL) 4 MG tablet TAKE 1/2 TO 1 TABLET BY MOUTH TWICE DAILY FOR DIABETES 180 tablet 1  . glucose blood test strip 1 each by Other route as needed for other. Test Blood Sugar  Once daily 100 each PRN  . losartan (COZAAR) 50 MG tablet TAKE 1 TABLET BY MOUTH EVERY DAY 90 tablet 1  . metFORMIN (GLUCOPHAGE-XR) 500 MG 24 hr tablet TAKE 2 TABLETS BY MOUTH TWICE A DAY WITH A MEAL 360 tablet 1  . nystatin (MYCOSTATIN) 100000 UNIT/ML suspension Take 5 mLs (500,000 Units total) by mouth 4 (four) times daily. Swish and swallow 60 mL 2  . tadalafil (CIALIS) 20 MG tablet Take 1 tablet (20 mg total) by mouth daily as needed. 6 tablet 3  . umeclidinium-vilanterol (ANORO ELLIPTA) 62.5-25 MCG/INH AEPB Inhale 1 puff into the lungs daily. Make sure you rinse your mouth after each use. 1 each 4   No current facility-administered medications on file prior to visit.    Immunization History  Administered Date(s) Administered  . Influenza-Unspecified 08/15/2017  . Tdap 01/04/2015   Health Maintenance:  Tetanus: 2016  Pneumovax: declines Prevnar 13: when 67 Flu vaccine: 07/2013 Zostavax: declines DEXA:N/A Colonoscopy: declines- long discussion, understands risk will call about cologuard EGD: N/A  Eye Doctor: 10/14/2017 Dr. Kerin Ransom Dentist:None  Medical History:  Past Medical History:  Diagnosis Date  . Asthma   . Erectile dysfunction   . Hyperlipidemia   . Hypertension   . Other testicular hypofunction   . Type II or unspecified type diabetes mellitus without mention of complication, not stated as uncontrolled    Allergies Allergies  Allergen Reactions  . Flax [Bio-Flax]     SURGICAL HISTORY He  has no past surgical history on file. FAMILY HISTORY His family history includes Cancer in his father; Diabetes in his mother; Hypertension in his mother; Stroke in his mother. SOCIAL HISTORY He  reports that he has been smoking.  he has never used smokeless tobacco. He reports that he does not drink alcohol or use drugs. 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   Review of Systems  Constitutional: Negative.   HENT: Negative.   Eyes: Negative.   Respiratory: Negative.    Cardiovascular: Negative.   Gastrointestinal: Negative.   Genitourinary: Negative.   Musculoskeletal: Negative for back pain, falls, joint pain, myalgias and neck pain.  Skin: Negative.   Neurological: Negative.   Endo/Heme/Allergies: Negative.   Psychiatric/Behavioral: Negative for depression, hallucinations, memory loss, substance abuse and suicidal ideas. The patient is not nervous/anxious and does not have insomnia.      Physical Exam: Estimated body mass index is 38.22 kg/m as calculated from the following:   Height as of this encounter: 5\' 9"  (1.753 m).   Weight as of this encounter: 258 lb 12.8 oz (117.4 kg). Vitals:   11/20/17 1541  BP: (!) 146/80  Pulse: 86  Resp: 16  Temp: (!) 97.5 F (36.4 C)  SpO2: 96%   General Appearance: Well nourished, in no apparent distress. Eyes: PERRLA, EOMs, conjunctiva no swelling or erythema, normal fundi and vessels. Sinuses: No Frontal/maxillary tenderness ENT/Mouth: Ext aud canals clear, normal light reflex with TMs without erythema, bulging. Good dentition. No erythema, swelling, or exudate on post pharynx. Tonsils not swollen or erythematous. Hearing normal.  Neck: Supple, thyroid normal. No bruits Respiratory: Respiratory effort normal, BS equal bilaterally with mild expiratory wheeze without rales, rhonchi, or stridor. Cardio: RRR without murmurs, rubs or gallops. Brisk peripheral pulses without edema.  Chest: symmetric, with normal excursions and percussion. Abdomen: Soft, obese +BS. Non tender, no guarding, rebound, hernias, masses, or organomegaly. .  Lymphatics: Non tender without lymphadenopathy.  Genitourinary: defer Musculoskeletal: Full ROM all peripheral extremities,5/5 strength, and normal gait. Skin: Warm, dry without rashes, lesions, ecchymosis.  Neuro: Cranial nerves intact, reflexes equal bilaterally. Normal muscle tone, no cerebellar symptoms. Sensation intact.  Psych: Awake and oriented X 3, normal affect,  Insight and Judgment appropriate.   EKG: WNL no changes. Aorta WNL    Vicie Mutters 4:14 PM

## 2017-11-20 ENCOUNTER — Encounter: Payer: Self-pay | Admitting: Physician Assistant

## 2017-11-20 ENCOUNTER — Ambulatory Visit (INDEPENDENT_AMBULATORY_CARE_PROVIDER_SITE_OTHER): Payer: 59 | Admitting: Physician Assistant

## 2017-11-20 VITALS — BP 146/80 | HR 86 | Temp 97.5°F | Resp 16 | Ht 69.0 in | Wt 258.8 lb

## 2017-11-20 DIAGNOSIS — F17200 Nicotine dependence, unspecified, uncomplicated: Secondary | ICD-10-CM

## 2017-11-20 DIAGNOSIS — I10 Essential (primary) hypertension: Secondary | ICD-10-CM

## 2017-11-20 DIAGNOSIS — Z0000 Encounter for general adult medical examination without abnormal findings: Secondary | ICD-10-CM

## 2017-11-20 DIAGNOSIS — E1122 Type 2 diabetes mellitus with diabetic chronic kidney disease: Secondary | ICD-10-CM

## 2017-11-20 DIAGNOSIS — N182 Chronic kidney disease, stage 2 (mild): Secondary | ICD-10-CM

## 2017-11-20 DIAGNOSIS — E785 Hyperlipidemia, unspecified: Secondary | ICD-10-CM | POA: Diagnosis not present

## 2017-11-20 DIAGNOSIS — Z6838 Body mass index (BMI) 38.0-38.9, adult: Secondary | ICD-10-CM

## 2017-11-20 DIAGNOSIS — Z79899 Other long term (current) drug therapy: Secondary | ICD-10-CM

## 2017-11-20 DIAGNOSIS — E291 Testicular hypofunction: Secondary | ICD-10-CM

## 2017-11-20 DIAGNOSIS — N521 Erectile dysfunction due to diseases classified elsewhere: Secondary | ICD-10-CM

## 2017-11-20 DIAGNOSIS — J45909 Unspecified asthma, uncomplicated: Secondary | ICD-10-CM

## 2017-11-20 DIAGNOSIS — E119 Type 2 diabetes mellitus without complications: Secondary | ICD-10-CM | POA: Diagnosis not present

## 2017-11-20 DIAGNOSIS — E559 Vitamin D deficiency, unspecified: Secondary | ICD-10-CM

## 2017-11-20 DIAGNOSIS — Z125 Encounter for screening for malignant neoplasm of prostate: Secondary | ICD-10-CM | POA: Diagnosis not present

## 2017-11-20 DIAGNOSIS — E1169 Type 2 diabetes mellitus with other specified complication: Secondary | ICD-10-CM

## 2017-11-20 DIAGNOSIS — E782 Mixed hyperlipidemia: Secondary | ICD-10-CM

## 2017-11-20 DIAGNOSIS — J449 Chronic obstructive pulmonary disease, unspecified: Secondary | ICD-10-CM

## 2017-11-20 MED ORDER — LOVASTATIN 20 MG PO TABS: 20.0000 mg | ORAL_TABLET | Freq: Every day | ORAL | 1 refills | Status: DC

## 2017-11-20 NOTE — Patient Instructions (Addendum)
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   Cologuard is an easy to use noninvasive colon cancer screening test based on the latest advances in stool DNA science.   Colon cancer is 3rd most diagnosed cancer and 2nd leading cause of death in both men and women 56 years of age and older despite being one of the most preventable and treatable cancers if found early.  4 of out 5 people diagnosed with colon cancer have NO prior family history.  When caught EARLY 90% of colon cancer is curable.   You have agreed to do a Cologuard screening and have declined a colonoscopy in spite of being explained the risks and benefits of the colonoscopy in detail, including cancer and death. Please understand that this is test not as sensitive or specific as a colonoscopy and you are still recommended to get a colonoscopy.   If you are NOT medicare please call your insurance company and give them these items to see if they will cover it: 1) CPT code, [phone] 2) Provider is Probation officer 3) [employer] NPI [phone] 4) [place] Tax ID [phone]  Out-of-pocket cost for Cologuard can range from $0 - $649 so please call  You will receive a short call from [place] support center at [employer], when you receive a call they will say they are from [place],  to confirm your mailing address and give you more information.  When they calll you, it will appear on the caller ID as "[employer]" or in some cases only this number will appear, [phone].   [facility] [employer] will ship your collection kit directly to you. You will collect a single stool sample in the privacy of your own home, no special preparation required. You will return the kit via [place]  pre-paid shipping or pick-up, in the same box it arrived in. Then I will contact you to discuss your results after I receive them from the laboratory.   If you have any questions or concerns, Cologuard Customer Support Specialist are available 24 hours a day, 7 days a week at [phone] or go to [email or web address].    We are starting you on Metformin to prevent or treat diabetes.  Metformin does NOT cause low blood sugars.   In order to create energy your cells need insulin and sugar but sometime your cells do not accept the insulin and this can cause increased sugars and decreased energy.   The Metformin helps your cells accept insulin and the sugar this help: 1) increase your energy  2) weight loss.    The two most common side effects are nausea and diarrhea, follow these rules to avoid it but these symptoms get better with time on the medication.    ALSO You can take imodium per box instructions when starting metformin if needed.   Rules of metformin: 1) start out slow with only one pill daily. Our goal for you is 4 pills a day or 209m total.  2) take with your largest meal. 3) Take with least amount of carbs.   Call if you have any problems.   Please pick one of the over the counter allergy medications below and take it once daily for  allergies.  Claritin or loratadine cheapest but likely the weakest  Zyrtec or certizine at night because it can make you sleepy The strongest is allegra or fexafinadine  Cheapest at [employer], [employer], [employer]  Can do zinc 40-50 mg a day Can try shake that is whey protein, almond milk, avocado oil, and spinach and strawberries in the morning  9 Ways to Naturally Increase Testosterone Levels  1.   Lose Weight If you're overweight, shedding the excess pounds may increase your testosterone levels, according to research presented at the Endocrine Society's 2012 meeting. Overweight men are more likely to have low testosterone levels to begin  with, so this is an important trick to increase your body's testosterone production when you need it most.  2.   High-Intensity Exercise like Peak Fitness  Short intense exercise has a proven positive effect on increasing testosterone levels and preventing its decline. That's unlike aerobics or prolonged moderate exercise, which have shown to have negative or no effect on testosterone levels. Having a whey protein meal after exercise can further enhance the satiety/testosterone-boosting impact (hunger hormones cause the opposite effect on your testosterone and libido). Here's a summary of what a typical high-intensity Peak Fitness routine might look like: " Warm up for three minutes  " Exercise as hard and fast as you can for 30 seconds. You should feel like you couldn't possibly go on another few seconds  " Recover at a slow to moderate pace for 90 seconds  " Repeat the high intensity exercise and recovery 7 more times .  3.   Consume Plenty of Zinc The mineral zinc is important for testosterone production, and supplementing your diet for as little as six weeks has been shown to cause a marked improvement in testosterone among men with low levels.1 Likewise, research has shown that restricting dietary sources of zinc leads to a significant decrease in testosterone, while zinc supplementation increases it2 -- and even protects men from exercised-induced reductions in testosterone levels.3 It's estimated that up to 67 percent of adults over the age of 60 may have lower than recommended zinc intakes; even when dietary supplements were added in, an estimated 20-25 percent of older adults still had inadequate zinc intakes, according to a [employer] and Nutrition Examination Survey.4 Your diet is the best source of zinc; along with protein-rich foods like meats and fish, other good dietary sources of zinc include raw milk, raw cheese, beans, and yogurt or kefir made from raw milk. It can be difficult to  obtain enough dietary zinc if you're a vegetarian, and also for meat-eaters as well, largely because of conventional farming methods that rely heavily on chemical fertilizers and pesticides. These chemicals deplete the soil of nutrients ... nutrients like zinc that must be absorbed by plants in order to be passed on to you. In many cases, you may further deplete the nutrients in your food by the way you prepare it. For most food, cooking it will drastically reduce its levels of nutrients like zinc ... particularly over-cooking, which many people do. If you decide to use a zinc supplement, stick to a dosage of less than 40 mg a day, as this is the recommended adult upper limit. Taking too much zinc can interfere with your body's ability to absorb other minerals, especially copper, and may cause nausea as a side effect.  4.   Strength Training In addition to Peak Fitness, strength training is also known to boost testosterone levels, provided you are doing so intensely  enough. When strength training to boost testosterone, you'll want to increase the weight and lower your number of reps, and then focus on exercises that work a large number of muscles, such as dead lifts or squats.  You can "turbo-charge" your weight training by going slower. By slowing down your movement, you're actually turning it into a high-intensity exercise. Super Slow movement allows your muscle, at the microscopic level, to access the maximum number of cross-bridges between the protein filaments that produce movement in the muscle.   5.   Optimize Your Vitamin D Levels Vitamin D, a steroid hormone, is essential for the healthy development of the nucleus of the sperm cell, and helps maintain semen quality and sperm count. Vitamin D also increases levels of testosterone, which may boost libido. In one study, overweight men who were given vitamin D supplements had a significant increase in testosterone levels after one year.5   6.    Reduce Stress When you're under a lot of stress, your body releases high levels of the stress hormone cortisol. This hormone actually blocks the effects of testosterone,6 presumably because, from a biological standpoint, testosterone-associated behaviors (mating, competing, aggression) may have lowered your chances of survival in an emergency (hence, the "fight or flight" response is dominant, courtesy of cortisol).  7.   Limit or Eliminate Sugar from Your Diet Testosterone levels decrease after you eat sugar, which is likely because the sugar leads to a high insulin level, another factor leading to low testosterone.7 Based on USDA estimates, the average American consumes 12 teaspoons of sugar a day, which equates to about TWO TONS of sugar during a lifetime.  8.   Eat Healthy Fats By healthy, this means not only mon- and polyunsaturated fats, like that found in avocadoes and nuts, but also saturated, as these are essential for building testosterone. Research shows that a diet with less than 40 percent of energy as fat (and that mainly from animal sources, i.e. saturated) lead to a decrease in testosterone levels.8 My personal diet is about 60-70 percent healthy fat, and other experts agree that the ideal diet includes somewhere between 50-70 percent fat.  It's important to understand that your body requires saturated fats from animal and vegetable sources (such as meat, dairy, certain oils, and tropical plants like coconut) for optimal functioning, and if you neglect this important food group in favor of sugar, grains and other starchy carbs, your health and weight are almost guaranteed to suffer. Examples of healthy fats you can eat more of to give your testosterone levels a boost include: Olives and Olive oil  Coconuts and coconut oil Butter made from raw grass-fed organic milk Raw nuts, such as, almonds or pecans Organic pastured egg yolks Avocados Grass-fed meats Palm oil Unheated organic nut  oils   9.   Boost Your Intake of Branch Chain Amino Acids (BCAA) from Foods Like [place] suggests that BCAAs result in higher testosterone levels, particularly when taken along with resistance training.9 While BCAAs are available in supplement form, you'll find the highest concentrations of BCAAs like leucine in dairy products - especially quality cheeses and whey protein. Even when getting leucine from your natural food supply, it's often wasted or used as a building block instead of an anabolic agent. So to create the correct anabolic environment, you need to boost leucine consumption way beyond mere maintenance levels. That said, keep in mind that using leucine as a free form amino acid can be highly counterproductive as when free  form amino acids are artificially administrated, they rapidly enter your circulation while disrupting insulin function, and impairing your body's glycemic control. Food-based leucine is really the ideal form that can benefit your muscles without side effects.

## 2017-11-21 LAB — CBC WITH DIFFERENTIAL/PLATELET
BASOS PCT: 0.4 %
Basophils Absolute: 40 cells/uL (ref 0–200)
EOS ABS: 130 {cells}/uL (ref 15–500)
Eosinophils Relative: 1.3 %
HCT: 45 % (ref 38.5–50.0)
HEMOGLOBIN: 15.3 g/dL (ref 13.2–17.1)
Lymphs Abs: 3190 cells/uL (ref 850–3900)
MCH: 28.2 pg (ref 27.0–33.0)
MCHC: 34 g/dL (ref 32.0–36.0)
MCV: 82.9 fL (ref 80.0–100.0)
MONOS PCT: 6.6 %
MPV: 9.6 fL (ref 7.5–12.5)
NEUTROS ABS: 5980 {cells}/uL (ref 1500–7800)
Neutrophils Relative %: 59.8 %
Platelets: 232 10*3/uL (ref 140–400)
RBC: 5.43 10*6/uL (ref 4.20–5.80)
RDW: 13 % (ref 11.0–15.0)
Total Lymphocyte: 31.9 %
WBC mixed population: 660 cells/uL (ref 200–950)
WBC: 10 10*3/uL (ref 3.8–10.8)

## 2017-11-21 LAB — URINALYSIS, ROUTINE W REFLEX MICROSCOPIC
BILIRUBIN URINE: NEGATIVE
Hgb urine dipstick: NEGATIVE
KETONES UR: NEGATIVE
Leukocytes, UA: NEGATIVE
Nitrite: NEGATIVE
Protein, ur: NEGATIVE
SPECIFIC GRAVITY, URINE: 1.02 (ref 1.001–1.03)
pH: 6 (ref 5.0–8.0)

## 2017-11-21 LAB — BASIC METABOLIC PANEL WITH GFR
BUN: 14 mg/dL (ref 7–25)
CO2: 28 mmol/L (ref 20–32)
CREATININE: 0.99 mg/dL (ref 0.70–1.33)
Calcium: 9.6 mg/dL (ref 8.6–10.3)
Chloride: 101 mmol/L (ref 98–110)
GFR, Est African American: 96 mL/min/{1.73_m2} (ref >=60)
GFR, Est Non African American: 83 mL/min/{1.73_m2} (ref >=60)
GLUCOSE: 131 mg/dL — AB (ref 65–99)
Potassium: 4.9 mmol/L (ref 3.5–5.3)
Sodium: 136 mmol/L (ref 135–146)

## 2017-11-21 LAB — HEPATIC FUNCTION PANEL
AG Ratio: 1.3 (calc) (ref 1.0–2.5)
ALKALINE PHOSPHATASE (APISO): 88 U/L (ref 40–115)
ALT: 13 U/L (ref 9–46)
AST: 11 U/L (ref 10–35)
Albumin: 4.1 g/dL (ref 3.6–5.1)
BILIRUBIN INDIRECT: 0.3 mg/dL (ref 0.2–1.2)
Bilirubin, Direct: 0.1 mg/dL (ref 0.0–0.2)
GLOBULIN: 3.1 g/dL (ref 1.9–3.7)
TOTAL PROTEIN: 7.2 g/dL (ref 6.1–8.1)
Total Bilirubin: 0.4 mg/dL (ref 0.2–1.2)

## 2017-11-21 LAB — HEMOGLOBIN A1C
EAG (MMOL/L): 12 (calc)
HEMOGLOBIN A1C: 9.2 %{Hb} — AB (ref <5.7)
MEAN PLASMA GLUCOSE: 217 (calc)

## 2017-11-21 LAB — LIPID PANEL
CHOLESTEROL: 179 mg/dL (ref <200)
HDL: 32 mg/dL — ABNORMAL LOW (ref >40)
LDL Cholesterol (Calc): 116 mg/dL (calc) — ABNORMAL HIGH
Non-HDL Cholesterol (Calc): 147 mg/dL (calc) — ABNORMAL HIGH (ref <130)
Total CHOL/HDL Ratio: 5.6 (calc) — ABNORMAL HIGH (ref <5.0)
Triglycerides: 195 mg/dL — ABNORMAL HIGH (ref <150)

## 2017-11-21 LAB — VITAMIN D 25 HYDROXY (VIT D DEFICIENCY, FRACTURES): VIT D 25 HYDROXY: 19 ng/mL — AB (ref 30–100)

## 2017-11-21 LAB — PSA: PSA: 0.3 ng/mL (ref <=4.0)

## 2017-11-21 LAB — MAGNESIUM: Magnesium: 1.8 mg/dL (ref 1.5–2.5)

## 2017-11-21 LAB — TESTOSTERONE: Testosterone: 288 ng/dL (ref 250–827)

## 2017-11-21 LAB — TSH: TSH: 2.18 mIU/L (ref 0.40–4.50)

## 2017-11-21 LAB — MICROALBUMIN / CREATININE URINE RATIO
CREATININE, URINE: 132 mg/dL (ref 20–320)
Microalb Creat Ratio: 7 mcg/mg creat (ref <30)
Microalb, Ur: 0.9 mg/dL

## 2017-12-04 ENCOUNTER — Other Ambulatory Visit: Payer: Self-pay | Admitting: Internal Medicine

## 2018-01-15 ENCOUNTER — Other Ambulatory Visit: Payer: Self-pay | Admitting: Internal Medicine

## 2018-01-15 DIAGNOSIS — I10 Essential (primary) hypertension: Secondary | ICD-10-CM

## 2018-02-12 ENCOUNTER — Other Ambulatory Visit: Payer: Self-pay | Admitting: Internal Medicine

## 2018-02-12 DIAGNOSIS — J4520 Mild intermittent asthma, uncomplicated: Secondary | ICD-10-CM

## 2018-02-18 ENCOUNTER — Other Ambulatory Visit: Payer: Self-pay | Admitting: Physician Assistant

## 2018-02-18 DIAGNOSIS — N521 Erectile dysfunction due to diseases classified elsewhere: Principal | ICD-10-CM

## 2018-02-18 DIAGNOSIS — E1169 Type 2 diabetes mellitus with other specified complication: Secondary | ICD-10-CM

## 2018-03-15 LAB — HM DIABETES EYE EXAM

## 2018-03-19 ENCOUNTER — Ambulatory Visit: Payer: Self-pay | Admitting: Adult Health

## 2018-03-25 ENCOUNTER — Encounter: Payer: Self-pay | Admitting: Physician Assistant

## 2018-04-09 ENCOUNTER — Other Ambulatory Visit: Payer: Self-pay | Admitting: Internal Medicine

## 2018-04-09 DIAGNOSIS — E119 Type 2 diabetes mellitus without complications: Secondary | ICD-10-CM | POA: Insufficient documentation

## 2018-04-09 DIAGNOSIS — I10 Essential (primary) hypertension: Secondary | ICD-10-CM

## 2018-04-09 DIAGNOSIS — E1122 Type 2 diabetes mellitus with diabetic chronic kidney disease: Secondary | ICD-10-CM | POA: Insufficient documentation

## 2018-04-09 NOTE — Progress Notes (Signed)
FOLLOW UP  Assessment and Plan:   Hypertension Well controlled with current medications  Monitor blood pressure at home; patient to call if consistently greater than 130/80 Continue DASH diet.   Reminder to go to the ER if any CP, SOB, nausea, dizziness, severe HA, changes vision/speech, left arm numbness and tingling and jaw pain.  Cholesterol Currently above goal; lovastatin 20 mg, declines dose increase at this visit, wants to work on lifestyle and re discuss at next visit Continue low cholesterol diet and exercise.  Check lipid panel.   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  Perform daily foot/skin check, notify office of any concerning changes.  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, will be working hard on lifestyle, repeat A1C in 1 month if not at goal Check A1C  Obesity with co morbidities Long discussion about weight loss, diet, and exercise Recommended diet heavy in fruits and veggies and low in animal meats, cheeses, and dairy products, appropriate calorie intake Discussed ideal weight for height Will follow up in 3 months  Vitamin D Def Below goal at last visit; not currently supplementing but does want to check levels today after long discussion of benefits Check Vit D level  COPD/asthma Doing well on BREO, not ready to quit smoking, discussed low dose CT at CPE  Continue diet and meds as discussed. Further disposition pending results of labs. Discussed med's effects and SE's.   Over 30 minutes of exam, counseling, chart review, and critical decision making was performed.   Future Appointments  Date Time Provider [place]  12/10/2018  3:00 PM Vicie Mutters, PA-C [id number] None    ----------------------------------------------------------------------------------------------------------------------  HPI 62 y.o. male   presents for 3 month follow up on hypertension, cholesterol, diabetes, morbid obesity, COPD/asthma and vitamin D deficiency.   he currently continues to smoke 2 pack a day; discussed risks associated with smoking, patient is not ready to quit.   BMI is Body mass index is 37.66 kg/m., he has been working on diet, but reports exercise is limited due to his work. Wt Readings from Last 3 Encounters:  04/10/18 255 lb (115.7 kg)  11/20/17 258 lb 12.8 oz (117.4 kg)  08/20/17 255 lb (115.7 kg)   His blood pressure has been controlled at home, today their BP is BP: 122/74  He does not workout. He denies chest pain, shortness of breath, dizziness.   He is on cholesterol medication (lovastatin 20 mg daily) and denies myalgias. His cholesterol is not at goal. The cholesterol last visit was:   Lab Results  Component Value Date   CHOL 179 11/20/2017   HDL 32 (L) 11/20/2017   LDLCALC 116 (H) 11/20/2017   TRIG 195 (H) 11/20/2017   CHOLHDL 5.6 (H) 11/20/2017    He has been working on diet and exercise for T2 diabetes, currently treated by metformin 500 mg once daily - can't tolerate higher dose, amaryl 4 mg BID, and denies foot ulcerations, increased appetite, nausea, paresthesia of the feet, polydipsia, polyuria, visual disturbances, vomiting and weight loss. He does not check sugars. Last A1C in the office was:  Lab Results  Component Value Date   HGBA1C 9.2 (H) 11/20/2017   Patient is not on Vitamin D supplement Lab Results  Component Value Date   VD25OH 19 (L) 11/20/2017        Current Medications:  Current Outpatient Medications on File Prior to Visit  Medication Sig  . albuterol (PROAIR HFA) 108 (90 Base) MCG/ACT inhaler 1-2 INHALATIONS 5-10 MINUTES APART 4 X / DAY OR EVERY 4 HOURS TO RESCUE ASTHMA  . BREO ELLIPTA 100-25 MCG/INH AEPB INHALE 1 PUFF INTO THE LUNGS DAILY. RINSE MOUTH WITH WATER AFTER EACH USE  . CIALIS 20 MG tablet TAKE 1 TABLET BY MOUTH EVERY DAY AS NEEDED  . fluticasone  (FLONASE) 50 MCG/ACT nasal spray USE 1 SPRAY(S) INTRANASALLY ONCE A DAY AS NEEDED  . glimepiride (AMARYL) 4 MG tablet TAKE 1/2 TO 1 TABLET BY MOUTH TWICE DAILY FOR DIABETES  . glucose blood test strip 1 each by Other route as needed for other. Test Blood Sugar Once daily  . losartan (COZAAR) 50 MG tablet TAKE 1 TABLET BY MOUTH EVERY DAY  . lovastatin (MEVACOR) 20 MG tablet Take 1 tablet (20 mg total) by mouth at bedtime.  . metFORMIN (GLUCOPHAGE-XR) 500 MG 24 hr tablet TAKE 2 TABLETS BY MOUTH TWICE A DAY WITH A MEAL  . Cholecalciferol (VITAMIN D PO) Take 5,000 Int'l Units by mouth daily.   No current facility-administered medications on file prior to visit.      Allergies:  Allergies  Allergen Reactions  . Flax [Bio-Flax]      Medical History:  Past Medical History:  Diagnosis Date  . Asthma   . Erectile dysfunction   . Hyperlipidemia   . Hypertension   . Other testicular hypofunction   . Type II or unspecified type diabetes mellitus without mention of complication, not stated as uncontrolled    Family history- Reviewed and unchanged Social history- Reviewed and unchanged   Review of Systems:  ROS    Physical Exam: BP 122/74   Pulse 70   Temp 97.7 F (36.5 C)   Ht 5\' 9"  (1.753 m)   Wt 255 lb (115.7 kg)   SpO2 97%   BMI 37.66 kg/m  Wt Readings from Last 3 Encounters:  04/10/18 255 lb (115.7 kg)  11/20/17 258 lb 12.8 oz (117.4 kg)  08/20/17 255 lb (115.7 kg)   General Appearance: Well nourished, in no apparent distress. Eyes: PERRLA, EOMs, conjunctiva no swelling or erythema Sinuses: No Frontal/maxillary tenderness ENT/Mouth: Ext aud canals clear, TMs without erythema, bulging. No erythema, swelling, or exudate on post pharynx.  Tonsils not swollen or erythematous. Hearing normal.  Neck: Supple, thyroid normal.  Respiratory: Respiratory effort normal, BS equal bilaterally without rales, rhonchi, wheezing or stridor.  Cardio: RRR with no MRGs. Brisk peripheral  pulses without edema.  Abdomen: Soft, + BS.  Non tender, no guarding, rebound, hernias, masses. Lymphatics: Non tender without lymphadenopathy.  Musculoskeletal: Full ROM, 5/5 strength, Normal gait Skin: Warm, dry without rashes, lesions, ecchymosis.  Neuro: Cranial nerves intact. No cerebellar symptoms.  Psych: Awake and oriented X 3, normal affect, Insight and Judgment appropriate.    Izora Ribas, NP 2:52 PM [facility]

## 2018-04-10 ENCOUNTER — Encounter: Payer: Self-pay | Admitting: Adult Health

## 2018-04-10 ENCOUNTER — Ambulatory Visit (INDEPENDENT_AMBULATORY_CARE_PROVIDER_SITE_OTHER): Payer: 59 | Admitting: Adult Health

## 2018-04-10 VITALS — BP 122/74 | HR 70 | Temp 97.7°F | Ht 69.0 in | Wt 255.0 lb

## 2018-04-10 DIAGNOSIS — J45909 Unspecified asthma, uncomplicated: Secondary | ICD-10-CM | POA: Diagnosis not present

## 2018-04-10 DIAGNOSIS — E782 Mixed hyperlipidemia: Secondary | ICD-10-CM

## 2018-04-10 DIAGNOSIS — E1122 Type 2 diabetes mellitus with diabetic chronic kidney disease: Secondary | ICD-10-CM

## 2018-04-10 DIAGNOSIS — Z79899 Other long term (current) drug therapy: Secondary | ICD-10-CM | POA: Diagnosis not present

## 2018-04-10 DIAGNOSIS — F17200 Nicotine dependence, unspecified, uncomplicated: Secondary | ICD-10-CM

## 2018-04-10 DIAGNOSIS — I10 Essential (primary) hypertension: Secondary | ICD-10-CM

## 2018-04-10 DIAGNOSIS — J449 Chronic obstructive pulmonary disease, unspecified: Secondary | ICD-10-CM

## 2018-04-10 DIAGNOSIS — E559 Vitamin D deficiency, unspecified: Secondary | ICD-10-CM | POA: Diagnosis not present

## 2018-04-10 DIAGNOSIS — F17210 Nicotine dependence, cigarettes, uncomplicated: Secondary | ICD-10-CM | POA: Diagnosis not present

## 2018-04-10 DIAGNOSIS — N182 Chronic kidney disease, stage 2 (mild): Secondary | ICD-10-CM | POA: Diagnosis not present

## 2018-04-10 NOTE — Patient Instructions (Signed)
Aim for 7+ servings of fruits and vegetables daily  80+ fluid ounces of water or unsweet tea for healthy kidneys  Limit alcohol, avoid drinking calories (soda)  Limit animal fats in diet for cholesterol and heart health - choose grass fed whenever available  Aim for low stress - take time to unwind and care for your mental health  Aim for 150 min of moderate intensity exercise weekly for heart health, and weights twice weekly for bone health  Aim for 7-9 hours of sleep daily    Drink 1/2 your body weight in fluid ounces of water daily; drink a tall glass of water 30 min before meals  Don't eat until you're stuffed- listen to your stomach and eat until you are 80% full   Try eating off of a salad plate; wait 10 min after finishing before going back for seconds  Start by eating the vegetables on your plate; aim for 50% of your meals to be fruits or vegetables  Then eat your protein - lean meats (grass fed if possible), fish, beans, nuts in moderation  Eat your carbs/starch last ONLY if you still are hungry. If you can, stop before finishing it all  Avoid sugar and flour - the closer it looks to it's original form in nature, typically the better it is for you  Splurge in moderation - "assign" days when you get to splurge and have the "bad stuff" - I like to follow a 80% - 20% plan- "good" choices 80 % of the time, "bad" choices in moderation 20% of the time  Simple equation is: Calories out > calories in = weight loss - even if you eat the bad stuff, if you limit portions, you will still lose weight      When it comes to diets, agreement about the perfect plan isn't easy to find, even among the experts. Experts at the [employer] developed an idea known as the Healthy Eating Plate. Just imagine a plate divided into logical, healthy portions.  The emphasis is on diet quality:  Load up on vegetables and fruits - one-half of your plate: Aim for color and  variety, and remember that potatoes don't count.  Go for whole grains - one-quarter of your plate: Whole wheat, barley, wheat berries, quinoa, oats, brown rice, and foods made with them. If you want pasta, go with whole wheat pasta.  Protein power - one-quarter of your plate: Fish, chicken, beans, and nuts are all healthy, versatile protein sources. Limit red meat.  The diet, however, does go beyond the plate, offering a few other suggestions.  Use healthy plant oils, such as olive, canola, soy, corn, sunflower and peanut. Check the labels, and avoid partially hydrogenated oil, which have unhealthy trans fats.  If you're thirsty, drink water. Coffee and tea are good in moderation, but skip sugary drinks and limit milk and dairy products to one or two daily servings.  The type of carbohydrate in the diet is more important than the amount. Some sources of carbohydrates, such as vegetables, fruits, whole grains, and beans-are healthier than others.  Finally, stay active.

## 2018-04-11 ENCOUNTER — Encounter: Payer: Self-pay | Admitting: Internal Medicine

## 2018-04-20 ENCOUNTER — Other Ambulatory Visit: Payer: Self-pay | Admitting: Internal Medicine

## 2018-05-09 ENCOUNTER — Other Ambulatory Visit: Payer: Self-pay | Admitting: Physician Assistant

## 2018-05-09 DIAGNOSIS — E785 Hyperlipidemia, unspecified: Secondary | ICD-10-CM

## 2018-05-09 DIAGNOSIS — E782 Mixed hyperlipidemia: Secondary | ICD-10-CM

## 2018-05-19 ENCOUNTER — Other Ambulatory Visit: Payer: Self-pay | Admitting: Internal Medicine

## 2018-06-22 ENCOUNTER — Other Ambulatory Visit: Payer: Self-pay | Admitting: Adult Health

## 2018-07-15 NOTE — Progress Notes (Signed)
Assessment and Plan:  Essential hypertension - continue medications, DASH diet, exercise and monitor at home. Call if greater than 130/80.  - CBC with Differential/Platelet - BASIC METABOLIC PANEL WITH GFR - Hepatic function panel - TSH  Diabetes mellitus without complication (Luis Bonilla) Discussed general issues about diabetes pathophysiology and management., Educational material distributed., Suggested low cholesterol diet., Encouraged aerobic exercise., Discussed foot care., Reminded to get yearly retinal exam. - discussed hypoglycemia, start to check sugars - Hemoglobin A1c   Hyperlipidemia -continue medications, check lipids, decrease fatty foods, increase activity.  - Lipid panel  Morbid obesity, unspecified obesity type (Luis Bonilla) Obesity with co morbidities- long discussion about weight loss, diet, and exercise - Lipid panel - Hemoglobin A1c   Vitamin D deficiency - VITAMIN D 25 Hydroxy (Vit-D Deficiency, Fractures)  Medication management - Magnesium  Chronic obstructive pulmonary disease, unspecified COPD type (Luis Bonilla) He is on breo start on wellbutrin after the holidays, stop smoking   Continue diet and meds as discussed. Further disposition pending results of labs. Discussed med's effects and SE's.  Future Appointments  Date Time Provider [place]  12/10/2018  3:00 PM Vicie Mutters, PA-C [id number] None     HPI 62 y.o. male  presents for 3 month follow up with hypertension, hyperlipidemia, diabetes and vitamin D. His blood pressure has not been controlled at home he does not check it at home,  today their BP is BP: 128/64  He does not workout. He denies chest pain, shortness of breath, dizziness.  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   BMI is Body mass index is 38.1 kg/m., he is working on diet and exercise. Wt Readings from Last 3  Encounters:  07/17/18 258 lb (117 kg)  04/10/18 255 lb (115.7 kg)  11/20/17 258 lb 12.8 oz (117.4 kg)   He is on cholesterol medication and denies myalgias. His cholesterol is not at goal of 70l. The cholesterol last visit was:   Lab Results  Component Value Date   CHOL 131 04/10/2018   HDL 33 (L) 04/10/2018   LDLCALC 81 04/10/2018   TRIG 91 04/10/2018   CHOLHDL 4.0 04/10/2018   He has been working on diet and exercise for Diabetes with CKD,  he is on 1 MF a day and he takes 1/2 glimepiride morning and night,  does not check sugars, and denies polydipsia, polyuria and visual disturbances. Last A1C in the office was:  Lab Results  Component Value Date   HGBA1C 7.7 (H) 04/10/2018   Lab Results  Component Value Date   GFRNONAA 81 04/10/2018   Patient is on Vitamin D supplement, 10,000 daily. Lab Results  Component Value Date   VD25OH 23 (L) 04/10/2018       Current Medications:  Current Outpatient Medications on File Prior to Visit  Medication Sig Dispense Refill  . albuterol (PROAIR HFA) 108 (90 Base) MCG/ACT inhaler 1-2 INHALATIONS 5-10 MINUTES APART 4 X / DAY OR EVERY 4 HOURS TO RESCUE ASTHMA 25.5 Inhaler 3  . BREO ELLIPTA 100-25 MCG/INH AEPB INHALE 1 PUFF INTO THE LUNGS DAILY. RINSE MOUTH WITH WATER AFTER EACH USE 180 each 0  . Cholecalciferol (VITAMIN D PO) Take 5,000 Int'l Units by mouth daily.    . fluticasone (FLONASE) 50 MCG/ACT nasal spray USE 1 SPRAY(S) INTRANASALLY ONCE A DAY AS NEEDED  0  .  glimepiride (AMARYL) 4 MG tablet TAKE 1/2 TO 1 TABLET BY MOUTH TWICE DAILY FOR DIABETES 180 tablet 1  . glucose blood test strip 1 each by Other route as needed for other. Test Blood Sugar Once daily 100 each PRN  . lovastatin (MEVACOR) 20 MG tablet TAKE 1 TABLET BY MOUTH EVERYDAY AT BEDTIME 90 tablet 1  . metFORMIN (GLUCOPHAGE-XR) 500 MG 24 hr tablet TAKE 2 TABLETS BY MOUTH TWICE A DAY WITH A MEAL 360 tablet 1   No current facility-administered medications on file prior to  visit.    Medical History:  Past Medical History:  Diagnosis Date  . Asthma   . Erectile dysfunction   . Hyperlipidemia   . Hypertension   . Other testicular hypofunction   . Type II or unspecified type diabetes mellitus without mention of complication, not stated as uncontrolled    Allergies:  Allergies  Allergen Reactions  . Flax [Bio-Flax]     Review of Systems  Constitutional: Negative.   HENT: Negative.   Eyes: Negative.   Respiratory: Negative.   Cardiovascular: Negative.   Gastrointestinal: Negative.   Genitourinary: Negative.   Musculoskeletal: Positive for back pain. Negative for falls, joint pain, myalgias and neck pain.  Skin: Negative.   Neurological: Negative.   Endo/Heme/Allergies: Negative.   Psychiatric/Behavioral: Negative.      Family history- Review and unchanged Social history- Review and unchanged Physical Exam: BP 128/64   Pulse 61   Temp 97.9 F (36.6 C)   Resp 18   Ht 5\' 9"  (1.753 m)   Wt 258 lb (117 kg)   SpO2 96%   BMI 38.10 kg/m  Wt Readings from Last 3 Encounters:  07/17/18 258 lb (117 kg)  04/10/18 255 lb (115.7 kg)  11/20/17 258 lb 12.8 oz (117.4 kg)   General Appearance: Well nourished, in no apparent distress. Eyes: PERRLA, EOMs, conjunctiva no swelling or erythema Sinuses: No Frontal/maxillary tenderness ENT/Mouth: Ext aud canals clear, TMs without erythema, bulging. No erythema, swelling, or exudate on post pharynx.  Tonsils not swollen or erythematous. Hearing normal.  Neck: Supple, thyroid normal.  Respiratory: Respiratory effort normal, BS equal bilaterally without rales, rhonchi, wheezing or stridor.  Cardio: RRR with no MRGs. Brisk peripheral pulses without edema.  Abdomen: Soft, + BS.  Non tender, no guarding, rebound, hernias, masses. Lymphatics: Non tender without lymphadenopathy.  Musculoskeletal: Full ROM, 5/5 strength, normal gait.  Skin: Warm, dry without rashes, lesions, ecchymosis.  Neuro: Cranial nerves  intact. No cerebellar symptoms. Sensation intact.  Psych: Awake and oriented X 3, normal affect, Insight and Judgment appropriate.    Vicie Mutters 2:33 PM

## 2018-07-17 ENCOUNTER — Encounter: Payer: Self-pay | Admitting: Physician Assistant

## 2018-07-17 ENCOUNTER — Other Ambulatory Visit: Payer: Self-pay | Admitting: Adult Health

## 2018-07-17 ENCOUNTER — Ambulatory Visit (INDEPENDENT_AMBULATORY_CARE_PROVIDER_SITE_OTHER): Payer: 59 | Admitting: Physician Assistant

## 2018-07-17 ENCOUNTER — Other Ambulatory Visit: Payer: Self-pay | Admitting: Internal Medicine

## 2018-07-17 VITALS — BP 128/64 | HR 61 | Temp 97.9°F | Resp 18 | Ht 69.0 in | Wt 258.0 lb

## 2018-07-17 DIAGNOSIS — E1122 Type 2 diabetes mellitus with diabetic chronic kidney disease: Secondary | ICD-10-CM

## 2018-07-17 DIAGNOSIS — I10 Essential (primary) hypertension: Secondary | ICD-10-CM

## 2018-07-17 DIAGNOSIS — E782 Mixed hyperlipidemia: Secondary | ICD-10-CM

## 2018-07-17 DIAGNOSIS — E1169 Type 2 diabetes mellitus with other specified complication: Secondary | ICD-10-CM | POA: Diagnosis not present

## 2018-07-17 DIAGNOSIS — N521 Erectile dysfunction due to diseases classified elsewhere: Principal | ICD-10-CM

## 2018-07-17 DIAGNOSIS — F17200 Nicotine dependence, unspecified, uncomplicated: Secondary | ICD-10-CM

## 2018-07-17 DIAGNOSIS — J449 Chronic obstructive pulmonary disease, unspecified: Secondary | ICD-10-CM

## 2018-07-17 DIAGNOSIS — N182 Chronic kidney disease, stage 2 (mild): Secondary | ICD-10-CM

## 2018-07-17 NOTE — Patient Instructions (Signed)
Diabetes or even increased sugars put you at 300% increased risk of heart attack and stroke.  ALSO BEING DIABETIC YOU MAY NOT HAVE ANY PAIN WITH A HEART ATTACK.  Even worse of a chance of no pain if you are a woman.  It is very unlikely that you will have any pain with a heart attack. Likely your symptoms will be very subtle, even for very severe disease.  Your symptoms for a heart attack will likely occur when you exert your self or exercise and include: Shortness of breath Sweating Nausea Dizziness Fast or irregular heart beats Fatigue   It makes me feel better if my diabetics get their heart rate up with exercise once or twice a week and pay close attention to your body. If there is ANY change in your exercise capacity or if you have symptoms above, please STOP and call 911 or call to come to the office.   PLEASE REMEMBER:  Diabetes is preventable! Up to 80 percent of complications and morbidities among individuals with type 2 diabetes can be prevented, delayed, or effectively treated and minimized with regular visits to a health professional, appropriate monitoring and medication, and a healthy diet and lifestyle.   Here is some information to help you keep your heart healthy: Move it! - Aim for 30 mins of activity every day. Take it slowly at first. Talk to [place] before starting any new exercise program.   Lose it.  -Body Mass Index (BMI) can indicate if you need to lose weight. A healthy range is 18.5-24.9. For a BMI calculator, go to [employer]  Waist Management -Excess abdominal fat is a risk factor for heart disease, diabetes, asthma, stroke and more. Ideal waist circumference is less than 35" for women and less than 40" for men.   Eat Right -focus on fruits, vegetables, whole grains, and meals you make yourself. Avoid foods with trans fat and high sugar/sodium content.   Snooze or Snore? - Loud snoring can be a sign of sleep apnea, a significant risk factor for high blood  pressure, heart attach, stroke, and heart arrhythmias.  Kick the habit -Quit Smoking! Avoid second hand smoke. A single cigarette raises your blood pressure for 20 mins and increases the risk of heart attack and stroke for the next 24 hours.   Are Aspirin and Supplements right for you? -Add ENTERIC COATED low dose 81 mg Aspirin daily OR can do every other day if you have easy bruising to protect your heart and head. As well as to reduce risk of Colon Cancer by 20 %, Skin Cancer by 26 % , Melanoma by 46% and Pancreatic cancer by 60%  Say "No to Stress -There may be little you can do about problems that cause stress. However, techniques such as long walks, meditation, and exercise can help you manage it.   Start Now! - Make changes one at a time and set reasonable goals to increase your likelihood of success.        Bad carbs also include fruit juice, alcohol, and sweet tea. These are empty calories that do not signal to your brain that you are full.   Please remember the good carbs are still carbs which convert into sugar. So please measure them out no more than 1/2-1 cup of rice, oatmeal, pasta, and beans  Veggies are however free foods! Pile them on.   Not all fruit is created equal. Please see the list below, the fruit at the bottom is higher in sugars  than the fruit at the top. Please avoid all dried fruits.         Stroke Prevention Some medical conditions and behaviors are associated with a higher chance of having a stroke. You can help prevent a stroke by making nutrition, lifestyle, and other changes, including managing any medical conditions you may have. What nutrition changes can be made?  Eat healthy foods. You can do this by: ? Choosing foods high in fiber, such as fresh fruits and vegetables and whole grains. ? Eating at least 5 or more servings of fruits and vegetables a day. Try to fill half of your plate at each meal with fruits and vegetables. ? Choosing lean  protein foods, such as lean cuts of meat, poultry without skin, fish, tofu, beans, and nuts. ? Eating low-fat dairy products. ? Avoiding foods that are high in salt (sodium). This can help lower blood pressure. ? Avoiding foods that have saturated fat, trans fat, and cholesterol. This can help prevent high cholesterol. ? Avoiding processed and premade foods.  Follow your health care provider's specific guidelines for losing weight, controlling high blood pressure (hypertension), lowering high cholesterol, and managing diabetes. These may include: ? Reducing your daily calorie intake. ? Limiting your daily sodium intake to 1,500 milligrams (mg). ? Using only healthy fats for cooking, such as olive oil, canola oil, or sunflower oil. ? Counting your daily carbohydrate intake. What lifestyle changes can be made?  Maintain a healthy weight. Talk to your health care provider about your ideal weight.  Get at least 30 minutes of moderate physical activity at least 5 days a week. Moderate activity includes brisk walking, biking, and swimming.  Do not use any products that contain nicotine or tobacco, such as cigarettes and e-cigarettes. If you need help quitting, ask your health care provider. It may also be helpful to avoid exposure to secondhand smoke.  Limit alcohol intake to no more than 1 drink a day for nonpregnant women and 2 drinks a day for men. One drink equals 12 oz of beer, 5 oz of wine, or 1 oz of hard liquor.  Stop any illegal drug use.  Avoid taking birth control pills. Talk to your health care provider about the risks of taking birth control pills if: ? You are over 53 years old. ? You smoke. ? You get migraines. ? You have ever had a blood clot. What other changes can be made?  Manage your cholesterol levels. ? Eating a healthy diet is important for preventing high cholesterol. If cholesterol cannot be managed through diet alone, you may also need to take medicines. ? Take  any prescribed medicines to control your cholesterol as told by your health care provider.  Manage your diabetes. ? Eating a healthy diet and exercising regularly are important parts of managing your blood sugar. If your blood sugar cannot be managed through diet and exercise, you may need to take medicines. ? Take any prescribed medicines to control your diabetes as told by your health care provider.  Control your hypertension. ? To reduce your risk of stroke, try to keep your blood pressure below 130/80. ? Eating a healthy diet and exercising regularly are an important part of controlling your blood pressure. If your blood pressure cannot be managed through diet and exercise, you may need to take medicines. ? Take any prescribed medicines to control hypertension as told by your health care provider. ? Ask your health care provider if you should monitor your blood pressure at  home. ? Have your blood pressure checked every year, even if your blood pressure is normal. Blood pressure increases with age and some medical conditions.  Get evaluated for sleep disorders (sleep apnea). Talk to your health care provider about getting a sleep evaluation if you snore a lot or have excessive sleepiness.  Take over-the-counter and prescription medicines only as told by your health care provider. Aspirin or blood thinners (antiplatelets or anticoagulants) may be recommended to reduce your risk of forming blood clots that can lead to stroke.  Make sure that any other medical conditions you have, such as atrial fibrillation or atherosclerosis, are managed. What are the warning signs of a stroke? The warning signs of a stroke can be easily remembered as BEFAST.  B is for balance. Signs include: ? Dizziness. ? Loss of balance or coordination. ? Sudden trouble walking.  E is for eyes. Signs include: ? A sudden change in vision. ? Trouble seeing.  F is for face. Signs include: ? Sudden weakness or  numbness of the face. ? The face or eyelid drooping to one side.  A is for arms. Signs include: ? Sudden weakness or numbness of the arm, usually on one side of the body.  S is for speech. Signs include: ? Trouble speaking (aphasia). ? Trouble understanding.  T is for time. ? These symptoms may represent a serious problem that is an emergency. Do not wait to see if the symptoms will go away. Get medical help right away. Call your local emergency services (911 in the [place] Do not drive yourself to the hospital.  Other signs of stroke may include: ? A sudden, severe headache with no known cause. ? Nausea or vomiting. ? Seizure.  Where to find more information: For more information, visit:  American Stroke Association: www.strokeassociation.org  National Stroke Association: www.stroke.org  Summary  You can prevent a stroke by eating healthy, exercising, not smoking, limiting alcohol intake, and managing any medical conditions you may have.  Do not use any products that contain nicotine or tobacco, such as cigarettes and e-cigarettes. If you need help quitting, ask your health care provider. It may also be helpful to avoid exposure to secondhand smoke.  Remember BEFAST for warning signs of stroke. Get help right away if you or a loved one has any of these signs. This information is not intended to replace advice given to you by your health care provider. Make sure you discuss any questions you have with your health care provider. Document Released: 11/16/2004 Document Revised: 11/14/2016 Document Reviewed: 11/14/2016 Elsevier Interactive Patient Education  [employer].

## 2018-07-18 LAB — CBC WITH DIFFERENTIAL/PLATELET
BASOS ABS: 32 {cells}/uL (ref 0–200)
Basophils Relative: 0.3 %
Eosinophils Absolute: 137 cells/uL (ref 15–500)
Eosinophils Relative: 1.3 %
HEMATOCRIT: 42.7 % (ref 38.5–50.0)
HEMOGLOBIN: 14.5 g/dL (ref 13.2–17.1)
LYMPHS ABS: 2394 {cells}/uL (ref 850–3900)
MCH: 29 pg (ref 27.0–33.0)
MCHC: 34 g/dL (ref 32.0–36.0)
MCV: 85.4 fL (ref 80.0–100.0)
MPV: 10.3 fL (ref 7.5–12.5)
Monocytes Relative: 5.6 %
NEUTROS ABS: 7350 {cells}/uL (ref 1500–7800)
Neutrophils Relative %: 70 %
Platelets: 197 10*3/uL (ref 140–400)
RBC: 5 10*6/uL (ref 4.20–5.80)
RDW: 13.1 % (ref 11.0–15.0)
Total Lymphocyte: 22.8 %
WBC: 10.5 10*3/uL (ref 3.8–10.8)
WBCMIX: 588 {cells}/uL (ref 200–950)

## 2018-07-18 LAB — COMPLETE METABOLIC PANEL WITH GFR
AG Ratio: 1.3 (calc) (ref 1.0–2.5)
ALT: 11 U/L (ref 9–46)
AST: 14 U/L (ref 10–35)
Albumin: 3.9 g/dL (ref 3.6–5.1)
Alkaline phosphatase (APISO): 82 U/L (ref 40–115)
BUN: 15 mg/dL (ref 7–25)
CALCIUM: 9.7 mg/dL (ref 8.6–10.3)
CO2: 30 mmol/L (ref 20–32)
Chloride: 100 mmol/L (ref 98–110)
Creat: 1.19 mg/dL (ref 0.70–1.25)
GFR, EST NON AFRICAN AMERICAN: 66 mL/min/{1.73_m2} (ref >=60)
GFR, Est African American: 76 mL/min/{1.73_m2} (ref >=60)
Globulin: 3 g/dL (calc) (ref 1.9–3.7)
Glucose, Bld: 268 mg/dL — ABNORMAL HIGH (ref 65–99)
POTASSIUM: 4.4 mmol/L (ref 3.5–5.3)
Sodium: 138 mmol/L (ref 135–146)
Total Bilirubin: 0.4 mg/dL (ref 0.2–1.2)
Total Protein: 6.9 g/dL (ref 6.1–8.1)

## 2018-07-18 LAB — LIPID PANEL
CHOL/HDL RATIO: 4.3 (calc) (ref <5.0)
Cholesterol: 152 mg/dL (ref <200)
HDL: 35 mg/dL — AB (ref >40)
LDL CHOLESTEROL (CALC): 84 mg/dL
NON-HDL CHOLESTEROL (CALC): 117 mg/dL (ref <130)
TRIGLYCERIDES: 238 mg/dL — AB (ref <150)

## 2018-07-18 LAB — HEMOGLOBIN A1C
HEMOGLOBIN A1C: 8 %{Hb} — AB (ref <5.7)
Mean Plasma Glucose: 183 (calc)
eAG (mmol/L): 10.1 (calc)

## 2018-07-18 LAB — TSH: TSH: 1.95 mIU/L (ref 0.40–4.50)

## 2018-07-23 LAB — COMPLETE METABOLIC PANEL WITH GFR
AG Ratio: 1.4 (calc) (ref 1.0–2.5)
ALT: 12 U/L (ref 9–46)
AST: 14 U/L (ref 10–35)
Albumin: 4.1 g/dL (ref 3.6–5.1)
Alkaline phosphatase (APISO): 74 U/L (ref 40–115)
BILIRUBIN TOTAL: 0.5 mg/dL (ref 0.2–1.2)
BUN: 12 mg/dL (ref 7–25)
CO2: 29 mmol/L (ref 20–32)
CREATININE: 1 mg/dL (ref 0.70–1.25)
Calcium: 9.3 mg/dL (ref 8.6–10.3)
Chloride: 103 mmol/L (ref 98–110)
GFR, EST NON AFRICAN AMERICAN: 81 mL/min/{1.73_m2} (ref >=60)
GFR, Est African American: 94 mL/min/{1.73_m2} (ref >=60)
GLOBULIN: 2.9 g/dL (ref 1.9–3.7)
GLUCOSE: 91 mg/dL (ref 65–99)
POTASSIUM: 4.3 mmol/L (ref 3.5–5.3)
SODIUM: 139 mmol/L (ref 135–146)
TOTAL PROTEIN: 7 g/dL (ref 6.1–8.1)

## 2018-07-23 LAB — CBC WITH DIFFERENTIAL/PLATELET
BASOS PCT: 0.3 %
Basophils Absolute: 29 cells/uL (ref 0–200)
EOS ABS: 115 {cells}/uL (ref 15–500)
Eosinophils Relative: 1.2 %
HCT: 43.1 % (ref 38.5–50.0)
HEMOGLOBIN: 14.7 g/dL (ref 13.2–17.1)
Lymphs Abs: 3235 cells/uL (ref 850–3900)
MCH: 28.7 pg (ref 27.0–33.0)
MCHC: 34.1 g/dL (ref 32.0–36.0)
MCV: 84 fL (ref 80.0–100.0)
MONOS PCT: 6.6 %
MPV: 9.8 fL (ref 7.5–12.5)
NEUTROS ABS: 5587 {cells}/uL (ref 1500–7800)
Neutrophils Relative %: 58.2 %
PLATELETS: 225 10*3/uL (ref 140–400)
RBC: 5.13 10*6/uL (ref 4.20–5.80)
RDW: 13 % (ref 11.0–15.0)
TOTAL LYMPHOCYTE: 33.7 %
WBC mixed population: 634 cells/uL (ref 200–950)
WBC: 9.6 10*3/uL (ref 3.8–10.8)

## 2018-07-23 LAB — LIPID PANEL
CHOL/HDL RATIO: 4 (calc) (ref <5.0)
CHOLESTEROL: 131 mg/dL (ref <200)
HDL: 33 mg/dL — AB (ref >40)
LDL CHOLESTEROL (CALC): 81 mg/dL
Non-HDL Cholesterol (Calc): 98 mg/dL (calc) (ref <130)
TRIGLYCERIDES: 91 mg/dL (ref <150)

## 2018-07-23 LAB — TSH: TSH: 1.8 m[IU]/L (ref 0.40–4.50)

## 2018-07-23 LAB — HEMOGLOBIN A1C
HEMOGLOBIN A1C: 7.7 %{Hb} — AB (ref <5.7)
Mean Plasma Glucose: 174 (calc)
eAG (mmol/L): 9.7 (calc)

## 2018-07-23 LAB — VITAMIN D 25 HYDROXY (VIT D DEFICIENCY, FRACTURES): Vit D, 25-Hydroxy: 23 ng/mL — ABNORMAL LOW (ref 30–100)

## 2018-07-29 ENCOUNTER — Other Ambulatory Visit: Payer: Self-pay | Admitting: Physician Assistant

## 2018-07-29 DIAGNOSIS — J4520 Mild intermittent asthma, uncomplicated: Secondary | ICD-10-CM

## 2018-08-21 ENCOUNTER — Other Ambulatory Visit: Payer: Self-pay | Admitting: Internal Medicine

## 2018-08-21 DIAGNOSIS — E119 Type 2 diabetes mellitus without complications: Secondary | ICD-10-CM

## 2018-09-02 ENCOUNTER — Other Ambulatory Visit: Payer: Self-pay | Admitting: Physician Assistant

## 2018-09-02 DIAGNOSIS — E1169 Type 2 diabetes mellitus with other specified complication: Secondary | ICD-10-CM

## 2018-09-02 DIAGNOSIS — N521 Erectile dysfunction due to diseases classified elsewhere: Secondary | ICD-10-CM

## 2018-09-02 DIAGNOSIS — E119 Type 2 diabetes mellitus without complications: Secondary | ICD-10-CM

## 2018-09-23 ENCOUNTER — Other Ambulatory Visit: Payer: Self-pay | Admitting: Internal Medicine

## 2018-10-08 ENCOUNTER — Other Ambulatory Visit: Payer: Self-pay | Admitting: Physician Assistant

## 2018-10-08 DIAGNOSIS — I10 Essential (primary) hypertension: Secondary | ICD-10-CM

## 2018-10-29 ENCOUNTER — Other Ambulatory Visit: Payer: Self-pay | Admitting: Internal Medicine

## 2018-11-08 ENCOUNTER — Other Ambulatory Visit: Payer: Self-pay | Admitting: Adult Health

## 2018-11-08 DIAGNOSIS — E785 Hyperlipidemia, unspecified: Secondary | ICD-10-CM

## 2018-11-08 DIAGNOSIS — E782 Mixed hyperlipidemia: Secondary | ICD-10-CM

## 2018-12-09 NOTE — Progress Notes (Signed)
Complete Physical  Assessment and Plan:  Essential hypertension - continue medications, DASH diet, exercise and monitor at home. Call if greater than 130/80.  - start to eat better, if still above 140/90 can increase cozaar to 100mg  a day (2 a day) -     CBC with Differential/Platelet -     BASIC METABOLIC PANEL WITH GFR -     Hepatic function panel -     TSH -     Urinalysis, Routine w reflex microscopic -     Microalbumin / creatinine urine ratio -     EKG 12-Lead -     [place], RETROPERITNL ABD,  LTD  Chronic obstructive pulmonary disease, unspecified COPD type ([place]) Advised to stop smoking, continue meds/Breo.  Patient has 100 pack year smoking history Will think about low dose CT and will discuss next OV  Diabetes mellitus with CKD ([place]) Discussed general issues about diabetes pathophysiology and management., Educational material distributed., Suggested low cholesterol diet., Encouraged aerobic exercise., Discussed foot care., Reminded to get yearly retinal exam. 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 -     lovastatin (MEVACOR) 20 MG tablet; Take 1 tablet (20 mg total) by mouth at bedtime. -     Lipid panel  Morbid obesity ([facility]) - follow up 4 months for progress monitoring - increase veggies, decrease carbs - long discussion about weight loss, diet, and exercise  Erectile dysfunction associated with type 2 diabetes mellitus ([place]) Discussed general issues about diabetes pathophysiology and management., Educational material distributed., Suggested low cholesterol diet., Encouraged aerobic exercise., Discussed foot care., Reminded to get yearly retinal exam.  Hypogonadism in male May be interested in the shots, get on zinc, weight loss advised -      Testosterone  Uncomplicated asthma, unspecified asthma severity, unspecified whether persistent Continue breo, weight loss advised Patient needs proair, not ventolin, will try to get through [place]  Tobacco use disorder -     [place], RETROPERITNL ABD,  LTD Smoking cessation-  instruction/counseling given, counseled patient on the dangers of tobacco use, advised patient to stop smoking, and reviewed strategies to maximize success, patient not ready to quit at this time.   Medication management -     Magnesium  Vitamin D deficiency -     VITAMIN D 25 Hydroxy (Vit-D Deficiency, Fractures)  Morbid obesity Discussed diet/exercise, weight management and risk modification  Screening PSA (prostate specific antigen) -     Will get next year  Screen colon cancer Will check into cologuard, VERY long discussion about getting colonoscopy.  Discussed that Colon cancer is 3rd most diagnosed cancer and 2nd leading cause of death in both men and women 68 years of age and older despite being one of the most preventable and treatable cancers if found early. Despite this information patient still declines colonoscopy and understands risk of cancer and death.  WILLING TO GET COLOGUARD  Discussed med's effects and SE's. Screening labs and tests as requested with regular follow-up as recommended. Future Appointments  Date Time Provider [place]  12/17/2019  3:00 PM Vicie Mutters, PA-C [id number] None    HPI 63 y.o. male  presents for a complete physical. His blood pressure has been controlled at home, today their BP is BP: 136/78 He does not workout. He denies chest pain, shortness of breath,  dizziness.   He has asthma/COPD, is on breo, did not do well with anoro, and will occ use albutero. Has had ventolin in the past, it is not as affective as proair, patient needs the proair filled.  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 1-2 packs a day for 50 years. Suggest low dose CT, patient will think about it.   He is on cholesterol medication, lovastatin 20mg  but admits to not taking it regularly and denies myalgias. His cholesterol is not at goal, less than 70. The cholesterol last visit was:   Lab Results  Component Value Date   CHOL 152 07/17/2018   HDL 35 (L) 07/17/2018   LDLCALC 84 07/17/2018   TRIG 238 (H) 07/17/2018   CHOLHDL 4.3 07/17/2018   He has not been working on diet and exercise for diabetes, and denies foot ulcerations, hypoglycemia , paresthesia of the feet, polydipsia, polyuria and visual disturbances. He is on ACE and on bASA and metformin once a day and glimepiride, he is on 2mg  BID, has not been checking sugars. Tried invokana but could not tolerate it.   Last A1C in the office was:  Lab Results  Component Value Date   HGBA1C 8.0 (H) 07/17/2018   Lab Results  Component Value Date   GFRNONAA 66 07/17/2018   Patient is on Vitamin D supplement, has been out of it, will get more  Lab Results  Component Value Date   VD25OH 23 (L) 04/10/2018   BMI is Body mass index is 38.31 kg/m., he is working on diet and exercise. Due to his obesity/low testosterone he has ED and uses cialis PRN.  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  Wt Readings from Last 3 Encounters:  12/10/18 263 lb 3.2 oz (119.4 kg)  07/17/18 258 lb (117 kg)  04/10/18 255 lb (115.7 kg)   Lab Results  Component Value Date   PSA 0.3 11/20/2017   PSA 0.23 03/13/2016   PSA 0.22 01/04/2015    Current Medications:  Current Outpatient Medications on File Prior to Visit  Medication Sig Dispense Refill  . BREO ELLIPTA 100-25 MCG/INH AEPB INHALE 1 PUFF INTO THE LUNGS DAILY. RINSE MOUTH WITH WATER AFTER EACH USE 180 each 0  . Cholecalciferol (VITAMIN D PO) Take 5,000 Int'l Units by mouth daily.    . fluticasone (FLONASE) 50 MCG/ACT nasal spray USE 1 SPRAY(S)  INTRANASALLY ONCE A DAY AS NEEDED  0  . glimepiride (AMARYL) 4 MG tablet TAKE 1/2 TO 1 TABLET BY MOUTH TWICE DAILY FOR DIABETES 180 tablet 1  . glucose blood test strip 1 each by Other route as needed for other. Test Blood Sugar Once daily 100 each PRN  . losartan (COZAAR) 50 MG tablet TAKE 1 TABLET BY MOUTH EVERY DAY 90 tablet 0  . lovastatin (MEVACOR) 20 MG tablet TAKE 1 TABLET BY MOUTH EVERYDAY AT BEDTIME 90 tablet 1  . metFORMIN (GLUCOPHAGE-XR) 500 MG 24 hr tablet TAKE 2 TABLETS BY MOUTH TWICE A DAY WITH A MEAL 360 tablet 1  . tadalafil (ADCIRCA/CIALIS) 20 MG tablet TAKE 1 TABLET BY MOUTH EVERY DAY AS NEEDED 6 tablet 3   No current facility-administered medications on file prior to visit.    Immunization History  Administered Date(s) Administered  . Influenza-Unspecified 08/15/2017  . Tdap 01/04/2015   Health  Maintenance:  Tetanus: 2016 Pneumovax: declines Prevnar 13: when 67 Flu vaccine: 2019 AT work Zostavax: declines DEXA:N/A Colonoscopy: declines- long discussion, understands risk will call about cologuard EGD: N/A  Eye Doctor: 10/14/2017 Dr. Kerin Ransom Dentist:None  Medical History:  Past Medical History:  Diagnosis Date  . Asthma   . Erectile dysfunction   . Hyperlipidemia   . Hypertension   . Other testicular hypofunction   . Type II or unspecified type diabetes mellitus without mention of complication, not stated as uncontrolled    Allergies Allergies  Allergen Reactions  . Flax [Bio-Flax]     SURGICAL HISTORY He  has no past surgical history on file. FAMILY HISTORY His family history includes Cancer in his father; Diabetes in his mother; Hypertension in his mother; Stroke in his mother. SOCIAL HISTORY He  reports that he has been smoking. He has been smoking about 2.00 packs per day. He has never used smokeless tobacco. He reports that he does not drink alcohol or use drugs. 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   Review of Systems  Constitutional:  Negative.   HENT: Negative.   Eyes: Negative.   Respiratory: Negative.   Cardiovascular: Negative.   Gastrointestinal: Negative.   Genitourinary: Negative.   Musculoskeletal: Negative for back pain, falls, joint pain, myalgias and neck pain.  Skin: Negative.   Neurological: Negative.   Endo/Heme/Allergies: Negative.   Psychiatric/Behavioral: Negative for depression, hallucinations, memory loss, substance abuse and suicidal ideas. The patient is not nervous/anxious and does not have insomnia.      Physical Exam: Estimated body mass index is 38.31 kg/m as calculated from the following:   Height as of this encounter: 5' 9.5" (1.765 m).   Weight as of this encounter: 263 lb 3.2 oz (119.4 kg). Vitals:   12/10/18 1458  BP: 136/78  Pulse: 84  Temp: 98.1 F (36.7 C)  SpO2: 96%   General Appearance: Well nourished, in no apparent distress. Eyes: PERRLA, EOMs, conjunctiva no swelling or erythema, normal fundi and vessels. Sinuses: No Frontal/maxillary tenderness ENT/Mouth: Ext aud canals clear, normal light reflex with TMs without erythema, bulging. Good dentition. No erythema, swelling, or exudate on post pharynx. Tonsils not swollen or erythematous. Hearing normal.  Neck: Supple, thyroid normal. No bruits Respiratory: Respiratory effort normal, BS equal bilaterally with mild expiratory wheeze without rales, rhonchi, or stridor. Cardio: RRR without murmurs, rubs or gallops. Brisk peripheral pulses without edema.  Chest: symmetric, with normal excursions and percussion. Abdomen: Soft, obese +BS. Non tender, no guarding, rebound, hernias, masses, or organomegaly. .  Lymphatics: Non tender without lymphadenopathy.  Genitourinary: defer Musculoskeletal: Full ROM all peripheral extremities,5/5 strength, and normal gait. Skin: Warm, dry without rashes, lesions, ecchymosis.  Neuro: Cranial nerves intact, reflexes equal bilaterally. Normal muscle tone, no cerebellar symptoms. Sensation  intact.  Psych: Awake and oriented X 3, normal affect, Insight and Judgment appropriate.   EKG: WNL no changes. Aorta WNL    Vicie Mutters 3:23 PM

## 2018-12-10 ENCOUNTER — Encounter: Payer: Self-pay | Admitting: Physician Assistant

## 2018-12-10 ENCOUNTER — Ambulatory Visit: Payer: 59 | Admitting: Physician Assistant

## 2018-12-10 VITALS — BP 136/78 | HR 84 | Temp 98.1°F | Ht 69.5 in | Wt 263.2 lb

## 2018-12-10 DIAGNOSIS — E291 Testicular hypofunction: Secondary | ICD-10-CM

## 2018-12-10 DIAGNOSIS — Z136 Encounter for screening for cardiovascular disorders: Secondary | ICD-10-CM

## 2018-12-10 DIAGNOSIS — Z79899 Other long term (current) drug therapy: Secondary | ICD-10-CM

## 2018-12-10 DIAGNOSIS — Z0000 Encounter for general adult medical examination without abnormal findings: Secondary | ICD-10-CM | POA: Diagnosis not present

## 2018-12-10 DIAGNOSIS — J449 Chronic obstructive pulmonary disease, unspecified: Secondary | ICD-10-CM

## 2018-12-10 DIAGNOSIS — E1169 Type 2 diabetes mellitus with other specified complication: Secondary | ICD-10-CM

## 2018-12-10 DIAGNOSIS — Z0001 Encounter for general adult medical examination with abnormal findings: Secondary | ICD-10-CM

## 2018-12-10 DIAGNOSIS — E1122 Type 2 diabetes mellitus with diabetic chronic kidney disease: Secondary | ICD-10-CM | POA: Diagnosis not present

## 2018-12-10 DIAGNOSIS — F17210 Nicotine dependence, cigarettes, uncomplicated: Secondary | ICD-10-CM

## 2018-12-10 DIAGNOSIS — N521 Erectile dysfunction due to diseases classified elsewhere: Secondary | ICD-10-CM

## 2018-12-10 DIAGNOSIS — Z1211 Encounter for screening for malignant neoplasm of colon: Secondary | ICD-10-CM

## 2018-12-10 DIAGNOSIS — F17200 Nicotine dependence, unspecified, uncomplicated: Secondary | ICD-10-CM

## 2018-12-10 DIAGNOSIS — E559 Vitamin D deficiency, unspecified: Secondary | ICD-10-CM

## 2018-12-10 DIAGNOSIS — E782 Mixed hyperlipidemia: Secondary | ICD-10-CM | POA: Diagnosis not present

## 2018-12-10 DIAGNOSIS — I10 Essential (primary) hypertension: Secondary | ICD-10-CM

## 2018-12-10 DIAGNOSIS — J45909 Unspecified asthma, uncomplicated: Secondary | ICD-10-CM

## 2018-12-10 DIAGNOSIS — N182 Chronic kidney disease, stage 2 (mild): Secondary | ICD-10-CM

## 2018-12-10 MED ORDER — PROAIR HFA 108 (90 BASE) MCG/ACT IN AERS: 2.0000 | INHALATION_SPRAY | Freq: Four times a day (QID) | RESPIRATORY_TRACT | 1 refills | Status: DC | PRN

## 2018-12-10 NOTE — Patient Instructions (Signed)
Check out low dose CT scan as a screening test for smoking   SMOKING CESSATION  American cancer society  [phone] for more information or for a free program for smoking cessation help.   You can call QUIT SMART 1-800-QUIT-NOW for free nicotine patches or replacement therapy- if they are out- keep calling  [facility] Can call for smoking cessation classes, [phone]  If you have a smart phone, please look up Smoke Free app, this will help you stay on track and give you information about money you have saved, life that you have gained back and a ton of more information.     ADVANTAGES OF QUITTING SMOKING  Within 20 minutes, blood pressure decreases. Your pulse is at normal level.  After 8 hours, carbon monoxide levels in the blood return to normal. Your oxygen level increases.  After 24 hours, the chance of having a heart attack starts to decrease. Your breath, hair, and body stop smelling like smoke.  After 48 hours, damaged nerve endings begin to recover. Your sense of taste and smell improve.  After 72 hours, the body is virtually free of nicotine. Your bronchial tubes relax and breathing becomes easier.  After 2 to 12 weeks, lungs can hold more air. Exercise becomes easier and circulation improves.  After 1 year, the risk of coronary heart disease is cut in half.  After 5 years, the risk of stroke falls to the same as a nonsmoker.  After 10 years, the risk of lung cancer is cut in half and the risk of other cancers decreases significantly.  After 15 years, the risk of coronary heart disease drops, usually to the level of a nonsmoker.  You will have extra money to spend on things other than cigarettes.    COLOGUARD INFORMATION   Cologuard is an easy to use noninvasive colon cancer screening test based on the latest advances in stool DNA science.   Colon cancer is 3rd most diagnosed cancer and 2nd leading cause of death in both men and women 44  years of age and older despite being one of the most preventable and treatable cancers if found early.  4 of out 5 people diagnosed with colon cancer have NO prior family history.  When caught EARLY 90% of colon cancer is curable.   More than 92% of cologuard patients have NO out of pocket cost for screening however only your insurer can confirm how Cologuard would be covered for you. [facility] has a team of specialist that can help you contact your insurer and ask the right questions. Please call [phone] so they can help.   You will receive a short call from [place] support center at [employer], when you receive a call they will say they are from [place],  to confirm your mailing address and give you more information.  When they calll you, it will appear on the caller ID as "[employer]" or in some cases only this number will appear, [phone].   [facility] [employer] will ship your collection kit directly to you. You will collect a single stool sample in the privacy of your own home, no special preparation required. You will return the kit via [place] pre-paid shipping or pick-up, in the same box it arrived in. Then I will contact you to discuss your results after I receive them from the laboratory.   If you have any questions or concerns, [facility] Customer Support Specialist are available 24 hours a day, 7  days a week at [phone] or go to [email or web address].     Diabetes or even increased sugars put you at 300% increased risk of heart attack and stroke.  ALSO BEING DIABETIC YOU MAY NOT HAVE ANY PAIN WITH A HEART ATTACK.  Even worse of a chance of no pain if you are a woman.  It is very unlikely that you will have any pain with a heart attack. Likely your symptoms will be very subtle, even for very severe disease.  Your symptoms for a heart attack will likely occur when you exert your self or exercise and include: Shortness of  breath Sweating Nausea Dizziness Fast or irregular heart beats Fatigue   It makes me feel better if my diabetics get their heart rate up with exercise once or twice a week and pay close attention to your body. If there is ANY change in your exercise capacity or if you have symptoms above, please STOP and call 911 or call to come to the office.   PLEASE REMEMBER:  Diabetes is preventable! Up to 63 percent of complications and morbidities among individuals with type 2 diabetes can be prevented, delayed, or effectively treated and minimized with regular visits to a health professional, appropriate monitoring and medication, and a healthy diet and lifestyle.   Here is some information to help you keep your heart healthy: Move it! - Aim for 30 mins of activity every day. Take it slowly at first. Talk to [place] before starting any new exercise program.   Lose it.  -Body Mass Index (BMI) can indicate if you need to lose weight. A healthy range is 18.5-24.9. For a BMI calculator, go to [email or web address]  Waist Management -Excess abdominal fat is a risk factor for heart disease, diabetes, asthma, stroke and more. Ideal waist circumference is less than 35" for women and less than 40" for men.   Eat Right -focus on fruits, vegetables, whole grains, and meals you make yourself. Avoid foods with trans fat and high sugar/sodium content.   Snooze or Snore? - Loud snoring can be a sign of sleep apnea, a significant risk factor for high blood pressure, heart attach, stroke, and heart arrhythmias.  Kick the habit -Quit Smoking! Avoid second hand smoke. A single cigarette raises your blood pressure for 20 mins and increases the risk of heart attack and stroke for the next 24 hours.   Are Aspirin and Supplements right for you? -Add ENTERIC COATED low dose 81 mg Aspirin daily OR can do every other day if you have easy bruising to protect your heart and head. As well as to reduce risk of Colon Cancer by 20 %,  Skin Cancer by 26 % , Melanoma by 46% and Pancreatic cancer by 60%  Say "No to Stress -There may be little you can do about problems that cause stress. However, techniques such as long walks, meditation, and exercise can help you manage it.   Start Now! - Make changes one at a time and set reasonable goals to increase your likelihood of success.        Bad carbs also include fruit juice, alcohol, and sweet tea. These are empty calories that do not signal to your brain that you are full.   Please remember the good carbs are still carbs which convert into sugar. So please measure them out no more than 1/2-1 cup of rice, oatmeal, pasta, and beans  Veggies are however free foods! Pile them on.   Not all fruit is created equal.  Please see the list below, the fruit at the bottom is higher in sugars than the fruit at the top. Please avoid all dried fruits.       When it comes to diets, agreement about the perfect plan isn't easy to find, even among the experts. Experts at the [employer] developed an idea known as the Healthy Eating Plate. Just imagine a plate divided into logical, healthy portions.  The emphasis is on diet quality:  Load up on vegetables and fruits - one-half of your plate: Aim for color and variety, and remember that potatoes don't count.  Go for whole grains - one-quarter of your plate: Whole wheat, barley, wheat berries, quinoa, oats, brown rice, and foods made with them. If you want pasta, go with whole wheat pasta.  Protein power - one-quarter of your plate: Fish, chicken, beans, and nuts are all healthy, versatile protein sources. Limit red meat.  The diet, however, does go beyond the plate, offering a few other suggestions.  Use healthy plant oils, such as olive, canola, soy, corn, sunflower and peanut. Check the labels, and avoid partially hydrogenated oil, which have unhealthy trans fats.  If you're thirsty, drink water. Coffee and tea are  good in moderation, but skip sugary drinks and limit milk and dairy products to one or two daily servings.  The type of carbohydrate in the diet is more important than the amount. Some sources of carbohydrates, such as vegetables, fruits, whole grains, and beans-are healthier than others.  Finally, stay active.   Drink 1/2 your body weight in fluid ounces of water daily; drink a tall glass of water 30 min before meals  Don't eat until you're stuffed- listen to your stomach and eat until you are 80% full   Try eating off of a salad plate; wait 10 min after finishing before going back for seconds  Start by eating the vegetables on your plate; aim for 50% of your meals to be fruits or vegetables  Then eat your protein - lean meats (grass fed if possible), fish, beans, nuts in moderation  Eat your carbs/starch last ONLY if you still are hungry. If you can, stop before finishing it all  Avoid sugar and flour - the closer it looks to it's original form in nature, typically the better it is for you  Splurge in moderation - "assign" days when you get to splurge and have the "bad stuff" - I like to follow a 80% - 20% plan- "good" choices 80 % of the time, "bad" choices in moderation 20% of the time  Simple equation is: Calories out > calories in = weight loss - even if you eat the bad stuff, if you limit portions, you will still lose weight

## 2018-12-11 LAB — COMPLETE METABOLIC PANEL WITH GFR
AG Ratio: 1.5 (calc) (ref 1.0–2.5)
ALT: 15 U/L (ref 9–46)
AST: 14 U/L (ref 10–35)
Albumin: 4.2 g/dL (ref 3.6–5.1)
Alkaline phosphatase (APISO): 83 U/L (ref 35–144)
BUN: 17 mg/dL (ref 7–25)
CO2: 26 mmol/L (ref 20–32)
CREATININE: 0.94 mg/dL (ref 0.70–1.25)
Calcium: 9.4 mg/dL (ref 8.6–10.3)
Chloride: 101 mmol/L (ref 98–110)
GFR, EST AFRICAN AMERICAN: 102 mL/min/{1.73_m2} (ref >=60)
GFR, EST NON AFRICAN AMERICAN: 88 mL/min/{1.73_m2} (ref >=60)
Globulin: 2.8 g/dL (calc) (ref 1.9–3.7)
Glucose, Bld: 201 mg/dL — ABNORMAL HIGH (ref 65–99)
Potassium: 4.5 mmol/L (ref 3.5–5.3)
Sodium: 136 mmol/L (ref 135–146)
Total Bilirubin: 0.3 mg/dL (ref 0.2–1.2)
Total Protein: 7 g/dL (ref 6.1–8.1)

## 2018-12-11 LAB — CBC WITH DIFFERENTIAL/PLATELET
Absolute Monocytes: 678 cells/uL (ref 200–950)
BASOS PCT: 0.3 %
Basophils Absolute: 26 cells/uL (ref 0–200)
Eosinophils Absolute: 79 cells/uL (ref 15–500)
Eosinophils Relative: 0.9 %
HCT: 43.4 % (ref 38.5–50.0)
Hemoglobin: 14.9 g/dL (ref 13.2–17.1)
LYMPHS ABS: 3010 {cells}/uL (ref 850–3900)
MCH: 29.4 pg (ref 27.0–33.0)
MCHC: 34.3 g/dL (ref 32.0–36.0)
MCV: 85.8 fL (ref 80.0–100.0)
MPV: 9.9 fL (ref 7.5–12.5)
Monocytes Relative: 7.7 %
Neutro Abs: 5007 cells/uL (ref 1500–7800)
Neutrophils Relative %: 56.9 %
Platelets: 194 10*3/uL (ref 140–400)
RBC: 5.06 10*6/uL (ref 4.20–5.80)
RDW: 12.5 % (ref 11.0–15.0)
Total Lymphocyte: 34.2 %
WBC: 8.8 10*3/uL (ref 3.8–10.8)

## 2018-12-11 LAB — URINALYSIS, ROUTINE W REFLEX MICROSCOPIC
Bilirubin Urine: NEGATIVE
Hgb urine dipstick: NEGATIVE
Leukocytes,Ua: NEGATIVE
Nitrite: NEGATIVE
Protein, ur: NEGATIVE
SPECIFIC GRAVITY, URINE: 1.031 (ref 1.001–1.03)
pH: 5.5 (ref 5.0–8.0)

## 2018-12-11 LAB — HEMOGLOBIN A1C
Hgb A1c MFr Bld: 9.7 % of total Hgb — ABNORMAL HIGH (ref <5.7)
Mean Plasma Glucose: 232 (calc)
eAG (mmol/L): 12.8 (calc)

## 2018-12-11 LAB — LIPID PANEL
Cholesterol: 142 mg/dL (ref <200)
HDL: 33 mg/dL — ABNORMAL LOW (ref >=40)
LDL CHOLESTEROL (CALC): 80 mg/dL
NON-HDL CHOLESTEROL (CALC): 109 mg/dL (ref <130)
Total CHOL/HDL Ratio: 4.3 (calc) (ref <5.0)
Triglycerides: 191 mg/dL — ABNORMAL HIGH (ref <150)

## 2018-12-11 LAB — MICROALBUMIN / CREATININE URINE RATIO
Creatinine, Urine: 183 mg/dL (ref 20–320)
Microalb Creat Ratio: 5 mcg/mg creat (ref <30)
Microalb, Ur: 1 mg/dL

## 2018-12-11 LAB — TESTOSTERONE: Testosterone: 226 ng/dL — ABNORMAL LOW (ref 250–827)

## 2018-12-11 LAB — MAGNESIUM: Magnesium: 1.8 mg/dL (ref 1.5–2.5)

## 2018-12-11 LAB — TSH: TSH: 2.23 mIU/L (ref 0.40–4.50)

## 2018-12-19 NOTE — Telephone Encounter (Signed)
error 

## 2019-01-20 ENCOUNTER — Other Ambulatory Visit: Payer: Self-pay | Admitting: Adult Health

## 2019-01-20 DIAGNOSIS — I10 Essential (primary) hypertension: Secondary | ICD-10-CM

## 2019-02-02 ENCOUNTER — Other Ambulatory Visit: Payer: Self-pay | Admitting: Internal Medicine

## 2019-02-04 ENCOUNTER — Other Ambulatory Visit: Payer: Self-pay | Admitting: Internal Medicine

## 2019-02-04 DIAGNOSIS — N521 Erectile dysfunction due to diseases classified elsewhere: Principal | ICD-10-CM

## 2019-02-04 DIAGNOSIS — E1169 Type 2 diabetes mellitus with other specified complication: Secondary | ICD-10-CM

## 2019-04-09 ENCOUNTER — Ambulatory Visit: Payer: Self-pay | Admitting: Physician Assistant

## 2019-04-16 ENCOUNTER — Other Ambulatory Visit: Payer: Self-pay | Admitting: Physician Assistant

## 2019-04-16 DIAGNOSIS — I10 Essential (primary) hypertension: Secondary | ICD-10-CM

## 2019-04-21 ENCOUNTER — Other Ambulatory Visit: Payer: Self-pay | Admitting: Physician Assistant

## 2019-04-27 ENCOUNTER — Other Ambulatory Visit: Payer: Self-pay | Admitting: Internal Medicine

## 2019-04-27 ENCOUNTER — Other Ambulatory Visit: Payer: Self-pay | Admitting: Physician Assistant

## 2019-04-27 ENCOUNTER — Other Ambulatory Visit: Payer: Self-pay | Admitting: Adult Health

## 2019-04-27 DIAGNOSIS — E782 Mixed hyperlipidemia: Secondary | ICD-10-CM

## 2019-04-27 DIAGNOSIS — E1169 Type 2 diabetes mellitus with other specified complication: Secondary | ICD-10-CM

## 2019-04-27 DIAGNOSIS — E119 Type 2 diabetes mellitus without complications: Secondary | ICD-10-CM

## 2019-04-27 DIAGNOSIS — E785 Hyperlipidemia, unspecified: Secondary | ICD-10-CM

## 2019-05-05 NOTE — Progress Notes (Signed)
Assessment and Plan:  Essential hypertension - continue medications, DASH diet, exercise and monitor at home. Call if greater than 130/80.  - CBC with Differential/Platelet - BASIC METABOLIC PANEL WITH GFR - Hepatic function panel - TSH  Diabetes mellitus without complication ([place]) Discussed general issues about diabetes pathophysiology and management., Educational material distributed., Suggested low cholesterol diet., Encouraged aerobic exercise., Discussed foot care., Reminded to get yearly retinal exam. - discussed hypoglycemia, start to check sugars - Hemoglobin A1c   Hyperlipidemia -continue medications, check lipids, decrease fatty foods, increase activity.  - Lipid panel  Morbid obesity, unspecified obesity type ([place]) Obesity with co morbidities- long discussion about weight loss, diet, and exercise - Lipid panel - Hemoglobin A1c   Vitamin D deficiency - VITAMIN D 25 Hydroxy (Vit-D Deficiency, Fractures)  Medication management - Magnesium  Chronic obstructive pulmonary disease, unspecified COPD type ([place]) He on breo, uses proair 3 x a day on hot days States that only proair brand name works for him- will try to get this approved Will give samples of trelegy, IF WE HAD THEM, SO HE WAS GIVEN ANORO TO ADD ON TO BREO SHORT TERM, as step up to see if this helps through the summer- WILL SEND IN TRELEGY IF IT HELPS   Continue diet and meds as discussed. Further disposition pending results of labs. Discussed med's effects and SE's.  Future Appointments  Date Time Provider [place]  12/17/2019  3:00 PM Luis Mutters, PA-C [facility] None     HPI 63 y.o. male  presents for 3 month follow up with hypertension, hyperlipidemia, diabetes and vitamin D. His blood pressure has not been controlled at home he does not check it at home,  today their BP is BP: 128/88  He does not workout. He denies chest pain, shortness of breath, dizziness.  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 States that only proair brand name works for him.   BMI is Body mass index is 37.12 kg/m., he is working on diet and exercise. Wt Readings from Last 3 Encounters:  05/07/19 255 lb (115.7 kg)  12/10/18 263 lb 3.2 oz (119.4 kg)  07/17/18 258 lb (117 kg)   He is on cholesterol medication and denies myalgias. His cholesterol is not at goal of 70l. The cholesterol last visit was:   Lab Results  Component Value Date   CHOL 142 12/10/2018   HDL 33 (L) 12/10/2018   LDLCALC 80 12/10/2018   TRIG 191 (H) 12/10/2018   CHOLHDL 4.3 12/10/2018   He has been working on diet and exercise for Diabetes with CKD,  he is on 2 MF a day and he takes 1/2 glimepiride morning and night,  does not check sugars, and denies polydipsia, polyuria and visual disturbances. Last A1C in the office was:  Lab Results  Component Value Date   HGBA1C 9.7 (H) 12/10/2018   Lab Results  Component Value Date   GFRNONAA 88 12/10/2018   Patient is on Vitamin D supplement, 10,000 daily. Lab Results  Component Value Date   VD25OH 23 (L) 04/10/2018       Current Medications:  Current Outpatient Medications on File Prior to Visit  Medication Sig Dispense Refill  . BREO ELLIPTA 100-25 MCG/INH AEPB INHALE 1 PUFF INTO THE LUNGS DAILY. RINSE MOUTH WITH WATER AFTER EACH USE 60 each 2  . Cholecalciferol (VITAMIN D PO) Take 5,000 Int'l Units by mouth daily.    Marland Kitchen  fluticasone (FLONASE) 50 MCG/ACT nasal spray USE 1 SPRAY(S) INTRANASALLY ONCE A DAY AS NEEDED  0  . glimepiride (AMARYL) 4 MG tablet Take 1/2 to 1 tablet 2 x /day with Meals for Diabetes 180 tablet 1  . glucose blood test strip 1 each by Other route as needed for other. Test Blood Sugar Once daily 100 each PRN  . losartan (COZAAR) 50 MG tablet TAKE 1 TABLET BY MOUTH EVERY DAY 90 tablet 0  . lovastatin (MEVACOR) 20 MG tablet Take 1 tablet at Bedtime for Cholesterol 90 tablet 1  . metFORMIN  (GLUCOPHAGE-XR) 500 MG 24 hr tablet Take 2 tablets 2 x /day with meals for Diabetes 360 tablet 3  . PROAIR HFA 108 (90 Base) MCG/ACT inhaler INHALE 2 PUFFS INTO THE LUNGS EVERY 6 HOURS AS NEEDED FOR WHEEZING OR SHORTNESS OF BREATH. 25.5 g 1  . tadalafil (ADCIRCA/CIALIS) 20 MG tablet Take 1/2 to 1 tablet every 2 to 3 days if needed for XXXX 6 tablet 3   No current facility-administered medications on file prior to visit.    Medical History:  Past Medical History:  Diagnosis Date  . Asthma   . Erectile dysfunction   . Hyperlipidemia   . Hypertension   . Other testicular hypofunction   . Type II or unspecified type diabetes mellitus without mention of complication, not stated as uncontrolled    Allergies:  Allergies  Allergen Reactions  . Flax [Bio-Flax]     Review of Systems  Constitutional: Negative.   HENT: Negative.   Eyes: Negative.   Respiratory: Negative.   Cardiovascular: Negative.   Gastrointestinal: Negative.   Genitourinary: Negative.   Musculoskeletal: Positive for back pain. Negative for falls, joint pain, myalgias and neck pain.  Skin: Negative.   Neurological: Negative.   Endo/Heme/Allergies: Negative.   Psychiatric/Behavioral: Negative.      Family history- Review and unchanged Social history- Review and unchanged Physical Exam: BP 128/88   Pulse 84   Temp (!) 97.5 F (36.4 C)   Ht 5' 9.5" (1.765 m)   Wt 255 lb (115.7 kg)   SpO2 97%   BMI 37.12 kg/m  Wt Readings from Last 3 Encounters:  05/07/19 255 lb (115.7 kg)  12/10/18 263 lb 3.2 oz (119.4 kg)  07/17/18 258 lb (117 kg)   General Appearance: Well nourished, in no apparent distress. Eyes: PERRLA, EOMs, conjunctiva no swelling or erythema Sinuses: No Frontal/maxillary tenderness ENT/Mouth: Ext aud canals clear, TMs without erythema, bulging. No erythema, swelling, or exudate on post pharynx.  Tonsils not swollen or erythematous. Hearing normal.  Neck: Supple, thyroid normal.  Respiratory:  Respiratory effort normal, BS equal bilaterally without rales, rhonchi, wheezing or stridor.  Cardio: RRR with no MRGs. Brisk peripheral pulses without edema.  Abdomen: Soft, + BS.  Non tender, no guarding, rebound, hernias, masses. Lymphatics: Non tender without lymphadenopathy.  Musculoskeletal: Full ROM, 5/5 strength, normal gait.  Skin: Warm, dry without rashes, lesions, ecchymosis.  Neuro: Cranial nerves intact. No cerebellar symptoms. Sensation intact.  Psych: Awake and oriented X 3, normal affect, Insight and Judgment appropriate.    [place] 4:13 PM

## 2019-05-07 ENCOUNTER — Other Ambulatory Visit: Payer: Self-pay

## 2019-05-07 ENCOUNTER — Encounter: Payer: Self-pay | Admitting: Physician Assistant

## 2019-05-07 ENCOUNTER — Ambulatory Visit: Payer: 59 | Admitting: Physician Assistant

## 2019-05-07 VITALS — BP 128/88 | HR 84 | Temp 97.5°F | Ht 69.5 in | Wt 255.0 lb

## 2019-05-07 DIAGNOSIS — I10 Essential (primary) hypertension: Secondary | ICD-10-CM

## 2019-05-07 DIAGNOSIS — E1122 Type 2 diabetes mellitus with diabetic chronic kidney disease: Secondary | ICD-10-CM

## 2019-05-07 DIAGNOSIS — N182 Chronic kidney disease, stage 2 (mild): Secondary | ICD-10-CM

## 2019-05-07 DIAGNOSIS — E1169 Type 2 diabetes mellitus with other specified complication: Secondary | ICD-10-CM

## 2019-05-07 DIAGNOSIS — N521 Erectile dysfunction due to diseases classified elsewhere: Secondary | ICD-10-CM

## 2019-05-07 DIAGNOSIS — J449 Chronic obstructive pulmonary disease, unspecified: Secondary | ICD-10-CM | POA: Diagnosis not present

## 2019-05-07 DIAGNOSIS — E559 Vitamin D deficiency, unspecified: Secondary | ICD-10-CM

## 2019-05-07 DIAGNOSIS — E782 Mixed hyperlipidemia: Secondary | ICD-10-CM

## 2019-05-07 DIAGNOSIS — Z79899 Other long term (current) drug therapy: Secondary | ICD-10-CM

## 2019-05-07 DIAGNOSIS — F17200 Nicotine dependence, unspecified, uncomplicated: Secondary | ICD-10-CM

## 2019-05-07 NOTE — Patient Instructions (Signed)
I want to challenge you to lose 10 lbs before the next appointment.  That would be about 3 lbs a month!  This is very achievable and I know you can do it.  10 lbs is actually 40 lbs of pressure of your joints, back, hips, knees and ankles.  10lbs may be enough to help your blood pressure and cholesterol for next visit.      When it comes to diets, agreement about the perfect plan isn't easy to find, even among the experts. Experts at the [employer] developed an idea known as the Healthy Eating Plate. Just imagine a plate divided into logical, healthy portions.  The emphasis is on diet quality:  Load up on vegetables and fruits - one-half of your plate: Aim for color and variety, and remember that potatoes don't count.  Go for whole grains - one-quarter of your plate: Whole wheat, barley, wheat berries, quinoa, oats, brown rice, and foods made with them. If you want pasta, go with whole wheat pasta.  Protein power - one-quarter of your plate: Fish, chicken, beans, and nuts are all healthy, versatile protein sources. Limit red meat.  The diet, however, does go beyond the plate, offering a few other suggestions.  Use healthy plant oils, such as olive, canola, soy, corn, sunflower and peanut. Check the labels, and avoid partially hydrogenated oil, which have unhealthy trans fats.  If you're thirsty, drink water. Coffee and tea are good in moderation, but skip sugary drinks and limit milk and dairy products to one or two daily servings.  The type of carbohydrate in the diet is more important than the amount. Some sources of carbohydrates, such as vegetables, fruits, whole grains, and beans-are healthier than others.  Finally, stay active.

## 2019-05-08 LAB — HEMOGLOBIN A1C
Hgb A1c MFr Bld: 8.5 % of total Hgb — ABNORMAL HIGH (ref <5.7)
Mean Plasma Glucose: 197 (calc)
eAG (mmol/L): 10.9 (calc)

## 2019-05-08 LAB — COMPLETE METABOLIC PANEL WITH GFR
AG Ratio: 1.7 (calc) (ref 1.0–2.5)
ALT: 12 U/L (ref 9–46)
AST: 13 U/L (ref 10–35)
Albumin: 4.2 g/dL (ref 3.6–5.1)
Alkaline phosphatase (APISO): 66 U/L (ref 35–144)
BUN: 14 mg/dL (ref 7–25)
CO2: 28 mmol/L (ref 20–32)
Calcium: 9.5 mg/dL (ref 8.6–10.3)
Chloride: 105 mmol/L (ref 98–110)
Creat: 0.93 mg/dL (ref 0.70–1.25)
GFR, Est African American: 102 mL/min/{1.73_m2} (ref >=60)
GFR, Est Non African American: 88 mL/min/{1.73_m2} (ref >=60)
Globulin: 2.5 g/dL (calc) (ref 1.9–3.7)
Glucose, Bld: 70 mg/dL (ref 65–99)
Potassium: 4.3 mmol/L (ref 3.5–5.3)
Sodium: 138 mmol/L (ref 135–146)
Total Bilirubin: 0.5 mg/dL (ref 0.2–1.2)
Total Protein: 6.7 g/dL (ref 6.1–8.1)

## 2019-05-08 LAB — CBC WITH DIFFERENTIAL/PLATELET
Absolute Monocytes: 730 cells/uL (ref 200–950)
Basophils Absolute: 29 cells/uL (ref 0–200)
Basophils Relative: 0.3 %
Eosinophils Absolute: 115 cells/uL (ref 15–500)
Eosinophils Relative: 1.2 %
HCT: 43.7 % (ref 38.5–50.0)
Hemoglobin: 14.7 g/dL (ref 13.2–17.1)
Lymphs Abs: 3072 cells/uL (ref 850–3900)
MCH: 29.3 pg (ref 27.0–33.0)
MCHC: 33.6 g/dL (ref 32.0–36.0)
MCV: 87.2 fL (ref 80.0–100.0)
MPV: 10 fL (ref 7.5–12.5)
Monocytes Relative: 7.6 %
Neutro Abs: 5654 cells/uL (ref 1500–7800)
Neutrophils Relative %: 58.9 %
Platelets: 243 10*3/uL (ref 140–400)
RBC: 5.01 10*6/uL (ref 4.20–5.80)
RDW: 13 % (ref 11.0–15.0)
Total Lymphocyte: 32 %
WBC: 9.6 10*3/uL (ref 3.8–10.8)

## 2019-05-08 LAB — LIPID PANEL
Cholesterol: 139 mg/dL (ref <200)
HDL: 30 mg/dL — ABNORMAL LOW (ref >=40)
LDL Cholesterol (Calc): 85 mg/dL (calc)
Non-HDL Cholesterol (Calc): 109 mg/dL (calc) (ref <130)
Total CHOL/HDL Ratio: 4.6 (calc) (ref <5.0)
Triglycerides: 138 mg/dL (ref <150)

## 2019-05-08 LAB — TSH: TSH: 3.66 mIU/L (ref 0.40–4.50)

## 2019-05-08 LAB — MAGNESIUM: Magnesium: 1.8 mg/dL (ref 1.5–2.5)

## 2019-05-08 LAB — VITAMIN D 25 HYDROXY (VIT D DEFICIENCY, FRACTURES): Vit D, 25-Hydroxy: 20 ng/mL — ABNORMAL LOW (ref 30–100)

## 2019-06-09 ENCOUNTER — Other Ambulatory Visit: Payer: Self-pay | Admitting: Internal Medicine

## 2019-06-09 DIAGNOSIS — E1169 Type 2 diabetes mellitus with other specified complication: Secondary | ICD-10-CM

## 2019-07-12 ENCOUNTER — Other Ambulatory Visit: Payer: Self-pay | Admitting: Physician Assistant

## 2019-07-12 DIAGNOSIS — I10 Essential (primary) hypertension: Secondary | ICD-10-CM

## 2019-07-24 ENCOUNTER — Other Ambulatory Visit: Payer: Self-pay | Admitting: Internal Medicine

## 2019-08-06 ENCOUNTER — Ambulatory Visit: Payer: Self-pay | Admitting: Physician Assistant

## 2019-09-01 DIAGNOSIS — E785 Hyperlipidemia, unspecified: Secondary | ICD-10-CM | POA: Insufficient documentation

## 2019-09-01 DIAGNOSIS — E1169 Type 2 diabetes mellitus with other specified complication: Secondary | ICD-10-CM | POA: Insufficient documentation

## 2019-09-01 NOTE — Progress Notes (Addendum)
Assessment and Plan:  Smoker Declines chantix due to DOT Declines imaging at this time due to holidays, will discuss in 3 months, he does qualify for low dose CT screening.   Essential hypertension - continue medications, DASH diet, exercise and monitor at home. Call if greater than 130/80.  - CBC with Differential/Platelet - BASIC METABOLIC PANEL WITH GFR - Hepatic function panel - TSH  Diabetes mellitus without complication ([place]) Discussed general issues about diabetes pathophysiology and management., Educational material distributed., Suggested low cholesterol diet., Encouraged aerobic exercise., Discussed foot care., Reminded to get yearly retinal exam- scheduled in Jan - Hemoglobin A1c   Hyperlipidemia -continue medications, check lipids, decrease fatty foods, increase activity. - On lovastatin 20mg  may need to add on zetia to recheck goal of 70  - Lipid panel  Morbid obesity, unspecified obesity type ([place]) Obesity with co morbidities- long discussion about weight loss, diet, and exercise - Lipid panel - Hemoglobin A1c - get on B12   Vitamin D deficiency - VITAMIN D 25 Hydroxy (Vit-D Deficiency, Fractures)  Medication management - Magnesium  Chronic obstructive pulmonary disease, unspecified COPD type ([place]) He on breo, uses proair 3 States that only proair brand name works for him Was given trelegy samples that he did well with but just got 3 months of breo, will try to do 3 months of anoro and when he is done will send in trelegy.   Right hand pain With swelling/stiffness at MCP Celebrex sent in for OA Will get Autoimmune labs to rule out RA with distribution Weakly positive RF, will get xray and refer to rheumatology.to rule out RA  Continue diet and meds as discussed. Further disposition pending results of labs. Discussed med's effects and SE's.  Future Appointments  Date Time Provider [place]  12/17/2019  3:00 PM Vicie Mutters, PA-C [facility] None      HPI 63 y.o. male  presents for 3 month follow up with hypertension, hyperlipidemia, diabetes and vitamin D.  He has right handed MCP pain, has been for years, getting worse, no catching or clicking, some pain in left hand, + swelling and stiffness, no family history of autoimmune.   His blood pressure has not been controlled at home he does not check it at home,  today their BP is BP: 138/70  He does not workout. He denies chest pain, shortness of breath, dizziness.   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 States that only proair brand name works for him. Last CXR 2009.  We discussed low dose CT scan, he wants to get through the holidays and discuss next year.   BMI is Body mass index is 38.4 kg/m., he is working on diet and exercise. Wt Readings from Last 3 Encounters:  09/02/19 263 lb 12.8 oz (119.7 kg)  05/07/19 255 lb (115.7 kg)  12/10/18 263 lb 3.2 oz (119.4 kg)   He has been working on diet and exercise for Diabetes  with CKD he is on ARB With hyperlipidemia not at goal less than 70, on lovastatin On 2 MF a day 1/2 glimepiride morning and night does not check sugars Eye Exam: missed this year due to [place], has appointment Nov 04 2019 denies polydipsia, polyuria and visual disturbances.  Last A1C in the office was:  Lab Results  Component Value Date   HGBA1C 8.5 (H) 05/07/2019   Lab Results  Component Value  Date   CHOL 139 05/07/2019   HDL 30 (L) 05/07/2019   LDLCALC 85 05/07/2019   TRIG 138 05/07/2019   CHOLHDL 4.6 05/07/2019   Lab Results  Component Value Date   GFRNONAA 88 05/07/2019   Patient is on Vitamin D supplement, 10,000 daily. Lab Results  Component Value Date   VD25OH 20 (L) 05/07/2019       Current Medications:   Current Outpatient Medications (Endocrine & Metabolic):  .  glimepiride (AMARYL) 4 MG tablet, Take 1/2 to 1 tablet 2 x /day  with Meals for Diabetes .  metFORMIN (GLUCOPHAGE-XR) 500 MG 24 hr tablet, Take 2 tablets 2 x /day with meals for Diabetes  Current Outpatient Medications (Cardiovascular):  .  losartan (COZAAR) 50 MG tablet, Take 1 tablet Daily for BP .  lovastatin (MEVACOR) 20 MG tablet, Take 1 tablet at Bedtime for Cholesterol .  tadalafil (CIALIS) 20 MG tablet, TAKE 1/2 TO 1 TABLET EVERY 2 TO 3 DAYS IF NEEDED FOR XXXX  Current Outpatient Medications (Respiratory):  .  fluticasone (FLONASE) 50 MCG/ACT nasal spray, USE 1 SPRAY(S) INTRANASALLY ONCE A DAY AS NEEDED .  fluticasone furoate-vilanterol (BREO ELLIPTA) 100-25 MCG/INH AEPB, Use 1 inhalation Daily .  PROAIR HFA 108 (90 Base) MCG/ACT inhaler, INHALE 2 PUFFS INTO THE LUNGS EVERY 6 HOURS AS NEEDED FOR WHEEZING OR SHORTNESS OF BREATH.    Current Outpatient Medications (Other):  Marland Kitchen  Cholecalciferol (VITAMIN D PO), Take 5,000 Int'l Units by mouth daily. Marland Kitchen  glucose blood test strip, 1 each by Other route as needed for other. Test Blood Sugar Once daily  Medical History:  Past Medical History:  Diagnosis Date  . Asthma   . Erectile dysfunction   . Hyperlipidemia   . Hypertension   . Other testicular hypofunction   . Type II or unspecified type diabetes mellitus without mention of complication, not stated as uncontrolled    Allergies:  Allergies  Allergen Reactions  . Flax [Bio-Flax]     Review of Systems  Constitutional: Negative.   HENT: Negative.   Eyes: Negative.   Respiratory: Negative.   Cardiovascular: Negative.   Gastrointestinal: Negative.   Genitourinary: Negative.   Musculoskeletal: Positive for back pain. Negative for falls, joint pain, myalgias and neck pain.  Skin: Negative.   Neurological: Negative.   Endo/Heme/Allergies: Negative.   Psychiatric/Behavioral: Negative.      Family history- Review and unchanged Social history- Review and unchanged Physical Exam: BP 138/70   Pulse 76   Temp (!) 97.2 F (36.2 C)   Wt  263 lb 12.8 oz (119.7 kg)   SpO2 95%   BMI 38.40 kg/m  Wt Readings from Last 3 Encounters:  09/02/19 263 lb 12.8 oz (119.7 kg)  05/07/19 255 lb (115.7 kg)  12/10/18 263 lb 3.2 oz (119.4 kg)   General Appearance: Well nourished, in no apparent distress. Eyes: PERRLA, EOMs, conjunctiva no swelling or erythema Sinuses: No Frontal/maxillary tenderness ENT/Mouth: Ext aud canals clear, TMs without erythema, bulging. No erythema, swelling, or exudate on post pharynx.  Tonsils not swollen or erythematous. Hearing normal.  Neck: Supple, thyroid normal.  Respiratory: Respiratory effort normal, BS equal bilaterally without rales, rhonchi, wheezing or stridor.  Cardio: RRR with no MRGs. Brisk peripheral pulses without edema.  Abdomen: Soft, + BS.  Non tender, no guarding, rebound, hernias, masses. Lymphatics: Non tender without lymphadenopathy.  Musculoskeletal: Full ROM, 5/5 strength, normal gait.  Skin: Warm, dry without rashes, lesions, ecchymosis.  Neuro: Cranial nerves intact. No cerebellar  symptoms. Sensation intact.  Psych: Awake and oriented X 3, normal affect, Insight and Judgment appropriate.    Vicie Mutters 10:39 AM

## 2019-09-02 ENCOUNTER — Encounter: Payer: Self-pay | Admitting: Physician Assistant

## 2019-09-02 ENCOUNTER — Other Ambulatory Visit: Payer: Self-pay

## 2019-09-02 ENCOUNTER — Ambulatory Visit: Payer: 59 | Admitting: Physician Assistant

## 2019-09-02 VITALS — BP 138/70 | HR 76 | Temp 97.2°F | Wt 263.8 lb

## 2019-09-02 DIAGNOSIS — J449 Chronic obstructive pulmonary disease, unspecified: Secondary | ICD-10-CM | POA: Diagnosis not present

## 2019-09-02 DIAGNOSIS — E785 Hyperlipidemia, unspecified: Secondary | ICD-10-CM

## 2019-09-02 DIAGNOSIS — E1122 Type 2 diabetes mellitus with diabetic chronic kidney disease: Secondary | ICD-10-CM | POA: Diagnosis not present

## 2019-09-02 DIAGNOSIS — I10 Essential (primary) hypertension: Secondary | ICD-10-CM | POA: Diagnosis not present

## 2019-09-02 DIAGNOSIS — Z23 Encounter for immunization: Secondary | ICD-10-CM

## 2019-09-02 DIAGNOSIS — N521 Erectile dysfunction due to diseases classified elsewhere: Secondary | ICD-10-CM

## 2019-09-02 DIAGNOSIS — E559 Vitamin D deficiency, unspecified: Secondary | ICD-10-CM

## 2019-09-02 DIAGNOSIS — F17200 Nicotine dependence, unspecified, uncomplicated: Secondary | ICD-10-CM

## 2019-09-02 DIAGNOSIS — E1169 Type 2 diabetes mellitus with other specified complication: Secondary | ICD-10-CM

## 2019-09-02 DIAGNOSIS — M79641 Pain in right hand: Secondary | ICD-10-CM

## 2019-09-02 DIAGNOSIS — N182 Chronic kidney disease, stage 2 (mild): Secondary | ICD-10-CM

## 2019-09-02 DIAGNOSIS — Z79899 Other long term (current) drug therapy: Secondary | ICD-10-CM

## 2019-09-02 DIAGNOSIS — E782 Mixed hyperlipidemia: Secondary | ICD-10-CM

## 2019-09-02 MED ORDER — CELECOXIB 200 MG PO CAPS: ORAL_CAPSULE | ORAL | 2 refills | Status: DC

## 2019-09-02 MED ORDER — ANORO ELLIPTA 62.5-25 MCG/INH IN AEPB: 1.0000 | INHALATION_SPRAY | Freq: Every day | RESPIRATORY_TRACT | 2 refills | Status: DC

## 2019-09-02 MED ORDER — TRELEGY ELLIPTA 100-62.5-25 MCG/INH IN AEPB: 100.0000 ug | INHALATION_SPRAY | Freq: Every day | RESPIRATORY_TRACT | 0 refills | Status: DC

## 2019-09-02 NOTE — Patient Instructions (Addendum)
Get on B complex!!    Diet soda leads to weight gain.  We recently discovered that the artifical sugar in the soda stops an enzyme in your stomach that is suppose to signal that your brain is full. So patients that drink a lot of diet soda will never feel full and tend to over eat. So please cut back on diet soda and it can help with your weight loss.   A study found that two or more sodas a day increased the risk of hypertension, diabetes, stroke.   Another recent study found that higher consumption of soda and juice was associated with increased risk of cancer and breast cancer.   In animal studies artifical sweeteners affect the gut bacteria and may contribute to chronic disease this way.   General eating tips  What to Avoid . Avoid added sugars o Often added sugar can be found in processed foods such as many condiments, dry cereals, cakes, cookies, chips, crisps, crackers, candies, sweetened drinks, etc.  o Read labels and AVOID/DECREASE use of foods with the following in their ingredient list: Sugar, fructose, high fructose corn syrup, sucrose, glucose, maltose, dextrose, molasses, cane sugar, brown sugar, any type of syrup, agave nectar, etc.   . Avoid snacking in between meals- drink water or if you feel you need a snack, pick a high water content snack such as cucumbers, watermelon, or any veggie.  Marland Kitchen Avoid foods made with flour o If you are going to eat food made with flour, choose those made with whole-grains; and, minimize your consumption as much as is tolerable . Avoid processed foods o These foods are generally stocked in the middle of the grocery store.  o Focus on shopping on the perimeter of the grocery.  What to Include . Vegetables o GREEN LEAFY VEGETABLES: Kale, spinach, mustard greens, collard greens, cabbage, broccoli, etc. o OTHER: Asparagus, cauliflower, eggplant, carrots, peas, Brussel sprouts, tomatoes, bell peppers, zucchini, beets, cucumbers, etc. . Grains,  seeds, and legumes o Beans: kidney beans, black eyed peas, garbanzo beans, black beans, pinto beans, etc. o Whole, unrefined grains: brown rice, barley, bulgur, oatmeal, etc. . Healthy fats  o Avoid highly processed fats such as vegetable oil o Examples of healthy fats: avocado, olives, virgin olive oil, dark chocolate (?72% Cocoa), nuts (peanuts, almonds, walnuts, cashews, pecans, etc.) o Please still do small amount of these healthy fats, they are dense in calories.  . Low - Moderate Intake of Animal Sources of Protein o Meat sources: chicken, [place], salmon, tuna. Limit to 4 ounces of meat at one time or the size of your palm. o Consider limiting dairy sources, but when choosing dairy focus on: PLAIN [place] yogurt, cottage cheese, high-protein milk . Fruit o Choose berries

## 2019-09-02 NOTE — Addendum Note (Signed)
Addended by: Elsie Amis D on: 09/02/2019 11:28 AM   Modules accepted: Orders

## 2019-09-03 MED ORDER — EZETIMIBE 10 MG PO TABS: 10.0000 mg | ORAL_TABLET | Freq: Every day | ORAL | 1 refills | Status: DC

## 2019-09-03 NOTE — Addendum Note (Signed)
Addended by: Vicie Mutters R on: 09/03/2019 08:22 AM   Modules accepted: Orders

## 2019-09-04 LAB — CBC WITH DIFFERENTIAL/PLATELET
Absolute Monocytes: 592 cells/uL (ref 200–950)
Basophils Absolute: 26 cells/uL (ref 0–200)
Basophils Relative: 0.3 %
Eosinophils Absolute: 96 cells/uL (ref 15–500)
Eosinophils Relative: 1.1 %
HCT: 42.8 % (ref 38.5–50.0)
Hemoglobin: 14.2 g/dL (ref 13.2–17.1)
Lymphs Abs: 2532 cells/uL (ref 850–3900)
MCH: 29 pg (ref 27.0–33.0)
MCHC: 33.2 g/dL (ref 32.0–36.0)
MCV: 87.5 fL (ref 80.0–100.0)
MPV: 10.3 fL (ref 7.5–12.5)
Monocytes Relative: 6.8 %
Neutro Abs: 5455 cells/uL (ref 1500–7800)
Neutrophils Relative %: 62.7 %
Platelets: 172 10*3/uL (ref 140–400)
RBC: 4.89 10*6/uL (ref 4.20–5.80)
RDW: 12.9 % (ref 11.0–15.0)
Total Lymphocyte: 29.1 %
WBC: 8.7 10*3/uL (ref 3.8–10.8)

## 2019-09-04 LAB — COMPLETE METABOLIC PANEL WITH GFR
AG Ratio: 1.6 (calc) (ref 1.0–2.5)
ALT: 14 U/L (ref 9–46)
AST: 14 U/L (ref 10–35)
Albumin: 4.1 g/dL (ref 3.6–5.1)
Alkaline phosphatase (APISO): 64 U/L (ref 35–144)
BUN: 15 mg/dL (ref 7–25)
CO2: 27 mmol/L (ref 20–32)
Calcium: 9.3 mg/dL (ref 8.6–10.3)
Chloride: 103 mmol/L (ref 98–110)
Creat: 0.82 mg/dL (ref 0.70–1.25)
GFR, Est African American: 111 mL/min/{1.73_m2} (ref >=60)
GFR, Est Non African American: 95 mL/min/{1.73_m2} (ref >=60)
Globulin: 2.5 g/dL (calc) (ref 1.9–3.7)
Glucose, Bld: 150 mg/dL — ABNORMAL HIGH (ref 65–99)
Potassium: 4.5 mmol/L (ref 3.5–5.3)
Sodium: 139 mmol/L (ref 135–146)
Total Bilirubin: 0.5 mg/dL (ref 0.2–1.2)
Total Protein: 6.6 g/dL (ref 6.1–8.1)

## 2019-09-04 LAB — LIPID PANEL
Cholesterol: 138 mg/dL (ref <200)
HDL: 34 mg/dL — ABNORMAL LOW (ref >=40)
LDL Cholesterol (Calc): 82 mg/dL (calc)
Non-HDL Cholesterol (Calc): 104 mg/dL (calc) (ref <130)
Total CHOL/HDL Ratio: 4.1 (calc) (ref <5.0)
Triglycerides: 127 mg/dL (ref <150)

## 2019-09-04 LAB — HEMOGLOBIN A1C
Hgb A1c MFr Bld: 8 % of total Hgb — ABNORMAL HIGH (ref <5.7)
Mean Plasma Glucose: 183 (calc)
eAG (mmol/L): 10.1 (calc)

## 2019-09-04 LAB — TSH: TSH: 2.43 mIU/L (ref 0.40–4.50)

## 2019-09-04 LAB — VITAMIN D 25 HYDROXY (VIT D DEFICIENCY, FRACTURES): Vit D, 25-Hydroxy: 15 ng/mL — ABNORMAL LOW (ref 30–100)

## 2019-09-04 LAB — RHEUMATOID FACTOR: Rheumatoid fact SerPl-aCnc: 16 IU/mL — ABNORMAL HIGH (ref <14)

## 2019-09-04 LAB — ANTI-DNA ANTIBODY, DOUBLE-STRANDED: ds DNA Ab: 4 IU/mL

## 2019-09-04 LAB — ANA: Anti Nuclear Antibody (ANA): NEGATIVE

## 2019-09-04 LAB — MAGNESIUM: Magnesium: 1.6 mg/dL (ref 1.5–2.5)

## 2019-09-18 ENCOUNTER — Other Ambulatory Visit: Payer: Self-pay | Admitting: Physician Assistant

## 2019-11-12 ENCOUNTER — Other Ambulatory Visit: Payer: Self-pay | Admitting: Internal Medicine

## 2019-11-12 DIAGNOSIS — E785 Hyperlipidemia, unspecified: Secondary | ICD-10-CM

## 2019-11-12 DIAGNOSIS — E782 Mixed hyperlipidemia: Secondary | ICD-10-CM

## 2019-12-03 ENCOUNTER — Other Ambulatory Visit: Payer: Self-pay | Admitting: Internal Medicine

## 2019-12-03 DIAGNOSIS — I10 Essential (primary) hypertension: Secondary | ICD-10-CM

## 2019-12-15 ENCOUNTER — Telehealth: Payer: Self-pay

## 2019-12-15 NOTE — Telephone Encounter (Signed)
Patient has indicated that he does not want to complete the COLOGUARD at this time.  Order has expired-----cologuard.----------12/12/2019

## 2019-12-16 NOTE — Progress Notes (Signed)
Complete Physical  Assessment and Plan:  Right hand pain/swelling Patient with right hand swelling and pain x years, unable to make a fist.  Had weakly positive RF, will refer to rhem for evaluation  Essential hypertension - continue medications, DASH diet, exercise and monitor at home. Call if greater than 130/80.  -     CBC with Differential/Platelet -     BASIC METABOLIC PANEL WITH GFR -     Hepatic function panel -     TSH -     Urinalysis, Routine w reflex microscopic -     Microalbumin / creatinine urine ratio -     EKG 12-Lead -     [place], RETROPERITNL ABD,  LTD  Chronic obstructive pulmonary disease, unspecified COPD type ([place]) Advised to stop smoking, continue meds/Breo.  Patient has 100 pack year smoking history Agreed to do low dose CT, no symptoms  Diabetes mellitus with CKD ([place]) Discussed general issues about diabetes pathophysiology and management., Educational material distributed., Suggested low cholesterol diet., Encouraged aerobic exercise., Discussed foot care., Reminded to get yearly retinal exam. 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 -     lovastatin (MEVACOR) 20 MG tablet; Take 1 tablet (20 mg total) by mouth at bedtime. -     Lipid panel - get back on zetia  Morbid obesity ([facility]) - follow up 4 months for progress monitoring - increase veggies, decrease carbs - long discussion about weight loss, diet, and exercise  Erectile dysfunction associated with type 2 diabetes mellitus ([place]) Discussed general issues about diabetes pathophysiology and management., Educational material distributed.,  Suggested low cholesterol diet., Encouraged aerobic exercise., Discussed foot care., Reminded to get yearly retinal exam.  Hypogonadism in male May be interested in the shots, get on zinc, weight loss advised -     Testosterone  Uncomplicated asthma, unspecified asthma severity, unspecified whether persistent Continue breo, weight loss advised Patient needs proair, not ventolin  Tobacco use disorder -     [place], RETROPERITNL ABD,  LTD Smoking cessation-  instruction/counseling given, counseled patient on the dangers of tobacco use, advised patient to stop smoking, and reviewed strategies to maximize success, patient not ready to quit at this time.   Medication management -     Magnesium  Vitamin D deficiency -     VITAMIN D 25 Hydroxy (Vit-D Deficiency, Fractures)  Morbid obesity Discussed diet/exercise, weight management and risk modification  Screening PSA (prostate specific antigen) -     check  Screen colon cancer Declines cologuard VERY long discussion about getting colonoscopy.  Discussed that Colon cancer is 3rd most diagnosed cancer and 2nd leading cause of death in both men and women 47 years of age and older despite being one of the most preventable and treatable cancers if found early. Despite this information patient still declines colonoscopy and understands risk of cancer and death.   Discussed med's effects and SE's. Screening labs and tests as requested with regular follow-up as recommended. Future Appointments  Date Time Provider [place]  12/16/2020  3:00 PM Vicie Mutters, PA-C [facility] None    HPI 64 y.o. male  presents for a  complete physical. His blood pressure has been controlled at home, today their BP is BP: 126/84 He does not workout. He denies chest pain, shortness of breath, dizziness.   BMI is Body mass index is 39.37 kg/m., he is working on diet and exercise. Due to his obesity/low testosterone he has ED and uses cialis PRN.  He states  that his job has switched again to day shift trucks and he is having a hard time switching.  Wt Readings from Last 3 Encounters:  12/17/19 266 lb 9.6 oz (120.9 kg)  09/02/19 263 lb 12.8 oz (119.7 kg)  05/07/19 255 lb (115.7 kg)   He has asthma/COPD, is on breo, did not do well with anoro  STATES HE HAD YEAST WITH THIS, and will occ use albuterol, does not want a new med at this time. Has had ventolin in the past, it is not as affective as proair, patient needs the proair filled.  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 1-2 packs a day for 50 years. Suggest low dose CT, patient will think about it.   Last visit he is complaining of bilateral hand pain, worse in the right hand, has been for years, will not be able to make a fist. He has a weakly positive RF factor, will get Xrays and refer to rheum for evaluation. No other joint pain, no rashes.   He is on cholesterol medication, lovastatin 20mg , was started on zetia last visit but stopped, will restart and denies myalgias. His cholesterol is not at goal, less than 70. The cholesterol last visit was:   Lab Results  Component Value Date   CHOL 138 09/02/2019   HDL 34 (L) 09/02/2019   LDLCALC 82 09/02/2019   TRIG 127 09/02/2019   CHOLHDL 4.1 09/02/2019   He has not been working on diet and exercise for diabetes With hyperlipidemia not at goal denies foot ulcerations, hypoglycemia , paresthesia of the feet, polydipsia, polyuria and visual disturbances.  He is on ACE and on bASA and metformin two pills with supper a day and glimepiride, he is on 1/2mg  BID, has not been checking sugars. Tried invokana but could not tolerate it due to yeast. Last A1C in the office was:  Lab Results  Component Value Date   HGBA1C 8.0 (H) 09/02/2019   Lab Results  Component Value Date   [place]iley 95 09/02/2019   Patient is on Vitamin D supplement, on 5000 IU Lab Results  Component  Value Date   VD25OH 15 (L) 09/02/2019   Lab Results  Component Value Date   PSA 0.3 11/20/2017   PSA 0.23 03/13/2016   PSA 0.22 01/04/2015    Current Medications:  Current Outpatient Medications on File Prior to Visit  Medication Sig Dispense Refill  . Cholecalciferol (VITAMIN D PO) Take 5,000 Int'l Units by mouth daily.    Marland Kitchen ezetimibe (ZETIA) 10 MG tablet Take 1 tablet (10 mg total) by mouth daily. 90 tablet 1  . fluticasone (FLONASE) 50 MCG/ACT nasal spray USE 1 SPRAY(S) INTRANASALLY ONCE A DAY AS NEEDED  0  . Fluticasone-Umeclidin-Vilant (TRELEGY ELLIPTA) 100-62.5-25 MCG/INH AEPB Inhale 100 mcg into the lungs daily. 60 each 0  . glimepiride (AMARYL) 4 MG tablet Take 1/2 to 1 tablet 2 x /day with Meals for Diabetes 180 tablet 1  . glucose blood test strip 1 each by Other route as needed for other. Test Blood  Sugar Once daily 100 each PRN  . losartan (COZAAR) 50 MG tablet Take 1 tablet Daily for BP 90 tablet 1  . lovastatin (MEVACOR) 20 MG tablet TAKE 1 TABLET BY MOUTH AT BEDTIME FOR CHOLESTEROL 90 tablet 1  . metFORMIN (GLUCOPHAGE-XR) 500 MG 24 hr tablet Take 2 tablets 2 x /day with meals for Diabetes 360 tablet 3  . PROAIR HFA 108 (90 Base) MCG/ACT inhaler INHALE 2 PUFFS INTO THE LUNGS EVERY 6 HOURS AS NEEDED FOR WHEEZING OR SHORTNESS OF BREATH. 25.5 g 1  . tadalafil (CIALIS) 20 MG tablet TAKE 1/2 TO 1 TABLET EVERY 2 TO 3 DAYS IF NEEDED FOR XXXX 30 tablet 99  . umeclidinium-vilanterol (ANORO ELLIPTA) 62.5-25 MCG/INH AEPB Inhale 1 puff into the lungs daily. Make sure you rinse your mouth after each use. 1 each 2  . celecoxib (CELEBREX) 200 MG capsule Take 1 pill as needed for pain twice a day with food, can not take with ibuprofen or aleve, can take with tylenol. (Patient not taking: Reported on 12/17/2019) 60 capsule 2   No current facility-administered medications on file prior to visit.   Immunization History  Administered Date(s) Administered  . Influenza Inj Mdck Quad With  Preservative 09/02/2019  . Influenza-Unspecified 08/15/2017  . Tdap 01/04/2015   Health Maintenance:  Tetanus: 2016 Pneumovax: declines Prevnar 13: when 67 Flu vaccine: 2020 AT work Zostavax: declines DEXA:N/A Colonoscopy: declines- long discussion, understands risk declines at this time EGD: N/A  Eye Doctor: 2019 [facility]- had canceled appointment last month but rescheduled due to COVID Dentist:None  Medical History:  Past Medical History:  Diagnosis Date  . Asthma   . Erectile dysfunction   . Hyperlipidemia   . Hypertension   . Other testicular hypofunction   . Type II or unspecified type diabetes mellitus without mention of complication, not stated as uncontrolled    Allergies Allergies  Allergen Reactions  . Flax [Bio-Flax]     SURGICAL HISTORY He  has no past surgical history on file. FAMILY HISTORY His family history includes Cancer in his father; Diabetes in his mother; Hypertension in his mother; Stroke in his mother.  Lung cancer in dad  SOCIAL HISTORY He  reports that he has been smoking. He has been smoking about 2.00 packs per day. He has never used smokeless tobacco. He reports that he does not drink alcohol or use drugs. 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   Review of Systems  Constitutional: Negative.   HENT: Negative.   Eyes: Negative.   Respiratory: Negative.   Cardiovascular: Negative.   Gastrointestinal: Negative.   Genitourinary: Negative.   Musculoskeletal: Positive for joint pain. Negative for back pain, falls, myalgias and neck pain.  Skin: Positive for rash.  Neurological: Negative.   Endo/Heme/Allergies: Negative.   Psychiatric/Behavioral: Negative for depression, hallucinations, memory loss, substance abuse and suicidal ideas. The patient is not nervous/anxious and does not have insomnia.      Physical Exam: Estimated body mass index is 39.37 kg/m as calculated from the following:   Height as of this  encounter: 5\' 9"  (1.753 m).   Weight as of this encounter: 266 lb 9.6 oz (120.9 kg). Vitals:   12/17/19 1505  BP: 126/84  Pulse: 69  Temp: 97.9 F (36.6 C)  SpO2: 95%   General Appearance: Well nourished, in no apparent distress. Eyes: PERRLA, EOMs, conjunctiva no swelling or erythema, normal fundi and vessels. Sinuses: No Frontal/maxillary tenderness ENT/Mouth: Ext aud canals clear, normal  light reflex with TMs without erythema, bulging. Good dentition. No erythema, swelling, or exudate on post pharynx. Tonsils not swollen or erythematous. Hearing normal.  Neck: Supple, thyroid normal. No bruits Respiratory: Respiratory effort normal, BS equal bilaterally with mild expiratory wheeze without rales, rhonchi, or stridor. Cardio: RRR without murmurs, rubs or gallops. Brisk peripheral pulses without edema.  Chest: symmetric, with normal excursions and percussion. Abdomen: Soft, obese +BS. Non tender, no guarding, rebound, hernias, masses, or organomegaly. .  Lymphatics: Non tender without lymphadenopathy.  Genitourinary: defer Musculoskeletal: Full ROM all peripheral extremities,5/5 strength, and normal gait. Skin: Warm, dry without rashes, lesions, ecchymosis.  Neuro: Cranial nerves intact, reflexes equal bilaterally. Normal muscle tone, no cerebellar symptoms. Sensation intact.  Psych: Awake and oriented X 3, normal affect, Insight and Judgment appropriate.   EKG: WNL no changes. Aorta WNL    Vicie Mutters 3:19 PM

## 2019-12-17 ENCOUNTER — Ambulatory Visit: Payer: 59 | Admitting: Physician Assistant

## 2019-12-17 ENCOUNTER — Encounter: Payer: Self-pay | Admitting: Physician Assistant

## 2019-12-17 ENCOUNTER — Other Ambulatory Visit: Payer: Self-pay

## 2019-12-17 VITALS — BP 126/84 | HR 69 | Temp 97.9°F | Ht 69.0 in | Wt 266.6 lb

## 2019-12-17 DIAGNOSIS — E1169 Type 2 diabetes mellitus with other specified complication: Secondary | ICD-10-CM

## 2019-12-17 DIAGNOSIS — E291 Testicular hypofunction: Secondary | ICD-10-CM

## 2019-12-17 DIAGNOSIS — E782 Mixed hyperlipidemia: Secondary | ICD-10-CM

## 2019-12-17 DIAGNOSIS — Z136 Encounter for screening for cardiovascular disorders: Secondary | ICD-10-CM

## 2019-12-17 DIAGNOSIS — I10 Essential (primary) hypertension: Secondary | ICD-10-CM

## 2019-12-17 DIAGNOSIS — Z0000 Encounter for general adult medical examination without abnormal findings: Secondary | ICD-10-CM

## 2019-12-17 DIAGNOSIS — E559 Vitamin D deficiency, unspecified: Secondary | ICD-10-CM

## 2019-12-17 DIAGNOSIS — J449 Chronic obstructive pulmonary disease, unspecified: Secondary | ICD-10-CM

## 2019-12-17 DIAGNOSIS — Z125 Encounter for screening for malignant neoplasm of prostate: Secondary | ICD-10-CM

## 2019-12-17 DIAGNOSIS — E1122 Type 2 diabetes mellitus with diabetic chronic kidney disease: Secondary | ICD-10-CM

## 2019-12-17 DIAGNOSIS — F17200 Nicotine dependence, unspecified, uncomplicated: Secondary | ICD-10-CM

## 2019-12-17 DIAGNOSIS — Z0001 Encounter for general adult medical examination with abnormal findings: Secondary | ICD-10-CM

## 2019-12-17 DIAGNOSIS — Z79899 Other long term (current) drug therapy: Secondary | ICD-10-CM

## 2019-12-17 DIAGNOSIS — J45909 Unspecified asthma, uncomplicated: Secondary | ICD-10-CM

## 2019-12-17 DIAGNOSIS — R768 Other specified abnormal immunological findings in serum: Secondary | ICD-10-CM

## 2019-12-17 DIAGNOSIS — M79641 Pain in right hand: Secondary | ICD-10-CM

## 2019-12-17 DIAGNOSIS — F17210 Nicotine dependence, cigarettes, uncomplicated: Secondary | ICD-10-CM

## 2019-12-17 DIAGNOSIS — N182 Chronic kidney disease, stage 2 (mild): Secondary | ICD-10-CM

## 2019-12-17 MED ORDER — BREO ELLIPTA 100-25 MCG/INH IN AEPB: 1.0000 | INHALATION_SPRAY | Freq: Every day | RESPIRATORY_TRACT | 0 refills | Status: DC

## 2019-12-17 NOTE — Patient Instructions (Addendum)
INFORMATION ABOUT YOUR XRAY  Can walk into [address] for an Insurance account manager. They will have the order and take you back. You do not any paper work, I should get the result back today or tomorrow. This order is good for a year.  Can call [phone] to schedule an appointment if you wish.   Discussed with the patient the nature of GLP-1 drugs, the actions on various organ systems, how they benefit blood glucose control, as well as the benefit of weight loss and increase satiety . Explained possible side effects especially nausea and vomiting initially; discussed safety information in package insert. would be a once a week injection.  Please research ozempic, Bcise, or trulicity.    Will refer to rheumotology We have made a referral for you to get imaging or go to another doctor. We will try to get this as soon as possible but please understand that we often need to get approval with your insurance before we can schedule and not every office can accommodate you quickly. So please allow 5-7 business days for [place] to call you back about the referral.   Will schedule low dose CT scan.   Your ears and sinuses are connected by the eustachian tube. When your sinuses are inflamed, this can close off the tube and cause fluid to collect in your middle ear. This can then cause dizziness, popping, clicking, ringing, and echoing in your ears. This is often NOT an infection and does NOT require antibiotics, it is caused by inflammation so the treatments help the inflammation. This can take a long time to get better so please be patient.  Here are things you can do to help with this: - Try the Flonase or Nasonex. Remember to spray each nostril twice towards the outer part of your eye.  Do not sniff but instead pinch your nose and tilt your head back to help the medicine get into your sinuses.  The best time to do this is at bedtime.Stop if you get blurred vision or nose bleeds.  -While drinking fluids, pinch  and hold nose close and swallow, to help open eustachian tubes to drain fluid behind ear drums. -Please pick one of the over the counter allergy medications below and take it once daily for allergies.  It will also help with fluid behind ear drums. Claritin or loratadine cheapest but likely the weakest  Zyrtec or certizine at night because it can make you sleepy The strongest is allegra or fexafinadine  Cheapest at [facility], [employer], [employer] -can use decongestant over the counter, please do not use if you have high blood pressure or certain heart conditions.   if worsening HA, changes vision/speech, imbalance, weakness go to the ER   General eating tips  What to Avoid . Avoid added sugars o Often added sugar can be found in processed foods such as many condiments, dry cereals, cakes, cookies, chips, crisps, crackers, candies, sweetened drinks, etc.  o Read labels and AVOID/DECREASE use of foods with the following in their ingredient list: Sugar, fructose, high fructose corn syrup, sucrose, glucose, maltose, dextrose, molasses, cane sugar, brown sugar, any type of syrup, agave nectar, etc.   . Avoid snacking in between meals- drink water or if you feel you need a snack, pick a high water content snack such as cucumbers, watermelon, or any veggie.  Marland Kitchen Avoid foods made with flour o If you are going to eat food made with flour, choose those made with whole-grains; and, minimize your  consumption as much as is tolerable . Avoid processed foods o These foods are generally stocked in the middle of the grocery store.  o Focus on shopping on the perimeter of the grocery.  What to Include . Vegetables o GREEN LEAFY VEGETABLES: Kale, spinach, mustard greens, collard greens, cabbage, broccoli, etc. o OTHER: Asparagus, cauliflower, eggplant, carrots, peas, Brussel sprouts, tomatoes, bell peppers, zucchini, beets, cucumbers, etc. . Grains, seeds, and legumes o Beans: kidney beans, black eyed peas, garbanzo  beans, black beans, pinto beans, etc. o Whole, unrefined grains: brown rice, barley, bulgur, oatmeal, etc. . Healthy fats  o Avoid highly processed fats such as vegetable oil o Examples of healthy fats: avocado, olives, virgin olive oil, dark chocolate (?72% Cocoa), nuts (peanuts, almonds, walnuts, cashews, pecans, etc.) o Please still do small amount of these healthy fats, they are dense in calories.  . Low - Moderate Intake of Animal Sources of Protein o Meat sources: chicken, [place], salmon, tuna. Limit to 4 ounces of meat at one time or the size of your palm. o Consider limiting dairy sources, but when choosing dairy focus on: PLAIN [place] yogurt, cottage cheese, high-protein milk . Fruit o Choose berries

## 2019-12-18 LAB — CBC WITH DIFFERENTIAL/PLATELET
Absolute Monocytes: 639 cells/uL (ref 200–950)
Basophils Absolute: 31 cells/uL (ref 0–200)
Basophils Relative: 0.3 %
Eosinophils Absolute: 103 cells/uL (ref 15–500)
Eosinophils Relative: 1 %
HCT: 42 % (ref 38.5–50.0)
Hemoglobin: 14.2 g/dL (ref 13.2–17.1)
Lymphs Abs: 3008 cells/uL (ref 850–3900)
MCH: 30 pg (ref 27.0–33.0)
MCHC: 33.8 g/dL (ref 32.0–36.0)
MCV: 88.8 fL (ref 80.0–100.0)
MPV: 10.3 fL (ref 7.5–12.5)
Monocytes Relative: 6.2 %
Neutro Abs: 6520 cells/uL (ref 1500–7800)
Neutrophils Relative %: 63.3 %
Platelets: 195 10*3/uL (ref 140–400)
RBC: 4.73 10*6/uL (ref 4.20–5.80)
RDW: 12.8 % (ref 11.0–15.0)
Total Lymphocyte: 29.2 %
WBC: 10.3 10*3/uL (ref 3.8–10.8)

## 2019-12-18 LAB — TSH: TSH: 2.8 mIU/L (ref 0.40–4.50)

## 2019-12-18 LAB — COMPLETE METABOLIC PANEL WITH GFR
AG Ratio: 1.4 (calc) (ref 1.0–2.5)
ALT: 17 U/L (ref 9–46)
AST: 14 U/L (ref 10–35)
Albumin: 4 g/dL (ref 3.6–5.1)
Alkaline phosphatase (APISO): 69 U/L (ref 35–144)
BUN: 16 mg/dL (ref 7–25)
CO2: 26 mmol/L (ref 20–32)
Calcium: 9.1 mg/dL (ref 8.6–10.3)
Chloride: 103 mmol/L (ref 98–110)
Creat: 0.95 mg/dL (ref 0.70–1.25)
GFR, Est African American: 100 mL/min/{1.73_m2} (ref >=60)
GFR, Est Non African American: 86 mL/min/{1.73_m2} (ref >=60)
Globulin: 2.8 g/dL (calc) (ref 1.9–3.7)
Glucose, Bld: 151 mg/dL — ABNORMAL HIGH (ref 65–99)
Potassium: 4.4 mmol/L (ref 3.5–5.3)
Sodium: 136 mmol/L (ref 135–146)
Total Bilirubin: 0.4 mg/dL (ref 0.2–1.2)
Total Protein: 6.8 g/dL (ref 6.1–8.1)

## 2019-12-18 LAB — HEMOGLOBIN A1C
Hgb A1c MFr Bld: 9.2 % of total Hgb — ABNORMAL HIGH (ref <5.7)
Mean Plasma Glucose: 217 (calc)
eAG (mmol/L): 12 (calc)

## 2019-12-18 LAB — URINALYSIS, ROUTINE W REFLEX MICROSCOPIC
Bilirubin Urine: NEGATIVE
Hgb urine dipstick: NEGATIVE
Leukocytes,Ua: NEGATIVE
Nitrite: NEGATIVE
Protein, ur: NEGATIVE
Specific Gravity, Urine: 1.027 (ref 1.001–1.03)
pH: 5.5 (ref 5.0–8.0)

## 2019-12-18 LAB — LIPID PANEL
Cholesterol: 141 mg/dL (ref <200)
HDL: 31 mg/dL — ABNORMAL LOW (ref >=40)
LDL Cholesterol (Calc): 79 mg/dL (calc)
Non-HDL Cholesterol (Calc): 110 mg/dL (calc) (ref <130)
Total CHOL/HDL Ratio: 4.5 (calc) (ref <5.0)
Triglycerides: 212 mg/dL — ABNORMAL HIGH (ref <150)

## 2019-12-18 LAB — TESTOSTERONE: Testosterone: 223 ng/dL — ABNORMAL LOW (ref 250–827)

## 2019-12-18 LAB — VITAMIN D 25 HYDROXY (VIT D DEFICIENCY, FRACTURES): Vit D, 25-Hydroxy: 11 ng/mL — ABNORMAL LOW (ref 30–100)

## 2019-12-18 LAB — MICROALBUMIN / CREATININE URINE RATIO
Creatinine, Urine: 193 mg/dL (ref 20–320)
Microalb Creat Ratio: 10 mcg/mg creat (ref <30)
Microalb, Ur: 1.9 mg/dL

## 2019-12-18 LAB — PSA: PSA: 0.2 ng/mL (ref <=4.0)

## 2019-12-18 LAB — MAGNESIUM: Magnesium: 1.8 mg/dL (ref 1.5–2.5)

## 2020-02-04 ENCOUNTER — Other Ambulatory Visit: Payer: Self-pay | Admitting: Adult Health

## 2020-02-19 NOTE — Progress Notes (Deleted)
Office Visit Note  Patient: Luis Bonilla             Date of Birth: [birth year]           MRN: [id number]             PCP: Unk Pinto, MD Referring: Vicie Mutters, PA-C Visit Date: 02/25/2020 Occupation: @GUAROCC @  Subjective:  No chief complaint on file.   History of Present Illness: Luis Bonilla is a 64 y.o. male ***   Activities of Daily Living:  Patient reports morning stiffness for *** {minute/hour:19697}.   Patient {ACTIONS;DENIES/REPORTS:21021675::"Denies"} nocturnal pain.  Difficulty dressing/grooming: {ACTIONS;DENIES/REPORTS:21021675::"Denies"} Difficulty climbing stairs: {ACTIONS;DENIES/REPORTS:21021675::"Denies"} Difficulty getting out of chair: {ACTIONS;DENIES/REPORTS:21021675::"Denies"} Difficulty using hands for taps, buttons, cutlery, and/or writing: {ACTIONS;DENIES/REPORTS:21021675::"Denies"}  No Rheumatology ROS completed.   PMFS History:  Patient Active Problem List   Diagnosis Date Noted  . Type 2 diabetes mellitus with hyperlipidemia ([place]) 09/01/2019  . Smoking greater than 40 pack years 04/10/2018  . Type 2 diabetes mellitus ([place]) 04/09/2018  . COPD (chronic obstructive pulmonary disease) ([place]) 07/03/2016  . Vitamin D deficiency 04/12/2015  . Medication management 04/12/2015  . CKD stage 2 due to type 2 diabetes mellitus ([place]) 01/04/2015  . Tobacco use disorder 01/04/2015  . Erectile dysfunction associated with type 2 diabetes mellitus ([place]) 01/04/2015  . Morbid obesity ([place]) 12/29/2013  . Hyperlipidemia   . Hypertension   . Asthma   . Hypogonadism in male     Past Medical History:  Diagnosis Date  . Asthma   . Erectile dysfunction   . Hyperlipidemia   . Hypertension   . Other testicular hypofunction   . Type II or unspecified type diabetes mellitus without mention of complication, not stated as uncontrolled     Family History  Problem Relation Age of Onset  . Stroke Mother   . Hypertension Mother   . Diabetes Mother   .  Cancer Father        lung   No past surgical history on file. Social History   Social History Narrative  . Not on file   Immunization History  Administered Date(s) Administered  . Influenza Inj Mdck Quad With Preservative 09/02/2019  . Influenza-Unspecified 08/15/2017  . Tdap 01/04/2015     Objective: Vital Signs: There were no vitals taken for this visit.   Physical Exam   Musculoskeletal Exam: ***  CDAI Exam: CDAI Score: -- Patient Global: --; Provider Global: -- Swollen: --; Tender: -- Joint Exam 02/25/2020   No joint exam has been documented for this visit   There is currently no information documented on the homunculus. Go to the Rheumatology activity and complete the homunculus joint exam.  Investigation: No additional findings.  Imaging: No results found.  Recent Labs: Lab Results  Component Value Date   WBC 10.3 12/17/2019   HGB 14.2 12/17/2019   PLT 195 12/17/2019   NA 136 12/17/2019   K 4.4 12/17/2019   CL 103 12/17/2019   CO2 26 12/17/2019   GLUCOSE 151 (H) 12/17/2019   BUN 16 12/17/2019   CREATININE 0.95 12/17/2019   BILITOT 0.4 12/17/2019   ALKPHOS 74 03/12/2017   AST 14 12/17/2019   ALT 17 12/17/2019   PROT 6.8 12/17/2019   ALBUMIN 4.1 03/12/2017   CALCIUM 9.1 12/17/2019   GFRAA 100 12/17/2019    Speciality Comments: No specialty comments available.  Procedures:  No procedures performed Allergies: Flax [bio-flax]   Assessment / Plan:     Visit Diagnoses: No  diagnosis found.  Orders: No orders of the defined types were placed in this encounter.  No orders of the defined types were placed in this encounter.   Face-to-face time spent with patient was *** minutes. Greater than 50% of time was spent in counseling and coordination of care.  Follow-Up Instructions: No follow-ups on file.   Ofilia Neas, PA-C  Note - This record has been created using Dragon software.  Chart creation errors have been sought, but may not  always  have been located. Such creation errors do not reflect on  the standard of medical care.

## 2020-02-25 ENCOUNTER — Ambulatory Visit: Payer: Self-pay | Admitting: Rheumatology

## 2020-03-02 ENCOUNTER — Other Ambulatory Visit: Payer: Self-pay | Admitting: Adult Health

## 2020-03-02 DIAGNOSIS — E782 Mixed hyperlipidemia: Secondary | ICD-10-CM

## 2020-03-02 DIAGNOSIS — E785 Hyperlipidemia, unspecified: Secondary | ICD-10-CM

## 2020-03-23 ENCOUNTER — Ambulatory Visit: Payer: 59 | Admitting: Physician Assistant

## 2020-03-23 ENCOUNTER — Ambulatory Visit: Payer: 59 | Admitting: Adult Health Nurse Practitioner

## 2020-03-24 ENCOUNTER — Ambulatory Visit: Payer: Self-pay | Admitting: Rheumatology

## 2020-04-05 NOTE — Progress Notes (Signed)
Follow up Assessment and Plan:  Essential hypertension - continue medications, DASH diet, exercise and monitor at home. Call if greater than 130/80.  -     CBC with Differential/Platelet -     BASIC METABOLIC PANEL WITH GFR -     Hepatic function panel -     TSH  Chronic obstructive pulmonary disease, unspecified COPD type ([place]) Advised to stop smoking, continue meds/Breo.  Patient has 100 pack year smoking history Agreed to do low dose CT, no symptoms  Diabetes mellitus with CKD ([place]) Discussed general issues about diabetes pathophysiology and management., Educational material distributed., Suggested low cholesterol diet., Encouraged aerobic exercise., Discussed foot care., Reminded to get yearly retinal exam. 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 -     lovastatin (MEVACOR) 20 MG tablet; Take 1 tablet (20 mg total) by mouth at bedtime. -     Lipid panel - get back on zetia  Morbid obesity (HCC) - follow up 4 months for progress monitoring - increase veggies, decrease carbs - long discussion about weight loss, diet, and exercise  Erectile dysfunction associated with type 2 diabetes mellitus ([place]) Discussed general issues about diabetes pathophysiology and management., Educational material distributed., Suggested low cholesterol diet., Encouraged aerobic exercise., Discussed foot care., Reminded to get yearly retinal exam.  Tobacco use disorder Smoking cessation-  instruction/counseling given, counseled patient on the dangers of tobacco use, advised patient to stop smoking, and reviewed strategies to maximize  success, patient not ready to quit at this time.   Medication management -     Magnesium  Vitamin D deficiency -     VITAMIN D 25 Hydroxy (Vit-D Deficiency, Fractures)  Morbid obesity Discussed diet/exercise, weight management and risk modification   Discussed med's effects and SE's. Screening labs and tests as requested with regular follow-up as recommended. Future Appointments  Date Time Provider [place]  07/13/2020  3:45 PM Vicie Mutters, PA-C [facility] None  12/16/2020  3:00 PM Vicie Mutters, PA-C [facility] None    HPI 64 y.o. male  presents for a complete physical. His blood pressure has been controlled at home, today their BP is BP: 128/80 He does not workout. He denies chest pain, shortness of breath, dizziness.   BMI is Body mass index is 39.28 kg/m., he is working on diet and exercise.  Due to his obesity/low testosterone he has ED and uses cialis PRN.    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  Has had ventolin in the past, it is not as affective as proair, patient needs the proair filled.  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 1-2 packs a day for 50 years.  Suggest low dose CT, patient willing to do if his insurance covers  it.   Last visit he is complaining of bilateral hand pain, worse in the right hand, has been for years, will not be able to make a fist. He has a weakly positive RF factor, did not get xrays- had OV with rheum but had to go out on the road, states has not been as bad .Marland Kitchen  No other joint pain, no rashes.   He is on cholesterol medication, lovastatin 20mg , was started on zetia last visit but stopped, will  restart and denies myalgias.  His cholesterol is not at goal, less than 70. The cholesterol last visit was:   Lab Results  Component Value Date   CHOL 141 12/17/2019   HDL 31 (L) 12/17/2019   LDLCALC 79 12/17/2019   TRIG 212 (H) 12/17/2019   CHOLHDL 4.5 12/17/2019   He has not been working on diet and exercise for diabetes With hyperlipidemia not at goal denies foot ulcerations, hypoglycemia , paresthesia of the feet, polydipsia, polyuria and visual disturbances.  He is on ACE and on bASA  On metformin two pills with supper a day and glimepiride, he is on 1/2mg  BID, has not been checking sugars.  Tried invokana but could not tolerate it due to yeast. Last A1C in the office was:  Lab Results  Component Value Date   HGBA1C 9.2 (H) 12/17/2019   Lab Results  Component Value Date   GFRNONAA 86 12/17/2019   Patient is on Vitamin D supplement, on 5000 IU Lab Results  Component Value Date   VD25OH 11 (L) 12/17/2019    Current Medications:   Current Outpatient Medications (Endocrine & Metabolic):    glimepiride (AMARYL) 4 MG tablet, Take 1/2 to 1 tablet 2 x /day with Meals for Diabetes   metFORMIN (GLUCOPHAGE-XR) 500 MG 24 hr tablet, Take 2 tablets 2 x /day with meals for Diabetes  Current Outpatient Medications (Cardiovascular):    ezetimibe (ZETIA) 10 MG tablet, Take 1 tablet (10 mg total) by mouth daily.   losartan (COZAAR) 50 MG tablet, Take 1 tablet Daily for BP   lovastatin (MEVACOR) 20 MG tablet, TAKE 1 TABLET BY MOUTH EVERY DAY AT BEDTIME FOR CHOLESTEROL   tadalafil (CIALIS) 20 MG tablet, TAKE 1/2 TO 1 TABLET EVERY 2 TO 3 DAYS IF NEEDED FOR XXXX  Current Outpatient Medications (Respiratory):    fluticasone (FLONASE) 50 MCG/ACT nasal spray, USE 1 SPRAY(S) INTRANASALLY ONCE A DAY AS NEEDED   fluticasone furoate-vilanterol (BREO ELLIPTA) 100-25 MCG/INH AEPB, Inhale 1 puff into the lungs daily. Rinse mouth with water after each use   PROAIR HFA 108 (90 Base) MCG/ACT  inhaler, INHALE 2 PUFFS INTO THE LUNGS EVERY 6 HOURS AS NEEDED FOR WHEEZING OR SHORTNESS OF BREATH    Current Outpatient Medications (Other):    Cholecalciferol (VITAMIN D PO), Take 5,000 Int'l Units by mouth daily.   glucose blood test strip, 1 each by Other route as needed for other. 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   Review of Systems  Constitutional: Negative.   HENT: Negative.   Eyes: Negative.   Respiratory: Negative.   Cardiovascular: Negative.   Gastrointestinal: Negative.   Genitourinary: Negative.   Musculoskeletal: Positive for joint pain. Negative for back pain, falls, myalgias and neck pain.  Skin: Positive for rash.  Neurological: Negative.   Endo/Heme/Allergies: Negative.   Psychiatric/Behavioral: Negative for depression, hallucinations, memory loss, substance abuse and suicidal ideas. The patient is not nervous/anxious and does not have insomnia.      Physical Exam: Estimated body mass index is 39.28 kg/m as calculated from the following:   Height as of this encounter: 5\' 9"  (1.753 m).   Weight as of this encounter: 266 lb (120.7 kg). Vitals:   04/06/20 1033  BP: 128/80  Pulse: 78  Temp: (!) 97.5 F (36.4 C)  SpO2: 94%   General Appearance: Well nourished, in no apparent distress. Eyes: PERRLA, EOMs, conjunctiva no swelling or erythema, normal fundi and vessels. Sinuses: No Frontal/maxillary tenderness ENT/Mouth: Ext aud canals clear, normal light reflex  with TMs without erythema, bulging. Good dentition. No erythema, swelling, or exudate on post pharynx. Tonsils not swollen or erythematous. Hearing normal.  Neck: Supple, thyroid normal. No bruits Respiratory: Respiratory effort normal, BS equal bilaterally with mild expiratory wheeze without rales, rhonchi, or stridor. Cardio: RRR without murmurs, rubs or gallops. Brisk peripheral pulses without edema.  Chest: symmetric, with normal excursions and percussion. Abdomen: Soft, obese +BS. Non tender, no guarding, rebound, hernias, masses, or organomegaly. .  Lymphatics: Non tender without lymphadenopathy.  Musculoskeletal: Full ROM all peripheral extremities,5/5 strength, and normal gait. Skin: Warm, dry without rashes, lesions, ecchymosis.  Neuro: Cranial nerves intact, reflexes equal bilaterally. Normal muscle tone, no cerebellar symptoms. Sensation intact.  Psych: Awake and oriented X 3, normal affect, Insight and Judgment appropriate.     Vicie Mutters 12:46 PM

## 2020-04-06 ENCOUNTER — Ambulatory Visit: Payer: 59 | Admitting: Adult Health Nurse Practitioner

## 2020-04-06 ENCOUNTER — Encounter: Payer: Self-pay | Admitting: Physician Assistant

## 2020-04-06 ENCOUNTER — Other Ambulatory Visit: Payer: Self-pay

## 2020-04-06 ENCOUNTER — Ambulatory Visit: Payer: 59 | Admitting: Physician Assistant

## 2020-04-06 VITALS — BP 128/80 | HR 78 | Temp 97.5°F | Ht 69.0 in | Wt 266.0 lb

## 2020-04-06 DIAGNOSIS — I10 Essential (primary) hypertension: Secondary | ICD-10-CM

## 2020-04-06 DIAGNOSIS — N182 Chronic kidney disease, stage 2 (mild): Secondary | ICD-10-CM

## 2020-04-06 DIAGNOSIS — E1169 Type 2 diabetes mellitus with other specified complication: Secondary | ICD-10-CM | POA: Diagnosis not present

## 2020-04-06 DIAGNOSIS — E1122 Type 2 diabetes mellitus with diabetic chronic kidney disease: Secondary | ICD-10-CM | POA: Diagnosis not present

## 2020-04-06 DIAGNOSIS — E785 Hyperlipidemia, unspecified: Secondary | ICD-10-CM

## 2020-04-06 DIAGNOSIS — Z79899 Other long term (current) drug therapy: Secondary | ICD-10-CM

## 2020-04-06 DIAGNOSIS — M79641 Pain in right hand: Secondary | ICD-10-CM

## 2020-04-06 DIAGNOSIS — E782 Mixed hyperlipidemia: Secondary | ICD-10-CM

## 2020-04-06 DIAGNOSIS — N521 Erectile dysfunction due to diseases classified elsewhere: Secondary | ICD-10-CM

## 2020-04-06 DIAGNOSIS — J449 Chronic obstructive pulmonary disease, unspecified: Secondary | ICD-10-CM | POA: Diagnosis not present

## 2020-04-06 DIAGNOSIS — E559 Vitamin D deficiency, unspecified: Secondary | ICD-10-CM

## 2020-04-06 DIAGNOSIS — F17200 Nicotine dependence, unspecified, uncomplicated: Secondary | ICD-10-CM

## 2020-04-06 NOTE — Patient Instructions (Signed)
Diabetes is a very complicated disease...lets simplify it.  An easy way to look at it to understand the complications is if you think of the extra sugar floating in your blood stream as glass shards floating through your blood stream.    Diabetes affects your small vessels first: 1) The glass shards (sugar) scraps down the tiny blood vessels in your eyes and lead to diabetic retinopathy, the leading cause of blindness in the [place]. Diabetes is the leading cause of newly diagnosed adult (20 to 62 years of age) blindness in the [place].  2) The glass shards scratches down the tiny vessels of your legs leading to nerve damage called neuropathy and can lead to amputations of your feet. More than 60% of all non-traumatic amputations of lower limbs occur in people with diabetes.  3) Over time the small vessels in your brain are shredded and closed off, individually this does not cause any problems but over a long period of time many of the small vessels being blocked can lead to Vascular Dementia.   4) Your kidney's are a filter system and have a "net" that keeps certain things in the body and lets bad things out. Sugar shreds this net and leads to kidney damage and eventually failure. Decreasing the sugar that is destroying the net and certain blood pressure medications can help stop or decrease progression of kidney disease. Diabetes was the primary cause of kidney failure in 44 percent of all new cases in 2011.  5) Diabetes also destroys the small vessels in your penis that lead to erectile dysfunction. Eventually the vessels are so damaged that you may not be responsive to cialis or viagra.   Diabetes and your large vessels: Your larger vessels consist of your coronary arteries in your heart and the carotid vessels to your brain. Diabetes or even increased sugars put you at 300% increased risk of heart attack and stroke and this is why.. The sugar scrapes down your large blood vessels and your body  sees this as an internal injury and tries to repair itself. Just like you get a scab on your skin, your platelets will stick to the blood vessel wall trying to heal it. This is why we have diabetics on low dose aspirin daily, this prevents the platelets from sticking and can prevent plaque formation. In addition, your body takes cholesterol and tries to shove it into the open wound. This is why we want your LDL, or bad cholesterol, below 70.   The combination of platelets and cholesterol over 5-10 years forms plaque that can break off and cause a heart attack or stroke.   PLEASE REMEMBER:  Diabetes is preventable! Up to 85 percent of complications and morbidities among individuals with type 2 diabetes can be prevented, delayed, or effectively treated and minimized with regular visits to a health professional, appropriate monitoring and medication, and a healthy diet and lifestyle.     Bad carbs also include fruit juice, alcohol, and sweet tea. These are empty calories that do not signal to your brain that you are full.   Please remember the good carbs are still carbs which convert into sugar. So please measure them out no more than 1/2-1 cup of rice, oatmeal, pasta, and beans  Veggies are however free foods! Pile them on.   Not all fruit is created equal. Please see the list below, the fruit at the bottom is higher in sugars than the fruit at the top. Please avoid all dried fruits.     

## 2020-04-07 LAB — HEMOGLOBIN A1C
Hgb A1c MFr Bld: 7.5 % of total Hgb — ABNORMAL HIGH (ref <5.7)
Mean Plasma Glucose: 169 (calc)
eAG (mmol/L): 9.3 (calc)

## 2020-04-07 LAB — CBC WITH DIFFERENTIAL/PLATELET
Absolute Monocytes: 646 cells/uL (ref 200–950)
Basophils Absolute: 17 cells/uL (ref 0–200)
Basophils Relative: 0.2 %
Eosinophils Absolute: 94 cells/uL (ref 15–500)
Eosinophils Relative: 1.1 %
HCT: 43.2 % (ref 38.5–50.0)
Hemoglobin: 14 g/dL (ref 13.2–17.1)
Lymphs Abs: 2499 cells/uL (ref 850–3900)
MCH: 29.2 pg (ref 27.0–33.0)
MCHC: 32.4 g/dL (ref 32.0–36.0)
MCV: 90 fL (ref 80.0–100.0)
MPV: 10.1 fL (ref 7.5–12.5)
Monocytes Relative: 7.6 %
Neutro Abs: 5245 cells/uL (ref 1500–7800)
Neutrophils Relative %: 61.7 %
Platelets: 213 10*3/uL (ref 140–400)
RBC: 4.8 10*6/uL (ref 4.20–5.80)
RDW: 12.9 % (ref 11.0–15.0)
Total Lymphocyte: 29.4 %
WBC: 8.5 10*3/uL (ref 3.8–10.8)

## 2020-04-07 LAB — LIPID PANEL
Cholesterol: 108 mg/dL (ref <200)
HDL: 30 mg/dL — ABNORMAL LOW (ref >=40)
LDL Cholesterol (Calc): 61 mg/dL (calc)
Non-HDL Cholesterol (Calc): 78 mg/dL (calc) (ref <130)
Total CHOL/HDL Ratio: 3.6 (calc) (ref <5.0)
Triglycerides: 86 mg/dL (ref <150)

## 2020-04-07 LAB — COMPLETE METABOLIC PANEL WITH GFR
AG Ratio: 1.5 (calc) (ref 1.0–2.5)
ALT: 14 U/L (ref 9–46)
AST: 13 U/L (ref 10–35)
Albumin: 4 g/dL (ref 3.6–5.1)
Alkaline phosphatase (APISO): 65 U/L (ref 35–144)
BUN: 20 mg/dL (ref 7–25)
CO2: 27 mmol/L (ref 20–32)
Calcium: 9.4 mg/dL (ref 8.6–10.3)
Chloride: 104 mmol/L (ref 98–110)
Creat: 1.02 mg/dL (ref 0.70–1.25)
GFR, Est African American: 91 mL/min/{1.73_m2} (ref >=60)
GFR, Est Non African American: 78 mL/min/{1.73_m2} (ref >=60)
Globulin: 2.7 g/dL (calc) (ref 1.9–3.7)
Glucose, Bld: 117 mg/dL — ABNORMAL HIGH (ref 65–99)
Potassium: 4.9 mmol/L (ref 3.5–5.3)
Sodium: 139 mmol/L (ref 135–146)
Total Bilirubin: 0.4 mg/dL (ref 0.2–1.2)
Total Protein: 6.7 g/dL (ref 6.1–8.1)

## 2020-04-07 LAB — VITAMIN D 25 HYDROXY (VIT D DEFICIENCY, FRACTURES): Vit D, 25-Hydroxy: 20 ng/mL — ABNORMAL LOW (ref 30–100)

## 2020-04-07 LAB — RHEUMATOID FACTOR: Rheumatoid fact SerPl-aCnc: <14 IU/mL (ref <14)

## 2020-04-07 LAB — TSH: TSH: 2.41 mIU/L (ref 0.40–4.50)

## 2020-05-11 ENCOUNTER — Other Ambulatory Visit: Payer: Self-pay | Admitting: Physician Assistant

## 2020-05-30 ENCOUNTER — Other Ambulatory Visit: Payer: Self-pay | Admitting: Internal Medicine

## 2020-05-30 DIAGNOSIS — N521 Erectile dysfunction due to diseases classified elsewhere: Secondary | ICD-10-CM

## 2020-05-30 DIAGNOSIS — E119 Type 2 diabetes mellitus without complications: Secondary | ICD-10-CM

## 2020-06-16 ENCOUNTER — Other Ambulatory Visit: Payer: Self-pay | Admitting: Internal Medicine

## 2020-06-16 DIAGNOSIS — E1169 Type 2 diabetes mellitus with other specified complication: Secondary | ICD-10-CM

## 2020-06-25 ENCOUNTER — Other Ambulatory Visit: Payer: Self-pay | Admitting: Adult Health

## 2020-07-13 ENCOUNTER — Ambulatory Visit: Payer: 59 | Admitting: Physician Assistant

## 2020-07-13 ENCOUNTER — Encounter: Payer: Self-pay | Admitting: Physician Assistant

## 2020-07-13 ENCOUNTER — Other Ambulatory Visit: Payer: Self-pay

## 2020-07-13 VITALS — BP 126/74 | HR 60 | Temp 97.2°F | Wt 262.0 lb

## 2020-07-13 DIAGNOSIS — E782 Mixed hyperlipidemia: Secondary | ICD-10-CM

## 2020-07-13 DIAGNOSIS — E559 Vitamin D deficiency, unspecified: Secondary | ICD-10-CM

## 2020-07-13 DIAGNOSIS — F17210 Nicotine dependence, cigarettes, uncomplicated: Secondary | ICD-10-CM

## 2020-07-13 DIAGNOSIS — N182 Chronic kidney disease, stage 2 (mild): Secondary | ICD-10-CM

## 2020-07-13 DIAGNOSIS — E785 Hyperlipidemia, unspecified: Secondary | ICD-10-CM

## 2020-07-13 DIAGNOSIS — E1122 Type 2 diabetes mellitus with diabetic chronic kidney disease: Secondary | ICD-10-CM | POA: Diagnosis not present

## 2020-07-13 DIAGNOSIS — E1169 Type 2 diabetes mellitus with other specified complication: Secondary | ICD-10-CM

## 2020-07-13 DIAGNOSIS — Z79899 Other long term (current) drug therapy: Secondary | ICD-10-CM

## 2020-07-13 MED ORDER — VITAMIN D (ERGOCALCIFEROL) 1.25 MG (50000 UNIT) PO CAPS: 50000.0000 [IU] | ORAL_CAPSULE | ORAL | 1 refills | Status: DC

## 2020-07-13 NOTE — Progress Notes (Signed)
Follow up Assessment and Plan:  Essential hypertension - continue medications, DASH diet, exercise and monitor at home. Call if greater than 130/80.  -     CBC with Differential/Platelet -     BASIC METABOLIC PANEL WITH GFR -     Hepatic function panel -     TSH  Chronic obstructive pulmonary disease, unspecified COPD type ([place]) Advised to stop smoking, continue meds/Breo.  Patient has 100 pack year smoking history Agreed to do low dose CT, no symptoms  Diabetes mellitus with CKD ([place]) Discussed general issues about diabetes pathophysiology and management., Educational material distributed., Suggested low cholesterol diet., Encouraged aerobic exercise., Discussed foot care., Reminded to get yearly retinal exam. 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 -     lovastatin (MEVACOR) 20 MG tablet; Take 1 tablet (20 mg total) by mouth at bedtime. -     Lipid panel - get back on zetia  Morbid obesity (HCC) - follow up 4 months for progress monitoring - increase veggies, decrease carbs - long discussion about weight loss, diet, and exercise  Erectile dysfunction associated with type 2 diabetes mellitus ([place]) Discussed general issues about diabetes pathophysiology and management., Educational material distributed., Suggested low cholesterol diet., Encouraged aerobic exercise., Discussed foot care., Reminded to get  yearly retinal exam.  Tobacco use disorder Smoking cessation-  instruction/counseling given, counseled patient on the dangers of tobacco use, advised patient to stop smoking, and reviewed strategies to maximize success, patient not ready to quit at this time.   Medication management -     Magnesium  Vitamin D deficiency -     VITAMIN D 25 Hydroxy (Vit-D Deficiency, Fractures)  Morbid obesity Discussed diet/exercise, weight management and risk modification   Discussed med's effects and SE's. Screening labs and tests as requested with regular follow-up as recommended. Future Appointments  Date Time Provider [place]  12/16/2020  3:00 PM Garnet Sierras, NP [facility] None    HPI 64 y.o. male  presents for a complete physical. His blood pressure has been controlled at home, today their BP is BP: 126/74 He does not workout. He denies chest pain, shortness of breath, dizziness.   BMI is Body mass index is 38.69 kg/m., he is working on diet and exercise.  Due to his obesity/low testosterone he has ED and uses cialis PRN.    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  Has had ventolin in the past, it is not as affective as proair, patient needs the proair filled.  He can not be on  chantix due to DOT rules, states that the patches did not help, has been on wellbutrin in the past. 1-2 packs a day for 50 years.  Suggest low dose CT, patient willing to do if his insurance covers it.- will put back in the order if we need to  Last visit he is complaining of bilateral hand pain, worse in the right hand, has been for years, will not be able to make a fist. He has a weakly positive  RF factor at one point but this last visit in June it was negative, did not get xrays- had OV with rheum but had to go out on the road, states has not been as bad . No other joint pain, no rashes.   He is on cholesterol medication, lovastatin 20mg , was started on zetia last visit but stopped, will restart and denies myalgias.  His cholesterol is not at goal, less than 70. The cholesterol last visit was:   Lab Results  Component Value Date   CHOL 108 04/06/2020   HDL 30 (L) 04/06/2020   LDLCALC 61 04/06/2020   TRIG 86 04/06/2020   CHOLHDL 3.6 04/06/2020   He has not been working on diet and exercise for diabetes With hyperlipidemia at goal last time denies foot ulcerations, hypoglycemia , paresthesia of the feet, polydipsia, polyuria and visual disturbances.  He is on ACE and on bASA  On metformin two pills with supper a day and glimepiride, he is on 1/2mg  BID, has not been checking sugars.  Tried invokana but could not tolerate it due to yeast. Last A1C in the office was:  Lab Results  Component Value Date   HGBA1C 7.5 (H) 04/06/2020   Lab Results  Component Value Date   GFRNONAA 78 04/06/2020   Patient is on Vitamin D supplement, on 5000 IU Lab Results  Component Value Date   VD25OH 20 (L) 04/06/2020    Current Medications:   Current Outpatient Medications (Endocrine & Metabolic):    glimepiride (AMARYL) 4 MG tablet, Take 1/2 to 1 tablet 2 x /day with Meals for Diabetes   metFORMIN (GLUCOPHAGE-XR) 500 MG 24 hr tablet, Take 2 tablets    2 x day    with Meals for Diabetes  Current Outpatient Medications (Cardiovascular):    ezetimibe (ZETIA) 10 MG tablet, TAKE 1 TABLET BY MOUTH EVERY DAY   losartan (COZAAR) 50 MG tablet, Take 1 tablet Daily for BP   lovastatin (MEVACOR) 20 MG tablet, TAKE 1 TABLET BY MOUTH EVERY DAY AT BEDTIME FOR CHOLESTEROL   tadalafil (CIALIS) 20 MG tablet, TAKE 1/2 TO 1 TABLET EVERY 2 TO 3 DAYS IF NEEDED FOR XXXX  Current Outpatient Medications  (Respiratory):    fluticasone (FLONASE) 50 MCG/ACT nasal spray, USE 1 SPRAY(S) INTRANASALLY ONCE A DAY AS NEEDED   fluticasone furoate-vilanterol (BREO ELLIPTA) 100-25 MCG/INH AEPB, Inhale 1 puff into the lungs daily. Rinse mouth with water after each use   PROAIR HFA 108 (90 Base) MCG/ACT inhaler, INHALE 2 PUFFS INTO THE LUNGS EVERY 6 HOURS AS NEEDED FOR WHEEZING OR SHORTNESS OF BREATH    Current Outpatient Medications (Other):    Cholecalciferol (VITAMIN D PO), Take 5,000 Int'l Units by mouth daily.   glucose blood test strip, 1 each by Other route as needed for other. 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   Review of Systems  Constitutional: Negative.   HENT: Negative.   Eyes: Negative.   Respiratory: Negative.   Cardiovascular: Negative.   Gastrointestinal: Negative.   Genitourinary: Negative.   Musculoskeletal: Positive for joint pain. Negative for back pain, falls, myalgias and neck pain.  Skin: Positive for rash.  Neurological: Negative.   Endo/Heme/Allergies: Negative.   Psychiatric/Behavioral: Negative for depression, hallucinations, memory loss, substance abuse and suicidal ideas. The patient is not nervous/anxious and does not have insomnia.      Physical Exam: Estimated body mass index is 38.69 kg/m as calculated from the following:   Height as of 04/06/20: 5\' 9"  (1.753 m).   Weight as of this encounter: 262 lb (118.8 kg). Vitals:   07/13/20 1548  BP: 126/74  Pulse:  60  Temp: (!) 97.2 F (36.2 C)  SpO2: 94%   General Appearance: Well nourished, in no apparent distress. Eyes: PERRLA, EOMs, conjunctiva no swelling or erythema, normal fundi and vessels. Sinuses: No Frontal/maxillary tenderness ENT/Mouth: Ext aud canals clear, normal light reflex with TMs without erythema, bulging. Good dentition. No erythema, swelling, or exudate on post pharynx. Tonsils not swollen or erythematous. Hearing normal.  Neck: Supple, thyroid normal. No bruits Respiratory: Respiratory effort normal, BS equal bilaterally with mild expiratory wheeze without rales, rhonchi, or stridor. Cardio: RRR without murmurs, rubs or gallops. Brisk peripheral pulses without edema.  Chest: symmetric, with normal excursions and percussion. Abdomen: Soft, obese +BS. Non tender, no guarding, rebound, hernias, masses, or organomegaly. .  Lymphatics: Non tender without lymphadenopathy.  Musculoskeletal: Full ROM all peripheral extremities,5/5 strength, and normal gait. Skin: Warm, dry without rashes, lesions, ecchymosis.  Neuro: Cranial nerves intact, reflexes equal bilaterally. Normal muscle tone, no cerebellar symptoms. Sensation intact.  Psych: Awake and oriented X 3, normal affect, Insight and Judgment appropriate.     Vicie Mutters 4:10 PM

## 2020-07-13 NOTE — Patient Instructions (Signed)
  Start TRULICITY injection as shown once a week. The starting dose is 0.75 mg on the pen for the first 2 weeks.  You may inject in the stomach, thigh or arm. You may experience nausea in the first few days which usually goes away.   You will feel fullness of the stomach with starting the medication and should try to keep the portions at meals small.  After 2 weeks increase the dose to 1.5mg  daily if no nausea present.    If any questions or concerns are present call the office  Please check blood sugars at least half the time about 2 hours after any meal and 3 times per week on waking up. Please bring blood sugar monitor to each visit. Recommended blood sugar levels about 2 hours after meal is 140-180 and on waking up 90-130  General eating tips  What to Avoid . Avoid added sugars o Often added sugar can be found in processed foods such as many condiments, dry cereals, cakes, cookies, chips, crisps, crackers, candies, sweetened drinks, etc.  o Read labels and AVOID/DECREASE use of foods with the following in their ingredient list: Sugar, fructose, high fructose corn syrup, sucrose, glucose, maltose, dextrose, molasses, cane sugar, brown sugar, any type of syrup, agave nectar, etc.   . Avoid snacking in between meals- drink water or if you feel you need a snack, pick a high water content snack such as cucumbers, watermelon, or any veggie.  Marland Kitchen Avoid foods made with flour o If you are going to eat food made with flour, choose those made with whole-grains; and, minimize your consumption as much as is tolerable . Avoid processed foods o These foods are generally stocked in the middle of the grocery store.  o Focus on shopping on the perimeter of the grocery.  What to Include . Vegetables o GREEN LEAFY VEGETABLES: Kale, spinach, mustard greens, collard greens, cabbage, broccoli, etc. o OTHER: Asparagus, cauliflower, eggplant, carrots, peas, Brussel sprouts, tomatoes, bell peppers, zucchini, beets,  cucumbers, etc. . Grains, seeds, and legumes o Beans: kidney beans, black eyed peas, garbanzo beans, black beans, pinto beans, etc. o Whole, unrefined grains: brown rice, barley, bulgur, oatmeal, etc. . Healthy fats  o Avoid highly processed fats such as vegetable oil o Examples of healthy fats: avocado, olives, virgin olive oil, dark chocolate (?72% Cocoa), nuts (peanuts, almonds, walnuts, cashews, pecans, etc.) o Please still do small amount of these healthy fats, they are dense in calories.  . Low - Moderate Intake of Animal Sources of Protein o Meat sources: chicken, [place], salmon, tuna. Limit to 4 ounces of meat at one time or the size of your palm. o Consider limiting dairy sources, but when choosing dairy focus on: PLAIN [place] yogurt, cottage cheese, high-protein milk . Fruit o Choose berries

## 2020-07-14 LAB — COMPLETE METABOLIC PANEL WITH GFR
AG Ratio: 1.5 (calc) (ref 1.0–2.5)
ALT: 11 U/L (ref 9–46)
AST: 10 U/L (ref 10–35)
Albumin: 4 g/dL (ref 3.6–5.1)
Alkaline phosphatase (APISO): 74 U/L (ref 35–144)
BUN: 10 mg/dL (ref 7–25)
CO2: 31 mmol/L (ref 20–32)
Calcium: 9.6 mg/dL (ref 8.6–10.3)
Chloride: 103 mmol/L (ref 98–110)
Creat: 0.84 mg/dL (ref 0.70–1.25)
GFR, Est African American: 109 mL/min/{1.73_m2} (ref >=60)
GFR, Est Non African American: 94 mL/min/{1.73_m2} (ref >=60)
Globulin: 2.7 g/dL (calc) (ref 1.9–3.7)
Glucose, Bld: 183 mg/dL — ABNORMAL HIGH (ref 65–99)
Potassium: 4.3 mmol/L (ref 3.5–5.3)
Sodium: 138 mmol/L (ref 135–146)
Total Bilirubin: 0.3 mg/dL (ref 0.2–1.2)
Total Protein: 6.7 g/dL (ref 6.1–8.1)

## 2020-07-14 LAB — CBC WITH DIFFERENTIAL/PLATELET
Absolute Monocytes: 655 cells/uL (ref 200–950)
Basophils Absolute: 34 cells/uL (ref 0–200)
Basophils Relative: 0.3 %
Eosinophils Absolute: 124 cells/uL (ref 15–500)
Eosinophils Relative: 1.1 %
HCT: 43.5 % (ref 38.5–50.0)
Hemoglobin: 14.6 g/dL (ref 13.2–17.1)
Lymphs Abs: 2746 cells/uL (ref 850–3900)
MCH: 29.6 pg (ref 27.0–33.0)
MCHC: 33.6 g/dL (ref 32.0–36.0)
MCV: 88.2 fL (ref 80.0–100.0)
MPV: 9.9 fL (ref 7.5–12.5)
Monocytes Relative: 5.8 %
Neutro Abs: 7741 cells/uL (ref 1500–7800)
Neutrophils Relative %: 68.5 %
Platelets: 214 10*3/uL (ref 140–400)
RBC: 4.93 10*6/uL (ref 4.20–5.80)
RDW: 12.7 % (ref 11.0–15.0)
Total Lymphocyte: 24.3 %
WBC: 11.3 10*3/uL — ABNORMAL HIGH (ref 3.8–10.8)

## 2020-07-14 LAB — HEMOGLOBIN A1C
Hgb A1c MFr Bld: 9 % of total Hgb — ABNORMAL HIGH (ref <5.7)
Mean Plasma Glucose: 212 (calc)
eAG (mmol/L): 11.7 (calc)

## 2020-07-14 LAB — LIPID PANEL
Cholesterol: 132 mg/dL (ref <200)
HDL: 35 mg/dL — ABNORMAL LOW (ref >=40)
LDL Cholesterol (Calc): 68 mg/dL (calc)
Non-HDL Cholesterol (Calc): 97 mg/dL (calc) (ref <130)
Total CHOL/HDL Ratio: 3.8 (calc) (ref <5.0)
Triglycerides: 219 mg/dL — ABNORMAL HIGH (ref <150)

## 2020-07-14 LAB — MAGNESIUM: Magnesium: 1.6 mg/dL (ref 1.5–2.5)

## 2020-07-14 LAB — TSH: TSH: 2.34 mIU/L (ref 0.40–4.50)

## 2020-07-22 ENCOUNTER — Other Ambulatory Visit: Payer: Self-pay | Admitting: Adult Health Nurse Practitioner

## 2020-07-27 ENCOUNTER — Other Ambulatory Visit: Payer: Self-pay | Admitting: Internal Medicine

## 2020-07-27 DIAGNOSIS — I10 Essential (primary) hypertension: Secondary | ICD-10-CM

## 2020-07-27 LAB — HM DIABETES EYE EXAM

## 2020-07-28 ENCOUNTER — Ambulatory Visit: Payer: Self-pay

## 2020-08-19 ENCOUNTER — Encounter: Payer: Self-pay | Admitting: *Deleted

## 2020-08-30 ENCOUNTER — Other Ambulatory Visit: Payer: Self-pay | Admitting: Internal Medicine

## 2020-08-30 DIAGNOSIS — E119 Type 2 diabetes mellitus without complications: Secondary | ICD-10-CM

## 2020-10-11 ENCOUNTER — Other Ambulatory Visit: Payer: Self-pay | Admitting: Adult Health

## 2020-10-11 DIAGNOSIS — E782 Mixed hyperlipidemia: Secondary | ICD-10-CM

## 2020-10-11 DIAGNOSIS — E785 Hyperlipidemia, unspecified: Secondary | ICD-10-CM

## 2020-10-19 ENCOUNTER — Ambulatory Visit: Payer: 59 | Admitting: Adult Health Nurse Practitioner

## 2020-11-03 ENCOUNTER — Other Ambulatory Visit: Payer: Self-pay

## 2020-11-03 ENCOUNTER — Ambulatory Visit: Payer: 59 | Admitting: Adult Health Nurse Practitioner

## 2020-11-03 ENCOUNTER — Encounter: Payer: Self-pay | Admitting: Adult Health Nurse Practitioner

## 2020-11-03 VITALS — BP 130/74 | HR 87 | Temp 97.9°F | Wt 258.0 lb

## 2020-11-03 DIAGNOSIS — E1122 Type 2 diabetes mellitus with diabetic chronic kidney disease: Secondary | ICD-10-CM

## 2020-11-03 DIAGNOSIS — I10 Essential (primary) hypertension: Secondary | ICD-10-CM | POA: Diagnosis not present

## 2020-11-03 DIAGNOSIS — E782 Mixed hyperlipidemia: Secondary | ICD-10-CM | POA: Diagnosis not present

## 2020-11-03 DIAGNOSIS — E559 Vitamin D deficiency, unspecified: Secondary | ICD-10-CM | POA: Diagnosis not present

## 2020-11-03 DIAGNOSIS — N182 Chronic kidney disease, stage 2 (mild): Secondary | ICD-10-CM

## 2020-11-03 NOTE — Progress Notes (Signed)
FOLLOW UP 3 MONTH  Assessment and Plan:  Essential hypertension - continue medications, DASH diet, exercise and monitor at home. Call if greater than 130/80.  -     CBC with Differential/Platelet -     BASIC METABOLIC PANEL WITH GFR -     Hepatic function panel -     TSH  Chronic obstructive pulmonary disease, unspecified COPD type ([place]) Advised to stop smoking, continue meds/Breo.  Patient has 100 pack year smoking history Agreed to do low dose CT, no symptoms  Diabetes mellitus with CKD ([place]) Discussed general issues about diabetes pathophysiology and management., Educational material distributed., Suggested low cholesterol diet., Encouraged aerobic exercise., Discussed foot care., Reminded to get yearly retinal exam. 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 -     lovastatin (MEVACOR) 20 MG tablet; Take 1 tablet (20 mg total) by mouth at bedtime. -     Lipid panel - get back on zetia  Morbid obesity ([facility]) - follow up 4 months for progress monitoring - increase veggies, decrease carbs - long discussion about weight loss, diet, and exercise  Erectile dysfunction associated with type 2 diabetes mellitus ([place]) Discussed general issues about diabetes pathophysiology and management., Educational material distributed., Suggested low cholesterol diet., Encouraged aerobic exercise., Discussed foot care.,  Reminded to get yearly retinal exam.  Tobacco use disorder Smoking cessation-  instruction/counseling given, counseled patient on the dangers of tobacco use, advised patient to stop smoking, and reviewed strategies to maximize success, patient not ready to quit at this time.   Medication management -     Magnesium  Vitamin D deficiency -     VITAMIN D 25 Hydroxy (Vit-D Deficiency, Fractures)  Morbid obesity Discussed diet/exercise, weight management and risk modification   Discussed med's effects and SE's. Screening labs and tests as requested with regular follow-up as recommended. Future Appointments  Date Time Provider [place]  01/18/2021  2:00 PM Garnet Sierras, NP [facility] None    HPI 65 y.o. male  presents for a three motnh follow up on HTN, HLD, T2DM.  He reports overall he is doing well, no health or medication concerns today.   His blood pressure has been controlled at home, today their BP is BP: 130/74 He does not workout. He denies chest pain, shortness of breath, dizziness.   BMI is Body mass index is 38.1 kg/m., he is working on diet and exercise.  Due to his obesity/low testosterone he has ED and uses cialis PRN.    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  Has  had ventolin in the past, it is not as affective as proair, patient needs the proair filled.  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 1-2 packs a day for 50 years.  Suggest low dose CT,did not do this last year.  Will place new order for this.  Last visit he is complaining of bilateral hand pain, worse in the right hand,  has been for years, will not be able to make a fist. He has a weakly positive RF factor at one point but this last visit in June it was negative, did not get xrays- had OV with rheum but had to go out on the road, states has not been as bad . No other joint pain, no rashes.   He is on cholesterol medication, lovastatin 20mg  and zetia 10mg  and denies myalgias.  His cholesterol is not at goal, less than 70. The cholesterol last visit was:   Lab Results  Component Value Date   CHOL 132 07/13/2020   HDL 35 (L) 07/13/2020   LDLCALC 68 07/13/2020   TRIG 219 (H) 07/13/2020   CHOLHDL 3.8 07/13/2020   He has not been working on diet and exercise for diabetes With hyperlipidemia at goal last time denies foot ulcerations, hypoglycemia , paresthesia of the feet, polydipsia, polyuria and visual disturbances.  He is on ACE and on bASA  On metformin two pills with supper a day and glimepiride, he is on 1/2mg  BID, has not been checking sugars.  Tried invokana but could not tolerate it due to yeast. Last A1C in the office was:  Lab Results  Component Value Date   HGBA1C 9.0 (H) 07/13/2020   Lab Results  Component Value Date   GFRNONAA 94 07/13/2020   Patient is on Vitamin D supplement, on 5000 IU Lab Results  Component Value Date   VD25OH 20 (L) 04/06/2020    Current Medications:   Current Outpatient Medications (Endocrine & Metabolic):  .  glimepiride (AMARYL) 4 MG tablet, TAKE 1/2 TO 1 TABLET 2 X /DAY WITH MEALS FOR DIABETES .  metFORMIN (GLUCOPHAGE-XR) 500 MG 24 hr tablet, Take 2 tablets    2 x day    with Meals for Diabetes  Current Outpatient Medications (Cardiovascular):  .  ezetimibe (ZETIA) 10 MG tablet, TAKE 1 TABLET BY MOUTH EVERY DAY .  losartan (COZAAR) 50 MG tablet, Take     1 tablet      Daily      for BP .  lovastatin (MEVACOR) 20 MG tablet, TAKE 1 TABLET BY MOUTH EVERY DAY AT BEDTIME FOR CHOLESTEROL .  tadalafil (CIALIS) 20 MG tablet, TAKE 1/2 TO 1 TABLET EVERY 2 TO 3 DAYS  IF NEEDED FOR XXXX  Current Outpatient Medications (Respiratory):  .  fluticasone (FLONASE) 50 MCG/ACT nasal spray, USE 1 SPRAY(S) INTRANASALLY ONCE A DAY AS NEEDED .  fluticasone furoate-vilanterol (BREO ELLIPTA) 100-25 MCG/INH AEPB, Use      1 inhalation     Daily .  PROAIR HFA 108 (90 Base) MCG/ACT inhaler, INHALE 2 PUFFS INTO THE LUNGS EVERY 6 HOURS AS NEEDED FOR WHEEZING OR SHORTNESS OF BREATH    Current Outpatient Medications (Other):  Marland Kitchen  Cholecalciferol (VITAMIN D PO), Take 5,000 Int'l Units by mouth daily. Marland Kitchen  glucose blood test strip, 1 each by Other route as needed for other. Test Blood Sugar Once daily .  Vitamin D, Ergocalciferol, (DRISDOL) 1.25 MG (  50000 UNIT) CAPS capsule, Take 1 capsule (50,000 Units total) by mouth every 7 (seven) days. 1 pill 3 days a week for vitamin d deficiency  Medical History:  Past Medical History:  Diagnosis Date  . Asthma   . Erectile dysfunction   . Hyperlipidemia   . Hypertension   . Other testicular hypofunction   . Type II or unspecified type diabetes mellitus without mention of complication, not stated as uncontrolled    Allergies Allergies  Allergen Reactions  . 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   Review of Systems  Constitutional: Negative.   HENT: Negative.   Eyes: Negative.   Respiratory: Negative.   Cardiovascular: Negative.   Gastrointestinal: Negative.   Genitourinary: Negative.   Musculoskeletal: Positive for joint pain. Negative for back pain, falls, myalgias and neck pain.  Skin: Negative for rash.  Neurological: Negative.   Endo/Heme/Allergies: Negative.   Psychiatric/Behavioral: Negative for depression, hallucinations, memory loss, substance abuse and suicidal ideas. The patient is not nervous/anxious and does not have insomnia.      Physical Exam: Estimated body  mass index is 38.1 kg/m as calculated from the following:   Height as of 04/06/20: 5\' 9"  (1.753 m).   Weight as of this encounter: 258 lb (117 kg). Vitals:   11/03/20 1349  BP: 130/74  Pulse: 87  Temp: 97.9 F (36.6 C)  SpO2: 96%   General Appearance: Well nourished, in no apparent distress. Eyes: PERRLA, EOMs, conjunctiva no swelling or erythema, normal fundi and vessels. Sinuses: No Frontal/maxillary tenderness ENT/Mouth: Ext aud canals clear, normal light reflex with TMs without erythema, bulging. Good dentition. No erythema, swelling, or exudate on post pharynx. Tonsils not swollen or erythematous. Hearing normal.  Neck: Supple, thyroid normal. No bruits Respiratory: Respiratory effort normal, BS equal bilaterally with mild expiratory wheeze without rales, rhonchi, or stridor. Cardio: RRR without murmurs, rubs or gallops. Brisk peripheral pulses without edema.  Chest: symmetric, with normal excursions and percussion. Abdomen: Soft, obese +BS. Non tender, no guarding, rebound, hernias, masses, or organomegaly. .  Lymphatics: Non tender without lymphadenopathy.  Musculoskeletal: Full ROM all peripheral extremities,5/5 strength, and normal gait. Skin: Warm, dry without rashes, lesions, ecchymosis.  Neuro: Cranial nerves intact, reflexes equal bilaterally. Normal muscle tone, no cerebellar symptoms. Sensation intact.  Psych: Awake and oriented X 3, normal affect, Insight and Judgment appropriate.      Garnet Sierras, Laqueta Jean, DNP [facility] 11/03/2020  2:05 PM

## 2020-11-04 LAB — LIPID PANEL
Cholesterol: 118 mg/dL (ref <200)
HDL: 33 mg/dL — ABNORMAL LOW (ref >=40)
LDL Cholesterol (Calc): 63 mg/dL (calc)
Non-HDL Cholesterol (Calc): 85 mg/dL (calc) (ref <130)
Total CHOL/HDL Ratio: 3.6 (calc) (ref <5.0)
Triglycerides: 136 mg/dL (ref <150)

## 2020-11-04 LAB — CBC WITH DIFFERENTIAL/PLATELET
Absolute Monocytes: 650 cells/uL (ref 200–950)
Basophils Absolute: 18 cells/uL (ref 0–200)
Basophils Relative: 0.2 %
Eosinophils Absolute: 89 cells/uL (ref 15–500)
Eosinophils Relative: 1 %
HCT: 43.7 % (ref 38.5–50.0)
Hemoglobin: 14.8 g/dL (ref 13.2–17.1)
Lymphs Abs: 2634 cells/uL (ref 850–3900)
MCH: 29.8 pg (ref 27.0–33.0)
MCHC: 33.9 g/dL (ref 32.0–36.0)
MCV: 87.9 fL (ref 80.0–100.0)
MPV: 10 fL (ref 7.5–12.5)
Monocytes Relative: 7.3 %
Neutro Abs: 5509 cells/uL (ref 1500–7800)
Neutrophils Relative %: 61.9 %
Platelets: 198 10*3/uL (ref 140–400)
RBC: 4.97 10*6/uL (ref 4.20–5.80)
RDW: 12.6 % (ref 11.0–15.0)
Total Lymphocyte: 29.6 %
WBC: 8.9 10*3/uL (ref 3.8–10.8)

## 2020-11-04 LAB — COMPLETE METABOLIC PANEL WITH GFR
AG Ratio: 1.5 (calc) (ref 1.0–2.5)
ALT: 12 U/L (ref 9–46)
AST: 10 U/L (ref 10–35)
Albumin: 4.1 g/dL (ref 3.6–5.1)
Alkaline phosphatase (APISO): 73 U/L (ref 35–144)
BUN: 16 mg/dL (ref 7–25)
CO2: 28 mmol/L (ref 20–32)
Calcium: 9.6 mg/dL (ref 8.6–10.3)
Chloride: 101 mmol/L (ref 98–110)
Creat: 0.99 mg/dL (ref 0.70–1.25)
GFR, Est African American: 94 mL/min/{1.73_m2} (ref >=60)
GFR, Est Non African American: 81 mL/min/{1.73_m2} (ref >=60)
Globulin: 2.7 g/dL (calc) (ref 1.9–3.7)
Glucose, Bld: 245 mg/dL — ABNORMAL HIGH (ref 65–99)
Potassium: 4.9 mmol/L (ref 3.5–5.3)
Sodium: 137 mmol/L (ref 135–146)
Total Bilirubin: 0.4 mg/dL (ref 0.2–1.2)
Total Protein: 6.8 g/dL (ref 6.1–8.1)

## 2020-11-04 LAB — HEMOGLOBIN A1C
Hgb A1c MFr Bld: 9.1 % of total Hgb — ABNORMAL HIGH (ref <5.7)
Mean Plasma Glucose: 214 mg/dL
eAG (mmol/L): 11.9 mmol/L

## 2020-11-04 LAB — VITAMIN D 25 HYDROXY (VIT D DEFICIENCY, FRACTURES): Vit D, 25-Hydroxy: 48 ng/mL (ref 30–100)

## 2020-11-14 ENCOUNTER — Other Ambulatory Visit: Payer: Self-pay | Admitting: Internal Medicine

## 2020-11-14 DIAGNOSIS — I10 Essential (primary) hypertension: Secondary | ICD-10-CM

## 2020-11-16 ENCOUNTER — Other Ambulatory Visit: Payer: Self-pay | Admitting: Adult Health

## 2020-11-16 ENCOUNTER — Other Ambulatory Visit: Payer: Self-pay | Admitting: Internal Medicine

## 2020-12-16 ENCOUNTER — Encounter: Payer: 59 | Admitting: Adult Health Nurse Practitioner

## 2021-01-18 ENCOUNTER — Encounter: Payer: Self-pay | Admitting: Adult Health Nurse Practitioner

## 2021-01-18 ENCOUNTER — Other Ambulatory Visit: Payer: Self-pay

## 2021-01-18 ENCOUNTER — Ambulatory Visit: Payer: 59 | Admitting: Adult Health Nurse Practitioner

## 2021-01-18 VITALS — BP 126/88 | HR 59 | Temp 97.5°F | Ht 69.0 in | Wt 259.0 lb

## 2021-01-18 DIAGNOSIS — Z0001 Encounter for general adult medical examination with abnormal findings: Secondary | ICD-10-CM

## 2021-01-18 DIAGNOSIS — Z0000 Encounter for general adult medical examination without abnormal findings: Secondary | ICD-10-CM | POA: Diagnosis not present

## 2021-01-18 DIAGNOSIS — E559 Vitamin D deficiency, unspecified: Secondary | ICD-10-CM

## 2021-01-18 DIAGNOSIS — I10 Essential (primary) hypertension: Secondary | ICD-10-CM | POA: Diagnosis not present

## 2021-01-18 DIAGNOSIS — E291 Testicular hypofunction: Secondary | ICD-10-CM

## 2021-01-18 DIAGNOSIS — N182 Chronic kidney disease, stage 2 (mild): Secondary | ICD-10-CM

## 2021-01-18 DIAGNOSIS — Z130 Encounter for screening for diseases of the blood and blood-forming organs and certain disorders involving the immune mechanism: Secondary | ICD-10-CM

## 2021-01-18 DIAGNOSIS — E785 Hyperlipidemia, unspecified: Secondary | ICD-10-CM

## 2021-01-18 DIAGNOSIS — J45909 Unspecified asthma, uncomplicated: Secondary | ICD-10-CM

## 2021-01-18 DIAGNOSIS — E1169 Type 2 diabetes mellitus with other specified complication: Secondary | ICD-10-CM

## 2021-01-18 DIAGNOSIS — N521 Erectile dysfunction due to diseases classified elsewhere: Secondary | ICD-10-CM

## 2021-01-18 DIAGNOSIS — Z136 Encounter for screening for cardiovascular disorders: Secondary | ICD-10-CM | POA: Diagnosis not present

## 2021-01-18 DIAGNOSIS — Z1321 Encounter for screening for nutritional disorder: Secondary | ICD-10-CM

## 2021-01-18 DIAGNOSIS — Z1329 Encounter for screening for other suspected endocrine disorder: Secondary | ICD-10-CM

## 2021-01-18 DIAGNOSIS — Z125 Encounter for screening for malignant neoplasm of prostate: Secondary | ICD-10-CM

## 2021-01-18 DIAGNOSIS — Z79899 Other long term (current) drug therapy: Secondary | ICD-10-CM

## 2021-01-18 DIAGNOSIS — E1122 Type 2 diabetes mellitus with diabetic chronic kidney disease: Secondary | ICD-10-CM

## 2021-01-18 DIAGNOSIS — Z1389 Encounter for screening for other disorder: Secondary | ICD-10-CM

## 2021-01-18 DIAGNOSIS — F17200 Nicotine dependence, unspecified, uncomplicated: Secondary | ICD-10-CM

## 2021-01-18 DIAGNOSIS — J449 Chronic obstructive pulmonary disease, unspecified: Secondary | ICD-10-CM

## 2021-01-18 DIAGNOSIS — E119 Type 2 diabetes mellitus without complications: Secondary | ICD-10-CM

## 2021-01-18 DIAGNOSIS — E782 Mixed hyperlipidemia: Secondary | ICD-10-CM

## 2021-01-18 MED ORDER — METFORMIN HCL ER 500 MG PO TB24: ORAL_TABLET | ORAL | 2 refills | Status: DC

## 2021-01-18 MED ORDER — EZETIMIBE 10 MG PO TABS: 10.0000 mg | ORAL_TABLET | Freq: Every day | ORAL | 1 refills | Status: DC

## 2021-01-18 NOTE — Patient Instructions (Addendum)
STOP using Breo and start using Breztri twice a day.  Rinse mouth after use.    Ask insurance and pharmacy about shingrix - it is a 2 part shot that we will not be getting in the office.   Suggest getting AFTER covid vaccines, have to wait at least a month This shot can make you feel bad due to such good immune response it can trigger some inflammation so take tylenol or aleve day of or day after and plan on resting.   Can go to [email or web address] for more information  Shingrix Vaccination  Two vaccines are licensed and recommended to prevent shingles in the [place] Zoster vaccine live (ZVL, Zostavax) has been in use since 2006. Recombinant zoster vaccine (RZV, Shingrix), has been in use since 2017 and is recommended by ACIP as the preferred shingles vaccine.  What Everyone Should Know about Shingles Vaccine (Shingrix) One of the Recommended Vaccines by Disease Shingles vaccination is the only way to protect against shingles and postherpetic neuralgia (PHN), the most common complication from shingles. CDC recommends that healthy adults 50 years and older get two doses of the shingles vaccine called Shingrix (recombinant zoster vaccine), separated by 2 to 6 months, to prevent shingles and the complications from the disease. Your doctor or pharmacist can give you Shingrix as a shot in your upper arm. Shingrix provides strong protection against shingles and PHN. Two doses of Shingrix is more than 90% effective at preventing shingles and PHN. Protection stays above 85% for at least the first four years after you get vaccinated. Shingrix is the preferred vaccine, over Zostavax (zoster vaccine live), a shingles vaccine in use since 2006. Zostavax may still be used to prevent shingles in healthy adults 60 years and older. For example, you could use Zostavax if a person is allergic to Shingrix, prefers Zostavax, or requests immediate vaccination and Shingrix  is unavailable. Who Should Get Shingrix? Healthy adults 50 years and older should get two doses of Shingrix, separated by 2 to 6 months. You should get Shingrix even if in the past you . had shingles  . received Zostavax  . are not sure if you had chickenpox There is no maximum age for getting Shingrix. If you had shingles in the past, you can get Shingrix to help prevent future occurrences of the disease. There is no specific length of time that you need to wait after having shingles before you can receive Shingrix, but generally you should make sure the shingles rash has gone away before getting vaccinated. You can get Shingrix whether or not you remember having had chickenpox in the past. Studies show that more than 99% of Americans 40 years and older have had chickenpox, even if they don't remember having the disease. Chickenpox and shingles are related because they are caused by the same virus (varicella zoster virus). After a person recovers from chickenpox, the virus stays dormant (inactive) in the body. It can reactivate years later and cause shingles. If you had Zostavax in the recent past, you should wait at least eight weeks before getting Shingrix. Talk to your healthcare provider to determine the best time to get Shingrix. Shingrix is available in [employer] and pharmacies. To find doctor's offices or pharmacies near you that offer the vaccine, visit [employer] Vaccine FinderExternal. If you have questions about Shingrix, talk with your healthcare provider. Vaccine for Those 96 Years and Older  Shingrix reduces the risk of shingles and PHN by more than 90% in people 30 and older.  CDC recommends the vaccine for healthy adults 73 and older.  Who Should Not Get Shingrix? You should not get Shingrix if you: . have ever had a severe allergic reaction to any component of the vaccine or after a dose of Shingrix  . tested negative for immunity to varicella zoster virus. If you test  negative, you should get chickenpox vaccine.  . currently have shingles  . currently are pregnant or breastfeeding. Women who are pregnant or breastfeeding should wait to get Shingrix.  Marland Kitchen receive specific antiviral drugs (acyclovir, famciclovir, or valacyclovir) 24 hours before vaccination (avoid use of these antiviral drugs for 14 days after vaccination)- zoster vaccine live only If you have a minor acute (starts suddenly) illness, such as a cold, you may get Shingrix. But if you have a moderate or severe acute illness, you should usually wait until you recover before getting the vaccine. This includes anyone with a temperature of 101.81F or higher. The side effects of the Shingrix are temporary, and usually last 2 to 3 days. While you may experience pain for a few days after getting Shingrix, the pain will be less severe than having shingles and the complications from the disease. How Well Does Shingrix Work? Two doses of Shingrix provides strong protection against shingles and postherpetic neuralgia (PHN), the most common complication of shingles. . In adults 27 to 63 years old who got two doses, Shingrix was 97% effective in preventing shingles; among adults 70 years and older, Shingrix was 91% effective.  . In adults 65 to 63 years old who got two doses, Shingrix was 91% effective in preventing PHN; among adults 70 years and older, Shingrix was 89% effective. Shingrix protection remained high (more than 85%) in people 70 years and older throughout the four years following vaccination. Since your risk of shingles and PHN increases as you get older, it is important to have strong protection against shingles in your older years. Top of Page  What Are the Possible Side Effects of Shingrix? Studies show that Shingrix is safe. The vaccine helps your body create a strong defense against shingles. As a result, you are likely to have temporary side effects from getting the shots. The side effects may affect  your ability to do normal daily activities for 2 to 3 days. Most people got a sore arm with mild or moderate pain after getting Shingrix, and some also had redness and swelling where they got the shot. Some people felt tired, had muscle pain, a headache, shivering, fever, stomach pain, or nausea. About 1 out of 6 people who got Shingrix experienced side effects that prevented them from doing regular activities. Symptoms went away on their own in about 2 to 3 days. Side effects were more common in younger people. You might have a reaction to the first or second dose of Shingrix, or both doses. If you experience side effects, you may choose to take over-the-counter pain medicine such as ibuprofen or acetaminophen. If you experience side effects from Shingrix, you should report them to the Vaccine Adverse Event Reporting System (VAERS). Your doctor might file this report, or you can do it yourself through the VAERS websiteExternal, or by calling [phone]. If you have any questions about side effects from Shingrix, talk with your doctor. The shingles vaccine does not contain thimerosal (a preservative containing mercury). Top of Page  When Should I See a Doctor Because of the Side Effects I Experience From Shingrix? In clinical trials, Shingrix was not associated with serious  adverse events. In fact, serious side effects from vaccines are extremely rare. For example, for every 1 million doses of a vaccine given, only one or two people may have a severe allergic reaction. Signs of an allergic reaction happen within minutes or hours after vaccination and include hives, swelling of the face and throat, difficulty breathing, a fast heartbeat, dizziness, or weakness. If you experience these or any other life-threatening symptoms, see a doctor right away. Shingrix causes a strong response in your immune system, so it may produce short-term side effects more intense than you are used to from other vaccines.  These side effects can be uncomfortable, but they are expected and usually go away on their own in 2 or 3 days. Top of Page  How Can I Pay For Shingrix? There are several ways shingles vaccine may be paid for: Medicare . Medicare Part D plans cover the shingles vaccine, but there may be a cost to you depending on your plan. There may be a copay for the vaccine, or you may need to pay in full then get reimbursed for a certain amount.  . Medicare Part B does not cover the shingles vaccine. Medicaid . Medicaid may or may not cover the vaccine. Contact your insurer to find out. Private health insurance . Many private health insurance plans will cover the vaccine, but there may be a cost to you depending on your plan. Contact your insurer to find out. Vaccine assistance programs . Some pharmaceutical companies provide vaccines to eligible adults who cannot afford them. You may want to check with the vaccine manufacturer, [employer], about Shingrix. If you do not currently have health insurance, learn more about affordable health coverage optionsExternal. To find doctor's offices or pharmacies near you that offer the vaccine, visit HealthMap Vaccine FinderExternal.

## 2021-01-18 NOTE — Progress Notes (Signed)
COMPLETE PHYSICAL   Assessment and Plan:  Luis Bonilla was seen today for annual exam and medication refill.  Diagnoses and all orders for this visit:  Encounter for general adult medical examination with abnormal findings Yearly  Essential hypertension -     CBC with Differential/Platelet -     COMPLETE METABOLIC PANEL WITH GFR -     TSH -     Magnesium  Chronic obstructive pulmonary disease, unspecified COPD type ([place]) Advised to stop smoking, continue meds, stop Breo Gave samples of Breztri Patient has 100 pack year smoking history Agreed to do low dose CT, no symptoms  Type 2 diabetes mellitus with hyperlipidemia (HCC) Taking Amyryl half tablet BID, Metformin 500mg  two tablets Discussed general issues about diabetes pathophysiology and management., Educational material distributed., Suggested low cholesterol diet., Encouraged aerobic exercise., Discussed foot care., Reminded to get yearly retinal exam. Not at goal Admits to eating poorly Long discussion that with his smoking and DM he is VERY high risk MI/stroke Will think about once a week shot/GLP- info given -     metFORMIN (GLUCOPHAGE-XR) 500 MG 24 hr tablet; Take 2 tablets    2 x day    with Meals for Diabetes -     Hemoglobin A1c   CKD stage 2 due to type 2 diabetes mellitus (HCC) Increase fluids  Avoid NSAIDS Blood pressure control Monitor sugars  Will continue to monitor  Mixed hyperlipidemia -     Lipid panel -     ezetimibe (ZETIA) 10 MG tablet; Take 1 tablet (10 mg total) by mouth daily.  Morbid obesity ([place]) Discussed dietary and exercise modifications  Erectile dysfunction associated with type 2 diabetes mellitus ([place]) Hypogonadism in male Has PRN tadalafil  Uncomplicated asthma, unspecified asthma severity, unspecified whether persistent Continue breo, weight loss advised Patient needs proair, not ventolin  Tobacco use disorder Discussed smoking cessation Not ready to quit at this time Continue  to assess readiness  Medication management Continued  Vitamin D deficiency Continue supplementation to maintain goal of 70-100 Taking Vitamin D 50,000 IU daily -     VITAMIN D 25 Hydroxy (Vit-D Deficiency, Fractures)  Screening PSA (prostate specific antigen) -     PSA  Screening, ischemic heart disease -     EKG 12-Lead  Screening for thyroid disorder -     TSH  Screening, iron deficiency anemia -     Iron,Total/Total Iron Binding Cap  Screening for blood or protein in urine -     Urinalysis w microscopic + reflex cultur  Encounter for vitamin deficiency screening -     Vitamin B12  Other orders -     REFLEXIVE URINE CULTURE   Further disposition pending results if labs check today. Discussed med's effects and SE's.   Over 30 minutes of face to face interview, exam, counseling, chart review, and critical decision making was performed.   Discussed med's effects and SE's. Screening labs and tests as requested with regular follow-up as recommended. Future Appointments  Date Time Provider [place]  04/20/2021 11:00 AM Liane Comber, NP [facility] None  01/18/2022  2:00 PM Liane Comber, NP [facility] None    HPI 65 y.o. male  presents for a complete physical. His blood pressure has been controlled at home, today their BP is BP: 126/88 He does not workout. He denies chest pain, shortness of breath, dizziness.     BMI is Body mass index is 38.25 kg/m., he is working on diet and exercise. Due to his  obesity/low testosterone he has ED and uses cialis PRN.  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  Wt Readings from Last 3 Encounters:  01/18/21 259 lb (117.5 kg)  11/03/20 258 lb (117 kg)  07/13/20 262 lb (118.8 kg)   He has asthma/COPD, is on breo, did not do well with anoro  STATES HE HAD YEAST WITH THIS, and will occ use albuterol, does not want a new med at this time. Has had ventolin in the  past, it is not as affective as proair, patient needs the proair filled.  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 1-2 packs a day for 50 years. Suggest low dose CT, patient will think about it.    Last visit he is complaining of bilateral hand pain, worse in the right hand, has been for years, will not be able to make a fist. He has a weakly positive RF factor, will get Xrays and refer to rheum for evaluation. No other joint pain, no rashes.   He is on cholesterol medication, lovastatin 20mg , was started on zetia last visit but stopped, will restart and denies myalgias. His cholesterol is not at goal, less than 70. The cholesterol last visit was:   Lab Results  Component Value Date   CHOL 136 01/18/2021   HDL 39 (L) 01/18/2021   LDLCALC 78 01/18/2021   TRIG 103 01/18/2021   CHOLHDL 3.5 01/18/2021   He has not been working on diet and exercise for diabetes With hyperlipidemia not at goal denies foot ulcerations, hypoglycemia , paresthesia of the feet, polydipsia, polyuria and visual disturbances.  He is on ACE and on bASA and metformin two pills with supper a day and glimepiride, he is on 1/2mg  BID, has not been checking sugars. Tried invokana but could not tolerate it due to yeast. Last A1C in the office was:  Lab Results  Component Value Date   HGBA1C 8.2 (H) 01/18/2021   Lab Results  Component Value Date   GFRNONAA 93 01/18/2021   Patient is on Vitamin D supplement, on 5000 IU Lab Results  Component Value Date   VD25OH 47 01/18/2021   Lab Results  Component Value Date   PSA 0.22 01/18/2021   PSA 0.2 12/17/2019   PSA 0.3 11/20/2017    Current Medications:  Current Outpatient Medications on File Prior to Visit  Medication Sig Dispense Refill  . Cholecalciferol (VITAMIN D PO) Take 5,000 Int'l Units by mouth daily.    . fluticasone (FLONASE) 50 MCG/ACT nasal spray USE 1 SPRAY(S) INTRANASALLY ONCE A  DAY AS NEEDED  0  . fluticasone furoate-vilanterol (BREO ELLIPTA) 100-25 MCG/INH AEPB INHALE 1 PUFF BY MOUTH DAILY 180 each 0  . glimepiride (AMARYL) 4 MG tablet TAKE 1/2 TO 1 TABLET 2 X /DAY WITH MEALS FOR DIABETES 180 tablet 1  . glucose blood test strip 1 each by Other route as needed for other. Test Blood Sugar Once daily 100 each PRN  . losartan (COZAAR) 50 MG tablet TAKE 1 TABLET DAILY FOR BLOOD PRESSURE 90 tablet 3  . lovastatin (MEVACOR) 20 MG tablet TAKE 1 TABLET BY MOUTH EVERY DAY AT BEDTIME FOR CHOLESTEROL 90 tablet 1  . PROAIR HFA 108 (90 Base) MCG/ACT inhaler INHALE 2 PUFFS INTO THE LUNGS EVERY 6 HOURS AS NEEDED FOR WHEEZING OR SHORTNESS OF BREATH 25.5  g 1  . tadalafil (CIALIS) 20 MG tablet TAKE 1/2 TO 1 TABLET EVERY 2 TO 3 DAYS IF NEEDED FOR XXXX 30 tablet 2  . Vitamin D, Ergocalciferol, (DRISDOL) 1.25 MG (50000 UNIT) CAPS capsule Take 1 capsule (50,000 Units total) by mouth every 7 (seven) days. 1 pill 3 days a week for vitamin d deficiency 16 capsule 1   No current facility-administered medications on file prior to visit.   Immunization History  Administered Date(s) Administered  . Influenza Inj Mdck Quad With Preservative 09/02/2019  . Influenza-Unspecified 08/15/2017  . PFIZER(Purple Top)SARS-COV-2 Vaccination 01/28/2020  . Tdap 01/04/2015   Health Maintenance:  Tetanus: 2016 Pneumovax: declines Prevnar 13: when 67 Flu vaccine: 2020 AT work Zostavax: declines DEXA:N/A Colonoscopy: declines- long discussion, understands risk declines at this time EGD: N/A  Eye Doctor: 2019 [facility]- had canceled appointment last month but rescheduled due to [place]. Overdue Dentist:None, discussed  Medical History:  Past Medical History:  Diagnosis Date  . Asthma   . Erectile dysfunction   . Hyperlipidemia   . Hypertension   . Other testicular hypofunction   . Type II or unspecified type diabetes mellitus without mention of complication, not stated as  uncontrolled    Allergies Allergies  Allergen Reactions  . Flax [Bio-Flax]     SURGICAL HISTORY He  has no past surgical history on file. FAMILY HISTORY His family history includes Cancer in his father; Diabetes in his mother; Hypertension in his mother; Stroke in his mother.  Lung cancer in dad  SOCIAL HISTORY He  reports that he has been smoking. He started smoking about 21 years ago. He has a 40.00 pack-year smoking history. He has never used smokeless tobacco. He reports that he does not drink alcohol and does not use drugs. 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   Review of Systems  Constitutional: Negative.  Negative for chills, diaphoresis, fever, malaise/fatigue and weight loss.  HENT: Negative.  Negative for congestion, ear discharge, ear pain, hearing loss, nosebleeds, sinus pain, sore throat and tinnitus.   Eyes: Negative.  Negative for blurred vision, double vision, photophobia, pain, discharge and redness.  Respiratory: Negative.  Negative for cough, hemoptysis, sputum production, shortness of breath, wheezing and stridor.   Cardiovascular: Negative.  Negative for chest pain, palpitations, orthopnea, claudication, leg swelling and PND.  Gastrointestinal: Negative.  Negative for abdominal pain, blood in stool, constipation, diarrhea, heartburn, melena, nausea and vomiting.  Genitourinary: Negative.  Negative for dysuria, flank pain, frequency, hematuria and urgency.  Musculoskeletal: Negative for back pain, falls, joint pain, myalgias and neck pain.  Skin: Negative for itching and rash.  Neurological: Negative.  Negative for dizziness, tingling, tremors, sensory change, speech change, focal weakness, seizures, loss of consciousness, weakness and headaches.  Endo/Heme/Allergies: Negative.  Negative for environmental allergies and polydipsia. Does not bruise/bleed easily.  Psychiatric/Behavioral: Negative for depression, hallucinations, memory loss, substance abuse and  suicidal ideas. The patient is not nervous/anxious and does not have insomnia.      Physical Exam: Estimated body mass index is 38.25 kg/m as calculated from the following:   Height as of this encounter: 5\' 9"  (1.753 m).   Weight as of this encounter: 259 lb (117.5 kg). Vitals:   01/18/21 1406  BP: 126/88  Pulse: (!) 59  Temp: (!) 97.5 F (36.4 C)  SpO2: 94%   General Appearance: Well nourished, in no apparent distress. Eyes: PERRLA, EOMs, conjunctiva no swelling or erythema, normal fundi and vessels. Sinuses: No  Frontal/maxillary tenderness ENT/Mouth: Ext aud canals clear, normal light reflex with TMs without erythema, bulging. Good dentition. No erythema, swelling, or exudate on post pharynx. Tonsils not swollen or erythematous. Hearing normal.  Neck: Supple, thyroid normal. No bruits Respiratory: Respiratory effort normal, BS equal bilaterally with mild expiratory wheeze without rales, rhonchi, or stridor. Cardio: RRR without murmurs, rubs or gallops. Brisk peripheral pulses without edema.  Chest: symmetric, with normal excursions and percussion. Abdomen: Soft, obese +BS. Non tender, no guarding, rebound, hernias, masses, or organomegaly. .  Lymphatics: Non tender without lymphadenopathy.  Genitourinary: defer Musculoskeletal: Full ROM all peripheral extremities,5/5 strength, and normal gait. Skin: Warm, dry without rashes, lesions, ecchymosis.  Neuro: Cranial nerves intact, reflexes equal bilaterally. Normal muscle tone, no cerebellar symptoms. Sensation intact.  Psych: Awake and oriented X 3, normal affect, Insight and Judgment appropriate.   EKG: WNL no changes.   Garnet Sierras, Laqueta Jean, DNP [facility] 01/18/2021  3:34 PM

## 2021-01-19 LAB — CBC WITH DIFFERENTIAL/PLATELET
Absolute Monocytes: 663 cells/uL (ref 200–950)
Basophils Absolute: 30 cells/uL (ref 0–200)
Basophils Relative: 0.3 %
Eosinophils Absolute: 69 cells/uL (ref 15–500)
Eosinophils Relative: 0.7 %
HCT: 44.7 % (ref 38.5–50.0)
Hemoglobin: 14.8 g/dL (ref 13.2–17.1)
Lymphs Abs: 3128 cells/uL (ref 850–3900)
MCH: 29.2 pg (ref 27.0–33.0)
MCHC: 33.1 g/dL (ref 32.0–36.0)
MCV: 88.3 fL (ref 80.0–100.0)
MPV: 9.9 fL (ref 7.5–12.5)
Monocytes Relative: 6.7 %
Neutro Abs: 6009 cells/uL (ref 1500–7800)
Neutrophils Relative %: 60.7 %
Platelets: 186 10*3/uL (ref 140–400)
RBC: 5.06 10*6/uL (ref 4.20–5.80)
RDW: 12.6 % (ref 11.0–15.0)
Total Lymphocyte: 31.6 %
WBC: 9.9 10*3/uL (ref 3.8–10.8)

## 2021-01-19 LAB — VITAMIN D 25 HYDROXY (VIT D DEFICIENCY, FRACTURES): Vit D, 25-Hydroxy: 47 ng/mL (ref 30–100)

## 2021-01-19 LAB — COMPLETE METABOLIC PANEL WITH GFR
AG Ratio: 1.5 (calc) (ref 1.0–2.5)
ALT: 11 U/L (ref 9–46)
AST: 9 U/L — ABNORMAL LOW (ref 10–35)
Albumin: 4.3 g/dL (ref 3.6–5.1)
Alkaline phosphatase (APISO): 63 U/L (ref 35–144)
BUN: 15 mg/dL (ref 7–25)
CO2: 31 mmol/L (ref 20–32)
Calcium: 9.7 mg/dL (ref 8.6–10.3)
Chloride: 103 mmol/L (ref 98–110)
Creat: 0.86 mg/dL (ref 0.70–1.25)
GFR, Est African American: 108 mL/min/{1.73_m2} (ref >=60)
GFR, Est Non African American: 93 mL/min/{1.73_m2} (ref >=60)
Globulin: 2.9 g/dL (calc) (ref 1.9–3.7)
Glucose, Bld: 166 mg/dL — ABNORMAL HIGH (ref 65–99)
Potassium: 5.1 mmol/L (ref 3.5–5.3)
Sodium: 138 mmol/L (ref 135–146)
Total Bilirubin: 0.4 mg/dL (ref 0.2–1.2)
Total Protein: 7.2 g/dL (ref 6.1–8.1)

## 2021-01-19 LAB — LIPID PANEL
Cholesterol: 136 mg/dL (ref <200)
HDL: 39 mg/dL — ABNORMAL LOW (ref >=40)
LDL Cholesterol (Calc): 78 mg/dL (calc)
Non-HDL Cholesterol (Calc): 97 mg/dL (calc) (ref <130)
Total CHOL/HDL Ratio: 3.5 (calc) (ref <5.0)
Triglycerides: 103 mg/dL (ref <150)

## 2021-01-19 LAB — URINALYSIS W MICROSCOPIC + REFLEX CULTURE
Bacteria, UA: NONE SEEN /HPF
Bilirubin Urine: NEGATIVE
Glucose, UA: NEGATIVE
Hgb urine dipstick: NEGATIVE
Hyaline Cast: NONE SEEN /LPF
Ketones, ur: NEGATIVE
Leukocyte Esterase: NEGATIVE
Nitrites, Initial: NEGATIVE
Protein, ur: NEGATIVE
RBC / HPF: NONE SEEN /HPF (ref 0–2)
Specific Gravity, Urine: 1.014 (ref 1.001–1.03)
Squamous Epithelial / HPF: NONE SEEN /HPF (ref <=5)
WBC, UA: NONE SEEN /HPF (ref 0–5)
pH: <=5 (ref 5.0–8.0)

## 2021-01-19 LAB — TSH: TSH: 2.95 mIU/L (ref 0.40–4.50)

## 2021-01-19 LAB — IRON, TOTAL/TOTAL IRON BINDING CAP
%SAT: 20 % (calc) (ref 20–48)
Iron: 76 ug/dL (ref 50–180)
TIBC: 371 mcg/dL (calc) (ref 250–425)

## 2021-01-19 LAB — HEMOGLOBIN A1C
Hgb A1c MFr Bld: 8.2 % of total Hgb — ABNORMAL HIGH (ref <5.7)
Mean Plasma Glucose: 189 mg/dL
eAG (mmol/L): 10.4 mmol/L

## 2021-01-19 LAB — MAGNESIUM: Magnesium: 1.7 mg/dL (ref 1.5–2.5)

## 2021-01-19 LAB — NO CULTURE INDICATED

## 2021-01-19 LAB — VITAMIN B12: Vitamin B-12: 255 pg/mL (ref 200–1100)

## 2021-01-19 LAB — PSA: PSA: 0.22 ng/mL (ref <=4.0)

## 2021-01-27 ENCOUNTER — Other Ambulatory Visit: Payer: Self-pay

## 2021-01-27 MED ORDER — VITAMIN D (ERGOCALCIFEROL) 1.25 MG (50000 UNIT) PO CAPS: 50000.0000 [IU] | ORAL_CAPSULE | ORAL | 1 refills | Status: DC

## 2021-01-28 ENCOUNTER — Other Ambulatory Visit: Payer: Self-pay | Admitting: Internal Medicine

## 2021-01-28 MED ORDER — VITAMIN D (ERGOCALCIFEROL) 1.25 MG (50000 UNIT) PO CAPS
ORAL_CAPSULE | ORAL | 1 refills | Status: DC
Start: 2021-01-28 — End: 2021-04-20

## 2021-01-31 ENCOUNTER — Other Ambulatory Visit: Payer: Self-pay | Admitting: Adult Health

## 2021-02-23 ENCOUNTER — Other Ambulatory Visit: Payer: Self-pay | Admitting: Adult Health

## 2021-02-23 DIAGNOSIS — E119 Type 2 diabetes mellitus without complications: Secondary | ICD-10-CM

## 2021-04-01 ENCOUNTER — Other Ambulatory Visit: Payer: Self-pay | Admitting: Adult Health

## 2021-04-20 ENCOUNTER — Other Ambulatory Visit: Payer: Self-pay

## 2021-04-20 ENCOUNTER — Ambulatory Visit: Payer: 59 | Admitting: Adult Health

## 2021-04-20 ENCOUNTER — Encounter: Payer: Self-pay | Admitting: Adult Health

## 2021-04-20 VITALS — BP 104/78 | HR 55 | Temp 97.0°F | Wt 256.6 lb

## 2021-04-20 DIAGNOSIS — E785 Hyperlipidemia, unspecified: Secondary | ICD-10-CM

## 2021-04-20 DIAGNOSIS — E1122 Type 2 diabetes mellitus with diabetic chronic kidney disease: Secondary | ICD-10-CM

## 2021-04-20 DIAGNOSIS — E559 Vitamin D deficiency, unspecified: Secondary | ICD-10-CM

## 2021-04-20 DIAGNOSIS — N521 Erectile dysfunction due to diseases classified elsewhere: Secondary | ICD-10-CM

## 2021-04-20 DIAGNOSIS — J449 Chronic obstructive pulmonary disease, unspecified: Secondary | ICD-10-CM | POA: Diagnosis not present

## 2021-04-20 DIAGNOSIS — I10 Essential (primary) hypertension: Secondary | ICD-10-CM

## 2021-04-20 DIAGNOSIS — M79641 Pain in right hand: Secondary | ICD-10-CM

## 2021-04-20 DIAGNOSIS — Z79899 Other long term (current) drug therapy: Secondary | ICD-10-CM

## 2021-04-20 DIAGNOSIS — F17210 Nicotine dependence, cigarettes, uncomplicated: Secondary | ICD-10-CM

## 2021-04-20 DIAGNOSIS — N182 Chronic kidney disease, stage 2 (mild): Secondary | ICD-10-CM

## 2021-04-20 DIAGNOSIS — G8929 Other chronic pain: Secondary | ICD-10-CM

## 2021-04-20 DIAGNOSIS — E1169 Type 2 diabetes mellitus with other specified complication: Secondary | ICD-10-CM

## 2021-04-20 DIAGNOSIS — Z5320 Procedure and treatment not carried out because of patient's decision for unspecified reasons: Secondary | ICD-10-CM

## 2021-04-20 MED ORDER — VITAMIN D (ERGOCALCIFEROL) 1.25 MG (50000 UNIT) PO CAPS: ORAL_CAPSULE | ORAL | 3 refills | Status: AC

## 2021-04-20 MED ORDER — ROSUVASTATIN CALCIUM 5 MG PO TABS: 5.0000 mg | ORAL_TABLET | Freq: Every day | ORAL | 3 refills | Status: DC

## 2021-04-20 NOTE — Patient Instructions (Signed)
Once you run out of lovastatin/zetia please reach out for rosuvastatin 5 mg script that we discussed. Watch for any unusual muscle ache/joint pains and let me know if this happens.   Please get hand xray at [address] imaging center  Can walk in without appointment 8am-4 pm M-F      Rosuvastatin Tablets What is this medication? ROSUVASTATIN (roe SOO va sta tin) treats high cholesterol and reduces the risk of heart attack and stroke. It works by decreasing bad cholesterol and fats (such as LDL, triglycerides), and increasing good cholesterol (HDL) in your blood. It belongs to a group of medications called statins. Changes to diet andexercise are often combined with this medication. This medicine may be used for other purposes; ask your health care provider orpharmacist if you have questions. COMMON BRAND NAME(S): Crestor What should I tell my care team before I take this medication? They need to know if you have any of these conditions: Diabetes (high blood sugar) If you often drink alcohol Kidney disease Liver disease Muscle cramps, pain Stroke Thyroid disease An unusual or allergic reaction to rosuvastatin, other medications, foods, dyes, or preservatives Pregnant or trying to get pregnant Breast-feeding How should I use this medication? Take this medication by mouth with a glass of water. Follow the directions on the prescription label. You can take it with or without food. If it upsets your stomach, take it with food. Do not cut, crush or chew this medication. Swallow the tablets whole. Take your medication at regular intervals. Do not take itmore often than directed. Take antacids that have a combination of aluminum and magnesium hydroxide in them at a different time of day than this medication. Take these products 2hours AFTER this medication. Talk to your care team about the use of this medication in children. While this medication may be prescribed for children as young  as 7 for selectedconditions, precautions do apply. Overdosage: If you think you have taken too much of this medicine contact apoison control center or emergency room at once. NOTE: This medicine is only for you. Do not share this medicine with others. What if I miss a dose? If you miss a dose, take it as soon as you can. If your next dose is to be taken in less than 12 hours, then do not take the missed dose. Take the nextdose at your regular time. Do not take double or extra doses. What may interact with this medication? Do not take this medication with any of the following: Supplements like red yeast rice This medication may also interact with the following: Alcohol Antacids containing aluminum hydroxide and magnesium hydroxide Cyclosporine Other medications for high cholesterol Some medications for HIV infection Warfarin This list may not describe all possible interactions. Give your health care provider a list of all the medicines, herbs, non-prescription drugs, or dietary supplements you use. Also tell them if you smoke, drink alcohol, or use illegaldrugs. Some items may interact with your medicine. What should I watch for while using this medication? Visit your health care provider for regular checks on your progress. Tell your health care provider if your symptoms do not start to get better or if they getworse. Your health care provider may tell you to stop taking this medication if you develop muscle problems. If your muscle problems do not go away after stoppingthis medication, contact your health care provider. Do not become pregnant while taking this medication. Women should inform their health care provider if they wish  to become pregnant or think they might be pregnant. There is potential for serious harm to an unborn child. Talk to your health care provider for more information. Do not breast-feed an infant whiletaking this medication. This medication may increase blood sugar. Ask  your health care provider ifchanges in diet or medications are needed if you have diabetes. If you are going to need surgery or other procedure, tell your health careprovider that you are using this medication. Taking this medication is only part of a total heart healthy program. Your health care provider may give you a special diet to follow. Avoid alcohol.Avoid smoking. Ask your health care provider how much you should exercise. What side effects may I notice from receiving this medication? Side effects that you should report to your care team as soon as possible: Allergic reactions-skin rash, itching, hives, swelling of the face, lips, tongue, or throat High blood sugar (hyperglycemia)-increased thirst or amount of urine, unusual weakness, fatigue, blurry vision Liver injury-right upper belly pain, loss of appetite, nausea, light-colored stool, dark yellow or brown urine, yellowing skin or eyes, unusual weakness, fatigue Muscle injury-unusual weakness, fatigue, muscle pain, dark yellow or brown urine, decrease in amount of urine Redness, blistering, peeling or loosening of the skin, including inside the mouth Side effects that usually do not require medical attention (report to your careteam if they continue or are bothersome): Fatigue Headache Nausea Stomach pain This list may not describe all possible side effects. Call your doctor for medical advice about side effects. You may report side effects to FDA at1-800-FDA-1088. Where should I keep my medication? Keep out of the reach of children and pets. Store between 20 and 25 degrees C (68 and 77 degrees F). Get rid of any unusedmedication after the expiration date. To get rid of medications that are no longer needed or have expired: Take the medication to a medication take-back program. Check with your pharmacy or law enforcement to find a location. If you cannot return the medication, check the label or package insert to see if the  medication should be thrown out in the garbage or flushed down the toilet. If you are not sure, ask your care team. If it is safe to put it in the trash, take the medication out of the container. Mix the medication with cat litter, dirt, coffee grounds, or other unwanted substance. Seal the mixture in a bag or container. Put it in the trash. NOTE: This sheet is a summary. It may not cover all possible information. If you have questions about this medicine, talk to your doctor, pharmacist, orhealth care provider.  2022 [employer]vier/Gold Standard (2020-11-05 12:30:47)

## 2021-04-20 NOTE — Progress Notes (Signed)
FOLLOW UP 3 MONTH  Assessment and Plan:  Essential hypertension - continue medications, DASH diet, exercise and monitor at home. Call if greater than 130/80.  -     CBC with Differential/Platelet -     CMP/GFR -     TSH  Chronic obstructive pulmonary disease, unspecified COPD type ([place]) Advised to stop smoking, continue meds/Breo. May add mucinex if needed. He didn't like trelegy/breztri. Consider nebs if needed.  Patient has 40+ pack year smoking hx. Discussed progressive nature with continued smoking.   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 - discussed LDL goal <70 - currently on low intensity statin and zetia, interested in pill reduction, denies hx of previous statin intolerance - discussed rosuvastatin 5 mg to start once he completes recently refilled meds, agreement with plan  -    Lipid panel   Erectile dysfunction associated with type 2 diabetes mellitus ([facility]) Control sugars, cialis PRN works well   Tobacco use disorder (40 + pack year current smoker) Smoking cessation-  instruction/counseling given, counseled patient on the dangers of tobacco use, advised patient to stop smoking, and reviewed strategies to maximize success, patient not ready to quit at this time.   Declines lung cancer screening -lung cancer screening with low dose CT discussed as recommended by guidelines based on age, number of pack year history.  Discussed risks of  screening including but not limited to false positives on xray, further testing or consultation with specialist, and possible false negative CT as well. Benefits of early detection of lung cancer discussed. He is not interested in pursuing any lung cancer screening at this time.   Medication management -     Magnesium  Vitamin D deficiency Restart once weekly 50000 IU per patient preference, was well controlled with this dose per history review.   Morbid obesity ([place]) - follow up 4 months for progress monitoring - increase veggies, decrease carbs - long discussion about weight loss, diet, and exercise   Discussed med's effects and SE's. Screening labs and tests as requested with regular follow-up as recommended. Future Appointments  Date Time Provider [place]  01/18/2022  2:00 PM Liane Comber, NP [facility] None    HPI 64 y.o. male  presents for a three motnh follow up on HTN, HLD, T2DM, morbid obesity, smoking, vitamin D def.   He is a heavy smoker, 1-2 packs for many years with 40+ pack year hisotry. He has asthma/COPD, is on breo, did not do well with anoro (also reports did try trellegy ellipta, breztri, didn't like). He also will use albuterol (prefers proair > ventolin however insurance prefers ventolin, he pays out of pocket), tends to need when driving to drastically different temps.    He is not interested in smoking cessation; cannot take chantix "because of DOT" (also currently off of market), reports patches didn't help. Last lung imaging was CXR in 2009, hyperinflation. Low dose  CT has been discussed per previous provider notes, never completed. Discussed at length today, risks/benefits. Patient reports he is not interested in this, "I wouldn't want to know" despite benefits of early detection.   He does note ongoing R MCP joint stiffness/pain, denies injury, takes aleve occasionally. He had negative ANA, Anti DNA with weak positive RF of 16 on 09/02/2019, recheck  RF 04/06/2020 was normal at 14. Denies symmetrical joint pain, fatigue, widespread joint concerns. Denies family hx of autoimmune. He was referred to rheum 11/2019 but never went due to pain improved at that time. He has not had imaging for this. Interested in pursuing this today.    BMI is Body mass index is 37.89 kg/m., he has not been working on diet and exercise other than cutting out sweets in the last 5 weeks. Due to his obesity/low testosterone he has ED and uses cialis PRN.   Wt Readings from Last 3 Encounters:  04/20/21 256 lb 9.6 oz (116.4 kg)  01/18/21 259 lb (117.5 kg)  11/03/20 258 lb (117 kg)    His blood pressure has been controlled at home, today their BP is BP: 104/78 He does not workout. He denies chest pain, shortness of breath, dizziness.   He is on cholesterol medication, lovastatin 20mg  and zetia 10mg  and denies myalgias.  His cholesterol is not at goal, less than 70. The cholesterol last visit was:   Lab Results  Component Value Date   CHOL 136 01/18/2021   HDL 39 (L) 01/18/2021   LDLCALC 78 01/18/2021   TRIG 103 01/18/2021   CHOLHDL 3.5 01/18/2021   He has not been working on diet and exercise for diabetes. 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  With hyperlipidemia not at LDL goal, is on statin denies foot ulcerations, hypoglycemia , paresthesia of the feet, polydipsia, polyuria and visual disturbances.  He is on ACE and on bASA  On metformin two pills with supper a day (had diarrhea with 4 tabs/day even spread) and glimepiride 4 he is on 1/2 mg BID Rarely checks sugars, has not been checking sugars.  Tried invokana but could not tolerate it due to yeast. Last A1C in the office was:  Lab Results  Component Value Date   HGBA1C 8.2 (H) 01/18/2021   CKD II  Lab Results  Component Value Date   GFRNONAA 93 01/18/2021   Patient is on Vitamin D supplement, was on 50000 IU weekly but did run out after last OV.  Lab Results  Component Value Date    VD25OH 47 01/18/2021    Current Medications:   Current Outpatient Medications (Endocrine & Metabolic):    glimepiride (AMARYL) 4 MG tablet, TAKE 1/2 TO 1 TABLET 2 X /DAY WITH MEALS FOR DIABETES   metFORMIN (GLUCOPHAGE-XR) 500 MG 24 hr tablet, Take 2 tablets    2 x day    with Meals for Diabetes  Current Outpatient Medications (Cardiovascular):    ezetimibe (ZETIA) 10 MG tablet, Take 1 tablet (10 mg total) by mouth daily.   losartan (COZAAR) 50 MG tablet, TAKE 1 TABLET DAILY FOR BLOOD PRESSURE   lovastatin (MEVACOR) 20 MG tablet, TAKE 1 TABLET BY MOUTH EVERY DAY AT BEDTIME FOR CHOLESTEROL   tadalafil (CIALIS) 20 MG tablet, TAKE 1/2 TO 1 TABLET EVERY 2 TO 3 DAYS IF NEEDED FOR XXXX  Current Outpatient Medications (Respiratory):    albuterol (PROAIR HFA) 108 (90 Base) MCG/ACT inhaler, Use  2 inhalations  15 minutes  apart every  4 hours  as needed to Rescue   fluticasone (FLONASE) 50 MCG/ACT nasal spray, USE 1 SPRAY(S) INTRANASALLY ONCE A DAY AS NEEDED   fluticasone furoate-vilanterol (BREO ELLIPTA) 100-25 MCG/INH AEPB, INHALE 1 PUFF BY MOUTH EVERY DAY    Current Outpatient Medications (Other):    glucose blood test strip, 1 each by Other route as needed for other. Test Blood Sugar Once daily   Cholecalciferol (VITAMIN D PO), Take 5,000 Int'l Units by mouth daily. (Patient not taking: Reported on 04/20/2021)   Vitamin D, Ergocalciferol, (DRISDOL) 1.25 MG (50000 UNIT) CAPS capsule, 1 capsule  3 days /week for Vitamin D Deficiency (Patient not taking: Reported on 04/20/2021)  Medical History:  Past Medical History:  Diagnosis Date   Asthma    Erectile dysfunction    Hyperlipidemia    Hypertension    Other testicular hypofunction    Type II or unspecified type diabetes mellitus without mention of complication, not stated as uncontrolled    Allergies Allergies  Allergen Reactions   Flax [Bio-Flax]    Surgical History: reviewed and unchanged Family History: reviewed and  unchanged Social History: reviewed and unchanged 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   Review of Systems  Constitutional:  Negative for malaise/fatigue and weight loss.  HENT:  Negative for hearing loss and tinnitus.   Eyes:  Negative for blurred vision and double vision.  Respiratory:  Positive for shortness of breath (with heat/humidity changes). Negative for cough, sputum production and wheezing.   Cardiovascular:  Negative for chest pain, palpitations, orthopnea, claudication and leg swelling.  Gastrointestinal:  Negative for abdominal pain, blood in stool, constipation, diarrhea, heartburn, melena, nausea and vomiting.  Genitourinary: Negative.   Musculoskeletal:  Positive for joint pain (R MCP joint intermittently, stiffness). Negative for myalgias.  Skin:  Negative for rash.  Neurological:  Negative for dizziness, tingling, sensory change, weakness and headaches.  Endo/Heme/Allergies:  Negative for polydipsia.  Psychiatric/Behavioral: Negative.    All other systems reviewed and are negative.   Physical Exam: Estimated body mass index is 37.89 kg/m as calculated from the following:   Height as of 01/18/21: 5\' 9"  (1.753 m).   Weight as of this encounter: 256 lb 9.6 oz (116.4 kg). Vitals:   04/20/21 1106  BP: 104/78  Pulse: (!) 55  Temp: (!) 97 F (36.1 C)  SpO2: 97%   General Appearance: Well nourished, in no apparent distress. Eyes: PERRLA, EOMs, conjunctiva no swelling or erythema, normal fundi and vessels. Sinuses: No Frontal/maxillary tenderness ENT/Mouth: Ext aud canals clear, normal light reflex with TMs without erythema, bulging. Good dentition. No erythema, swelling, or exudate on post pharynx. Tonsils not swollen or erythematous. Hearing normal.  Neck: Supple, thyroid normal. No bruits Respiratory: Respiratory effort normal, BS equal bilaterally with mild expiratory wheeze without rales, rhonchi, or stridor. Cardio: RRR without murmurs, rubs or gallops.  Brisk peripheral pulses without edema.  Abdomen: Soft, obese +BS. Non tender, no guarding, rebound, hernias, masses, or organomegaly. .  Lymphatics: Non tender without lymphadenopathy.  Musculoskeletal: Symmetrical ROM excepting R hand MCP joint with stiffness, no effusion or tenderness, reduced flexion, unable to make closed fist. No palpable nodules. Distal neurovasc intact.  Skin: Warm, dry without rashes, lesions, ecchymosis.  Neuro: Cranial nerves intact, reflexes equal bilaterally. Normal muscle tone, no cerebellar symptoms. Sensation intact.  Psych: Awake and oriented X 3, normal affect, Insight and Judgment appropriate.   Izora Ribas, NP 12:50 PM [facility]

## 2021-04-21 LAB — CBC WITH DIFFERENTIAL/PLATELET
Absolute Monocytes: 495 cells/uL (ref 200–950)
Basophils Absolute: 23 cells/uL (ref 0–200)
Basophils Relative: 0.3 %
Eosinophils Absolute: 68 cells/uL (ref 15–500)
Eosinophils Relative: 0.9 %
HCT: 43.6 % (ref 38.5–50.0)
Hemoglobin: 14.2 g/dL (ref 13.2–17.1)
Lymphs Abs: 2213 cells/uL (ref 850–3900)
MCH: 29 pg (ref 27.0–33.0)
MCHC: 32.6 g/dL (ref 32.0–36.0)
MCV: 89.2 fL (ref 80.0–100.0)
MPV: 10 fL (ref 7.5–12.5)
Monocytes Relative: 6.6 %
Neutro Abs: 4703 cells/uL (ref 1500–7800)
Neutrophils Relative %: 62.7 %
Platelets: 175 10*3/uL (ref 140–400)
RBC: 4.89 10*6/uL (ref 4.20–5.80)
RDW: 12.9 % (ref 11.0–15.0)
Total Lymphocyte: 29.5 %
WBC: 7.5 10*3/uL (ref 3.8–10.8)

## 2021-04-21 LAB — COMPLETE METABOLIC PANEL WITH GFR
AG Ratio: 1.6 (calc) (ref 1.0–2.5)
ALT: 13 U/L (ref 9–46)
AST: 12 U/L (ref 10–35)
Albumin: 4.2 g/dL (ref 3.6–5.1)
Alkaline phosphatase (APISO): 66 U/L (ref 35–144)
BUN: 12 mg/dL (ref 7–25)
CO2: 30 mmol/L (ref 20–32)
Calcium: 9.2 mg/dL (ref 8.6–10.3)
Chloride: 103 mmol/L (ref 98–110)
Creat: 0.87 mg/dL (ref 0.70–1.25)
GFR, Est African American: 106 mL/min/{1.73_m2} (ref >=60)
GFR, Est Non African American: 92 mL/min/{1.73_m2} (ref >=60)
Globulin: 2.6 g/dL (calc) (ref 1.9–3.7)
Glucose, Bld: 118 mg/dL — ABNORMAL HIGH (ref 65–99)
Potassium: 4.5 mmol/L (ref 3.5–5.3)
Sodium: 138 mmol/L (ref 135–146)
Total Bilirubin: 0.5 mg/dL (ref 0.2–1.2)
Total Protein: 6.8 g/dL (ref 6.1–8.1)

## 2021-04-21 LAB — LIPID PANEL
Cholesterol: 107 mg/dL (ref <200)
HDL: 28 mg/dL — ABNORMAL LOW (ref >=40)
LDL Cholesterol (Calc): 61 mg/dL (calc)
Non-HDL Cholesterol (Calc): 79 mg/dL (calc) (ref <130)
Total CHOL/HDL Ratio: 3.8 (calc) (ref <5.0)
Triglycerides: 98 mg/dL (ref <150)

## 2021-04-21 LAB — HEMOGLOBIN A1C
Hgb A1c MFr Bld: 7.6 % of total Hgb — ABNORMAL HIGH (ref <5.7)
Mean Plasma Glucose: 171 mg/dL
eAG (mmol/L): 9.5 mmol/L

## 2021-04-21 LAB — MAGNESIUM: Magnesium: 1.8 mg/dL (ref 1.5–2.5)

## 2021-04-21 LAB — TSH: TSH: 2.79 mIU/L (ref 0.40–4.50)

## 2021-05-06 ENCOUNTER — Other Ambulatory Visit: Payer: Self-pay | Admitting: Adult Health

## 2021-05-12 ENCOUNTER — Other Ambulatory Visit: Payer: Self-pay

## 2021-05-12 ENCOUNTER — Ambulatory Visit
Admission: RE | Admit: 2021-05-12 | Discharge: 2021-05-12 | Disposition: A | Discharge status: Home / Self Care | Payer: 59 | Source: Ambulatory Visit | Attending: Adult Health | Admitting: Adult Health

## 2021-05-12 DIAGNOSIS — G8929 Other chronic pain: Secondary | ICD-10-CM

## 2021-05-12 DIAGNOSIS — M79641 Pain in right hand: Secondary | ICD-10-CM

## 2021-05-14 ENCOUNTER — Other Ambulatory Visit: Payer: Self-pay | Admitting: Adult Health

## 2021-05-14 DIAGNOSIS — M19041 Primary osteoarthritis, right hand: Secondary | ICD-10-CM

## 2021-05-19 ENCOUNTER — Other Ambulatory Visit: Payer: Self-pay | Admitting: Adult Health

## 2021-05-19 DIAGNOSIS — E785 Hyperlipidemia, unspecified: Secondary | ICD-10-CM

## 2021-05-19 DIAGNOSIS — E782 Mixed hyperlipidemia: Secondary | ICD-10-CM

## 2021-06-08 ENCOUNTER — Encounter: Payer: Self-pay | Admitting: Orthopaedic Surgery

## 2021-06-08 ENCOUNTER — Other Ambulatory Visit: Payer: Self-pay

## 2021-06-08 ENCOUNTER — Ambulatory Visit: Payer: 59 | Admitting: Orthopaedic Surgery

## 2021-06-08 DIAGNOSIS — M19042 Primary osteoarthritis, left hand: Secondary | ICD-10-CM

## 2021-06-08 NOTE — [place] Notes (Signed)
Office Visit Note   Patient: Luis Bonilla           Date of Birth: 24-Nov-1955           MRN: [id number] Visit Date: 06/08/2021              Requested by: Liane Comber, NP [address] [place],  [place] [address] PCP: Unk Pinto, MD   Assessment & Plan: Visit Diagnoses:  1. Primary osteoarthritis of left hand     Plan: The patient understands that he does have osteoarthritis of the middle finger MCP joint on his right hand.  I recommended Voltaren gel and at some point he may want to consider an ultrasound-guided steroid injection in that MCP joint.  We can always refer him to hand specialist to consider an arthroplasty if he gets worse for him.  He says is usually worse in the cold weather months and right now he would try Voltaren gel first.  All questions and concerns were answered and addressed.  Follow-up can be as needed.  Follow-Up Instructions: Return if symptoms worsen or fail to improve.   Orders:  No orders of the defined types were placed in this encounter.  No orders of the defined types were placed in this encounter.     Procedures: No procedures performed   Clinical Data: No additional findings.   Subjective: Chief Complaint  Patient presents with   Right Hand - Pain  The patient is a very pleasant right-hand-dominant gentleman who comes in with 15 years of worsening right hand pain and he points to the middle finger MCP joint.  He has had no known injury but it hurts when he has been gripping the steering wheel for long period time.  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  He denies any trauma to the hand.  He denies any numbness and tingling.  HPI  Review of Systems There is currently listed no headache, chest pain, shortness of breath, fever, chills, nausea, vomiting  Objective: Vital Signs: There were no vitals taken for this visit.  Physical Exam He is alert and orient x3 and in no acute  distress Ortho Exam Examination of his right hand does show swelling at the MCP joint of the middle finger.  There is no triggering or locking.  His grip and pinch strength are good.  His finger is well-perfused and all fingers have normal sensation.  Specialty Comments:  No specialty comments available.  Imaging: No results found. Several views independently reviewed of the right hand show arthritic changes of the MCP joint of the middle finger with joint space narrowing, peritubular osteophytes and irregularities of the metacarpal head consistent with osteoarthritis.  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 mellitus ([place]) 01/04/2015   Tobacco use disorder 01/04/2015   Erectile dysfunction associated with type 2 diabetes mellitus ([place]) 01/04/2015   Morbid obesity ([place]) 12/29/2013   Hypertension    Asthma    Hypogonadism in male  Past Medical History:  Diagnosis Date   Asthma    Erectile dysfunction    Hyperlipidemia    Hypertension    Other testicular hypofunction    Type II or unspecified type diabetes mellitus without mention of complication, not stated as uncontrolled     Family History  Problem Relation Age of Onset   Stroke Mother    Hypertension Mother    Diabetes Mother    Cancer Father        lung    History reviewed. No pertinent surgical history. Social History   Occupational History   Not on file  Tobacco Use   Smoking status: Every Day    Packs/day: 2.00    Years: 21.00    Pack years: 42.00    Types:  Cigarettes    Start date: 10/24/1999   Smokeless tobacco: Never  Substance and Sexual Activity   Alcohol use: No   Drug use: No   Sexual activity: Not on file

## 2021-07-12 ENCOUNTER — Other Ambulatory Visit: Payer: Self-pay | Admitting: Adult Health Nurse Practitioner

## 2021-07-12 DIAGNOSIS — E782 Mixed hyperlipidemia: Secondary | ICD-10-CM

## 2021-07-18 ENCOUNTER — Other Ambulatory Visit: Payer: Self-pay | Admitting: Internal Medicine

## 2021-07-18 DIAGNOSIS — E1169 Type 2 diabetes mellitus with other specified complication: Secondary | ICD-10-CM

## 2021-07-18 DIAGNOSIS — N521 Erectile dysfunction due to diseases classified elsewhere: Secondary | ICD-10-CM

## 2021-07-25 NOTE — Progress Notes (Signed)
FOLLOW UP 3 MONTH  Assessment and Plan:  Essential hypertension - continue medications, DASH diet, exercise and monitor at home. Call if greater than 130/80.  -     CBC with Differential/Platelet -     CMP/GFR -     TSH  Chronic obstructive pulmonary disease, unspecified COPD type ([place]) Advised to stop smoking, continue meds/Breo. May add mucinex if needed.  Patient has 40+ pack year smoking hx. Discussed progressive nature with continued smoking.    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 - discussed LDL goal <70 - Currently taking lovastatin 20 mg and zetia 10 mg and per Liane Comber NP previous plan will be switching to Crestor 5 mg QD -    Lipid panel   OSA with CPAP Has been wearing CPAP regularly since July after sleep study revealed OSA  Erectile dysfunction associated with type 2 diabetes mellitus (HCC) Control sugars, cialis PRN works well   Tobacco use disorder (40 + pack year current smoker) Smoking cessation-  instruction/counseling given, counseled patient on the dangers of tobacco use, advised patient to stop smoking, and reviewed strategies to maximize success, patient not ready to quit at this time.   Declines lung cancer screening -lung cancer screening with low dose CT discussed as recommended by  guidelines based on age, number of pack year history.  Discussed risks of screening including but not limited to false positives on xray, further testing or consultation with specialist, and possible false negative CT as well. Benefits of early detection of lung cancer discussed. He is not interested in pursuing any lung cancer screening at this time.   Medication management -     Microalbumin/creatinine urine ratio  Vitamin D deficiency Restart once weekly 50000 IU per patient preference, was well controlled with this dose per history review.   Morbid obesity ([place]) - follow up 4 months for progress monitoring - increase veggies, decrease carbs - long discussion about weight loss, diet, and exercise   Discussed med's effects and SE's. Screening labs and tests as requested with regular follow-up as recommended. Future Appointments  Date Time Provider [place]  01/18/2022  2:00 PM Liane Comber, NP [facility] None    HPI 65 y.o. male  presents for a three motnh follow up on HTN, HLD, T2DM, morbid obesity, smoking, vitamin D def.   He is a heavy smoker, 1-2 packs for many years with 40+ pack year hisotry. He has asthma/COPD, is on breo, did not do well with anoro (also reports did try trellegy ellipta, breztri, didn't like). Continues to use Albuterol PRN.  Has morning productive cough of white mucus from smoking and mouth dryness from CPAP.   He is not interested in smoking cessation; cannot take chantix "because of DOT" (also currently off of market), reports patches didn't  help. Last lung imaging was CXR in 2009, hyperinflation. Continues to refuse lung CA screening  Right hand shows arthritis in middle finger MCP joint, saw orthopedics. Using Voltaren cream suggested by Dr. Ninfa Linden   BMI is Body mass index is 37.63 kg/m., he has not been working on diet and exercise other than cutting out sweets in the last 5 weeks. Due to his obesity/low testosterone he has ED and uses  cialis PRN.   Wt Readings from Last 3 Encounters:  07/26/21 254 lb 12.8 oz (115.6 kg)  04/20/21 256 lb 9.6 oz (116.4 kg)  01/18/21 259 lb (117.5 kg)    His blood pressure has been controlled at home, today their BP is BP: 104/62 BP Readings from Last 3 Encounters:  07/26/21 104/62  04/20/21 104/78  01/18/21 126/88    He does not workout. He denies chest pain, shortness of breath, dizziness.   He is on cholesterol medication, lovastatin 20mg  and zetia 10mg  and denies myalgias. He is going to be starting on Crestor 5 mg QD and d/c zetia and lovastatin per previous visit with Caryl Pina.   His cholesterol is not at goal, less than 70. The cholesterol last visit was:   Lab Results  Component Value Date   CHOL 107 04/20/2021   HDL 28 (L) 04/20/2021   LDLCALC 61 04/20/2021   TRIG 98 04/20/2021   CHOLHDL 3.8 04/20/2021   He has not been working on diet and exercise for diabetes. 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  With hyperlipidemia not at LDL goal, is on statin denies foot ulcerations, hypoglycemia , paresthesia of the feet, polydipsia, polyuria and visual disturbances.  He is on ACE and on bASA  On metformin two pills with supper a day (had diarrhea with 4 tabs/day even spread) and glimepiride 4 he is on 1/2 mg BID Has not been checking sugars.  Tried invokana but could not tolerate it due to yeast. Last A1C in the office was:  Lab Results  Component Value Date   HGBA1C 7.6 (H) 04/20/2021   CKD II  Lab Results  Component Value Date   GFRNONAA 92 04/20/2021   Patient is on Vitamin D supplement, was on 50000 IU weekly but did run out after last OV.  Lab Results  Component Value Date   VD25OH 47 01/18/2021    Current Medications:   Current Outpatient Medications (Endocrine & Metabolic):    glimepiride (AMARYL) 4 MG tablet, TAKE 1/2 TO 1 TABLET 2 X /DAY WITH MEALS FOR DIABETES   metFORMIN (GLUCOPHAGE-XR) 500 MG 24 hr tablet, Take 2 tablets    2 x day    with  Meals for Diabetes  Current Outpatient Medications (Cardiovascular):    losartan (COZAAR) 50 MG tablet, TAKE 1 TABLET DAILY FOR BLOOD PRESSURE   rosuvastatin (CRESTOR) 5 MG tablet, Take 1 tablet (5 mg total) by mouth daily.   tadalafil (CIALIS) 20 MG tablet, TAKE 1/2 TO 1 TABLET EVERY 2 TO 3 DAYS IF NEEDED  Current Outpatient Medications (Respiratory):    albuterol (PROAIR HFA) 108 (90 Base) MCG/ACT inhaler, Use  2 inhalations  15 minutes  apart every 4 hours  as needed to Rescue   BREO ELLIPTA 100-25 MCG/INH AEPB, INHALE 1 PUFF BY MOUTH EVERY DAY   fluticasone (FLONASE) 50 MCG/ACT nasal spray, USE 1 SPRAY(S) INTRANASALLY ONCE A DAY AS NEEDED    Current Outpatient Medications (Other):    glucose blood test strip, 1 each by Other route as  needed for other. 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   Review of Systems  Constitutional:  Negative for malaise/fatigue and weight loss.  HENT:  Negative for hearing loss and tinnitus.   Eyes:  Negative for blurred vision and double vision.  Respiratory:  Positive for shortness of breath (with heat/humidity changes). Negative for cough, sputum production and wheezing.   Cardiovascular:  Negative for chest pain, palpitations, orthopnea, claudication and leg swelling.  Gastrointestinal:  Negative for abdominal pain, blood in stool, constipation,  diarrhea, heartburn, melena, nausea and vomiting.  Genitourinary: Negative.  Negative for dysuria.  Musculoskeletal:  Positive for joint pain (R MCP joint intermittently, stiffness). Negative for myalgias.  Skin:  Negative for rash.  Neurological:  Negative for dizziness, tingling, sensory change, weakness and headaches.  Endo/Heme/Allergies:  Negative for polydipsia.  Psychiatric/Behavioral: Negative.  Negative for depression.   All other systems reviewed and are negative.   Physical Exam: Estimated body mass index is 37.63 kg/m as calculated from the following:   Height as of 01/18/21: 5\' 9"  (1.753 m).   Weight as of this encounter: 254 lb 12.8 oz (115.6 kg). Vitals:   07/26/21 1137  BP: 104/62  Pulse: 76  Temp: 97.9 F (36.6 C)  SpO2: 94%   General Appearance: Obese male , in no apparent distress. Eyes: PERRLA, EOMs, conjunctiva no swelling or erythema, normal fundi and vessels. Sinuses: No Frontal/maxillary tenderness ENT/Mouth: Ext aud canals clear, normal light reflex with TMs without erythema, bulging. Good dentition. No erythema, swelling, or exudate on post pharynx. Tonsils not swollen or erythematous. Hearing normal.  Neck: Supple, thyroid normal. No bruits Respiratory: Respiratory effort normal, BS equal bilaterally with mild expiratory wheeze clears with cough, without rales, rhonchi, or stridor. Cardio: RRR without murmurs, rubs or gallops. Brisk peripheral pulses without edema.  Abdomen: Soft, obese +BS. Non tender, no guarding, rebound, hernias, masses, or organomegaly. .  Lymphatics: Non tender without lymphadenopathy.  Musculoskeletal: Symmetrical ROM excepting R hand MCP joint with stiffness, no effusion or tenderness, reduced flexion, unable to make closed fist. No palpable nodules.  Skin: Warm, dry without rashes, lesions, ecchymosis.  Neuro: Cranial nerves intact, reflexes equal bilaterally. Normal muscle tone, no cerebellar symptoms. Sensation intact.  Psych:  Awake and oriented X 3, normal affect, Insight and Judgment appropriate.   Magda Bernheim, NP 12:04 PM [facility]

## 2021-07-26 ENCOUNTER — Encounter: Payer: Self-pay | Admitting: Nurse Practitioner

## 2021-07-26 ENCOUNTER — Other Ambulatory Visit: Payer: Self-pay

## 2021-07-26 ENCOUNTER — Ambulatory Visit: Payer: 59 | Admitting: Nurse Practitioner

## 2021-07-26 VITALS — BP 104/62 | HR 76 | Temp 97.9°F | Wt 254.8 lb

## 2021-07-26 DIAGNOSIS — G4733 Obstructive sleep apnea (adult) (pediatric): Secondary | ICD-10-CM

## 2021-07-26 DIAGNOSIS — F17200 Nicotine dependence, unspecified, uncomplicated: Secondary | ICD-10-CM

## 2021-07-26 DIAGNOSIS — J449 Chronic obstructive pulmonary disease, unspecified: Secondary | ICD-10-CM | POA: Diagnosis not present

## 2021-07-26 DIAGNOSIS — E1122 Type 2 diabetes mellitus with diabetic chronic kidney disease: Secondary | ICD-10-CM

## 2021-07-26 DIAGNOSIS — F17210 Nicotine dependence, cigarettes, uncomplicated: Secondary | ICD-10-CM

## 2021-07-26 DIAGNOSIS — E559 Vitamin D deficiency, unspecified: Secondary | ICD-10-CM

## 2021-07-26 DIAGNOSIS — E1169 Type 2 diabetes mellitus with other specified complication: Secondary | ICD-10-CM | POA: Diagnosis not present

## 2021-07-26 DIAGNOSIS — E785 Hyperlipidemia, unspecified: Secondary | ICD-10-CM

## 2021-07-26 DIAGNOSIS — I10 Essential (primary) hypertension: Secondary | ICD-10-CM

## 2021-07-26 DIAGNOSIS — Z79899 Other long term (current) drug therapy: Secondary | ICD-10-CM

## 2021-07-26 DIAGNOSIS — N182 Chronic kidney disease, stage 2 (mild): Secondary | ICD-10-CM

## 2021-07-26 DIAGNOSIS — N521 Erectile dysfunction due to diseases classified elsewhere: Secondary | ICD-10-CM

## 2021-07-26 DIAGNOSIS — Z9989 Dependence on other enabling machines and devices: Secondary | ICD-10-CM

## 2021-07-26 MED ORDER — ROSUVASTATIN CALCIUM 5 MG PO TABS: 5.0000 mg | ORAL_TABLET | Freq: Every day | ORAL | 5 refills | Status: DC

## 2021-07-26 NOTE — Patient Instructions (Signed)
Stop ezetimibe and lovastatin, start rosuvastatin(crestor)  Rosuvastatin Tablets What is this medication? ROSUVASTATIN (roe SOO va sta tin) treats high cholesterol and reduces the risk of heart attack and stroke. It works by decreasing bad cholesterol and fats (such as LDL, triglycerides), and increasing good cholesterol (HDL) in your blood. It belongs to a group of medications called statins. Changes to diet and exercise are often combined with this medication. This medicine may be used for other purposes; ask your health care provider or pharmacist if you have questions. COMMON BRAND NAME(S): Crestor What should I tell my care team before I take this medication? They need to know if you have any of these conditions: Diabetes (high blood sugar) If you often drink alcohol Kidney disease Liver disease Muscle cramps, pain Stroke Thyroid disease An unusual or allergic reaction to rosuvastatin, other medications, foods, dyes, or preservatives Pregnant or trying to get pregnant Breast-feeding How should I use this medication? Take this medication by mouth with a glass of water. Follow the directions on the prescription label. You can take it with or without food. If it upsets your stomach, take it with food. Do not cut, crush or chew this medication. Swallow the tablets whole. Take your medication at regular intervals. Do not take it more often than directed. Take antacids that have a combination of aluminum and magnesium hydroxide in them at a different time of day than this medication. Take these products 2 hours AFTER this medication. Talk to your care team about the use of this medication in children. While this medication may be prescribed for children as young as 7 for selected conditions, precautions do apply. Overdosage: If you think you have taken too much of this medicine contact a poison control center or emergency room at once. NOTE: This medicine is only for you. Do not share this  medicine with others. What if I miss a dose? If you miss a dose, take it as soon as you can. If your next dose is to be taken in less than 12 hours, then do not take the missed dose. Take the next dose at your regular time. Do not take double or extra doses. What may interact with this medication? Do not take this medication with any of the following: Supplements like red yeast rice This medication may also interact with the following: Alcohol Antacids containing aluminum hydroxide and magnesium hydroxide Cyclosporine Other medications for high cholesterol Some medications for HIV infection Warfarin This list may not describe all possible interactions. Give your health care provider a list of all the medicines, herbs, non-prescription drugs, or dietary supplements you use. Also tell them if you smoke, drink alcohol, or use illegal drugs. Some items may interact with your medicine. What should I watch for while using this medication? Visit your health care provider for regular checks on your progress. Tell your health care provider if your symptoms do not start to get better or if they get worse. Your health care provider may tell you to stop taking this medication if you develop muscle problems. If your muscle problems do not go away after stopping this medication, contact your health care provider. Do not become pregnant while taking this medication. Women should inform their health care provider if they wish to become pregnant or think they might be pregnant. There is potential for serious harm to an unborn child. Talk to your health care provider for more information. Do not breast-feed an infant while taking this medication. This medication may increase  blood sugar. Ask your health care provider if changes in diet or medications are needed if you have diabetes. If you are going to need surgery or other procedure, tell your health care provider that you are using this medication. Taking this  medication is only part of a total heart healthy program. Your health care provider may give you a special diet to follow. Avoid alcohol. Avoid smoking. Ask your health care provider how much you should exercise. What side effects may I notice from receiving this medication? Side effects that you should report to your care team as soon as possible: Allergic reactions-skin rash, itching, hives, swelling of the face, lips, tongue, or throat High blood sugar (hyperglycemia)-increased thirst or amount of urine, unusual weakness, fatigue, blurry vision Liver injury-right upper belly pain, loss of appetite, nausea, light-colored stool, dark yellow or brown urine, yellowing skin or eyes, unusual weakness, fatigue Muscle injury-unusual weakness, fatigue, muscle pain, dark yellow or brown urine, decrease in amount of urine Redness, blistering, peeling or loosening of the skin, including inside the mouth Side effects that usually do not require medical attention (report to your care team if they continue or are bothersome): Fatigue Headache Nausea Stomach pain This list may not describe all possible side effects. Call your doctor for medical advice about side effects. You may report side effects to FDA at 1-800-FDA-1088. Where should I keep my medication? Keep out of the reach of children and pets. Store between 20 and 25 degrees C (68 and 77 degrees F). Get rid of any unused medication after the expiration date. To get rid of medications that are no longer needed or have expired: Take the medication to a medication take-back program. Check with your pharmacy or law enforcement to find a location. If you cannot return the medication, check the label or package insert to see if the medication should be thrown out in the garbage or flushed down the toilet. If you are not sure, ask your care team. If it is safe to put it in the trash, take the medication out of the container. Mix the medication with cat  litter, dirt, coffee grounds, or other unwanted substance. Seal the mixture in a bag or container. Put it in the trash. NOTE: This sheet is a summary. It may not cover all possible information. If you have questions about this medicine, talk to your doctor, pharmacist, or health care provider.  2022 [employer]/Gold Standard (2020-11-05 12:30:47)

## 2021-07-27 LAB — COMPLETE METABOLIC PANEL WITH GFR
AG Ratio: 1.4 (calc) (ref 1.0–2.5)
ALT: 11 U/L (ref 9–46)
AST: 13 U/L (ref 10–35)
Albumin: 4.2 g/dL (ref 3.6–5.1)
Alkaline phosphatase (APISO): 71 U/L (ref 35–144)
BUN: 12 mg/dL (ref 7–25)
CO2: 26 mmol/L (ref 20–32)
Calcium: 9.7 mg/dL (ref 8.6–10.3)
Chloride: 104 mmol/L (ref 98–110)
Creat: 0.97 mg/dL (ref 0.70–1.35)
Globulin: 3.1 g/dL (calc) (ref 1.9–3.7)
Glucose, Bld: 109 mg/dL — ABNORMAL HIGH (ref 65–99)
Potassium: 4.6 mmol/L (ref 3.5–5.3)
Sodium: 140 mmol/L (ref 135–146)
Total Bilirubin: 0.5 mg/dL (ref 0.2–1.2)
Total Protein: 7.3 g/dL (ref 6.1–8.1)
eGFR: 88 mL/min/{1.73_m2} (ref >=60)

## 2021-07-27 LAB — CBC WITH DIFFERENTIAL/PLATELET
Absolute Monocytes: 561 cells/uL (ref 200–950)
Basophils Absolute: 27 cells/uL (ref 0–200)
Basophils Relative: 0.3 %
Eosinophils Absolute: 80 cells/uL (ref 15–500)
Eosinophils Relative: 0.9 %
HCT: 45.1 % (ref 38.5–50.0)
Hemoglobin: 14.7 g/dL (ref 13.2–17.1)
Lymphs Abs: 2510 cells/uL (ref 850–3900)
MCH: 28.7 pg (ref 27.0–33.0)
MCHC: 32.6 g/dL (ref 32.0–36.0)
MCV: 87.9 fL (ref 80.0–100.0)
MPV: 10.3 fL (ref 7.5–12.5)
Monocytes Relative: 6.3 %
Neutro Abs: 5723 cells/uL (ref 1500–7800)
Neutrophils Relative %: 64.3 %
Platelets: 195 10*3/uL (ref 140–400)
RBC: 5.13 10*6/uL (ref 4.20–5.80)
RDW: 12.6 % (ref 11.0–15.0)
Total Lymphocyte: 28.2 %
WBC: 8.9 10*3/uL (ref 3.8–10.8)

## 2021-07-27 LAB — LIPID PANEL
Cholesterol: 125 mg/dL (ref <200)
HDL: 34 mg/dL — ABNORMAL LOW (ref >=40)
LDL Cholesterol (Calc): 68 mg/dL (calc)
Non-HDL Cholesterol (Calc): 91 mg/dL (calc) (ref <130)
Total CHOL/HDL Ratio: 3.7 (calc) (ref <5.0)
Triglycerides: 152 mg/dL — ABNORMAL HIGH (ref <150)

## 2021-07-27 LAB — HEMOGLOBIN A1C
Hgb A1c MFr Bld: 7.7 % of total Hgb — ABNORMAL HIGH (ref <5.7)
Mean Plasma Glucose: 174 mg/dL
eAG (mmol/L): 9.7 mmol/L

## 2021-07-27 LAB — MICROALBUMIN / CREATININE URINE RATIO
Creatinine, Urine: 32 mg/dL (ref 20–320)
Microalb Creat Ratio: 6 mcg/mg creat (ref <30)
Microalb, Ur: 0.2 mg/dL

## 2021-07-27 LAB — TSH: TSH: 2.93 mIU/L (ref 0.40–4.50)

## 2021-08-18 ENCOUNTER — Other Ambulatory Visit: Payer: Self-pay | Admitting: Adult Health

## 2021-08-18 DIAGNOSIS — E119 Type 2 diabetes mellitus without complications: Secondary | ICD-10-CM

## 2021-10-25 NOTE — Progress Notes (Signed)
FOLLOW UP 3 MONTH  Assessment and Plan:  Essential hypertension - continue medications, DASH diet, exercise and monitor at home. Call if greater than 130/80.  -     CBC with Differential/Platelet -     CMP/GFR  Chronic obstructive pulmonary disease, unspecified COPD type ([place]) Advised to stop smoking, continue meds/Breo. May add mucinex if needed.  Patient has 40+ pack year smoking hx. Discussed progressive nature with continued smoking.    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 - discussed LDL goal <70 - Currently taking Rosuvastatin 5 mg and zetia 10 mg  -    Lipid panel   OSA with CPAP Has been wearing CPAP regularly since July after sleep study revealed OSA  Erectile dysfunction associated with type 2 diabetes mellitus (HCC) Control sugars, cialis PRN works well   Tobacco use disorder (40 + pack year current smoker) Smoking cessation-  instruction/counseling given, counseled patient on the dangers of tobacco use, advised patient to stop smoking, and reviewed strategies to maximize success, patient not ready to quit at this time.   Declines lung cancer screening -lung cancer screening with low dose CT discussed as recommended by guidelines based on age, number of pack year history.  Discussed risks of screening including but not limited to false positives on xray, further testing or consultation with  specialist, and possible false negative CT as well. Benefits of early detection of lung cancer discussed. He is not interested in pursuing any lung cancer screening at this time.   Medication management -     Microalbumin/creatinine urine ratio  Vitamin D deficiency Restart once weekly 50000 IU per patient preference, was well controlled with this dose per history review.   Morbid obesity ([place]) - follow up 4 months for progress monitoring - increase veggies, decrease carbs - long discussion about weight loss, diet, and exercise  Skin lesion of face Refer to skin surgery center for removal  Acute pain of left shoulder Possibly bursitis Pt to use Meloxicam daily for 10 days to determine if relief or improvement of pain.  If pain persists he is to call office and will get xray of left shoulder   Discussed med's effects and SE's. Screening labs and tests as requested with regular follow-up as recommended. Future Appointments  Date Time Provider [place]  01/18/2022  2:00 PM Liane Comber, NP [facility] None    HPI 66 y.o. male  presents for a three motnh follow up on HTN, HLD, T2DM, morbid obesity, smoking, vitamin D def.   He is a heavy smoker, 1-2 packs for many years with 40+ pack year hisotry. He has asthma/COPD, is on breo, did not do well with anoro (also reports did try trellegy ellipta, breztri, didn't like). Continues to use Albuterol PRN.  Has morning productive cough of white mucus from smoking and mouth dryness from CPAP.   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 Last lung imaging was CXR in 2009, hyperinflation. Continues to refuse lung CA screening  He is having pain in the right shoulder , worse with raising of arm above head.  Occurs on the posterior side and can be sharp shooting  or aching. Has been using Voltaren gel with little results.   He has noticed a place on his face which is  getting bigger and becoming white in color.  Would like to have removed.  BMI is Body mass index is 38.34 kg/m., he has not been working on diet and exercise other than cutting out sweets in the last 5 weeks. Due to his obesity/low testosterone he has ED and uses cialis PRN.   Wt Readings from Last 3 Encounters:  10/26/21 259 lb 9.6 oz (117.8 kg)  07/26/21 254 lb 12.8 oz (115.6 kg)  04/20/21 256 lb 9.6 oz (116.4 kg)    His blood pressure has been controlled at home, today their BP is BP: 100/72 BP Readings from Last 3 Encounters:  10/26/21 100/72  07/26/21 104/62  04/20/21 104/78    He does not workout. He denies chest pain, shortness of breath, dizziness.   He is on cholesterol medication, ls on Crestor 5 mg QD   His cholesterol is not at goal, less than 70. The cholesterol last visit was:   Lab Results  Component Value Date   CHOL 125 07/26/2021   HDL 34 (L) 07/26/2021   LDLCALC 68 07/26/2021   TRIG 152 (H) 07/26/2021   CHOLHDL 3.7 07/26/2021   He has not been working on diet and exercise for diabetes. 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  With hyperlipidemia not at LDL goal, is on statin denies foot ulcerations, hypoglycemia , paresthesia of the feet, polydipsia, polyuria and visual disturbances.  He is on ACE and on bASA  On metformin two pills with supper a day (had diarrhea with 4 tabs/day even spread) and glimepiride 4 he is on 1/2 mg BID Has not been checking sugars.  Tried invokana but could not tolerate it due to yeast. Last A1C in the office was:  Lab Results  Component Value Date   HGBA1C 7.7 (H) 07/26/2021   CKD II  Lab Results  Component Value Date   GFRNONAA 92 04/20/2021   Patient is on Vitamin D supplement, was on 50000 IU weekly but did run out after last OV.  Lab Results  Component Value Date   VD25OH 47 01/18/2021    Current Medications:   Current Outpatient Medications (Endocrine & Metabolic):    glimepiride (AMARYL) 4 MG  tablet, TAKE 1/2 TO 1 TABLET 2 X /DAY WITH MEALS FOR DIABETES   metFORMIN (GLUCOPHAGE-XR) 500 MG 24 hr tablet, Take 2 tablets    2 x day    with Meals for Diabetes  Current Outpatient Medications (Cardiovascular):    losartan (COZAAR) 50 MG tablet, TAKE 1 TABLET DAILY FOR BLOOD PRESSURE   rosuvastatin (CRESTOR) 5 MG tablet, Take 1 tablet (5 mg total) by mouth daily.   tadalafil (CIALIS) 20 MG tablet, TAKE 1/2 TO 1 TABLET EVERY 2 TO 3 DAYS IF NEEDED  Current Outpatient Medications (Respiratory):    albuterol (PROAIR HFA) 108 (90 Base) MCG/ACT inhaler, Use  2 inhalations  15 minutes  apart every 4 hours  as needed to Rescue   BREO ELLIPTA 100-25 MCG/INH AEPB, INHALE 1 PUFF BY MOUTH  EVERY DAY   fluticasone (FLONASE) 50 MCG/ACT nasal spray, USE 1 SPRAY(S) INTRANASALLY ONCE A DAY AS NEEDED    Current Outpatient Medications (Other):    Vitamin D, Ergocalciferol, (DRISDOL) 1.25 MG (50000 UNIT) CAPS capsule, 1 capsule once a week for Vitamin D Deficiency   glucose blood test strip, 1 each by Other route as needed for other. 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   Review of Systems  Constitutional:  Negative for malaise/fatigue and weight loss.  HENT:  Negative for hearing loss and tinnitus.   Eyes:  Negative for blurred vision and double vision.  Respiratory:  Positive for shortness of breath (with heat/humidity changes). Negative for cough, sputum production and wheezing.   Cardiovascular:  Negative for  chest pain, palpitations, orthopnea, claudication and leg swelling.  Gastrointestinal:  Negative for abdominal pain, blood in stool, constipation, diarrhea, heartburn, melena, nausea and vomiting.  Genitourinary: Negative.  Negative for dysuria.  Musculoskeletal:  Positive for joint pain (Left Shoulder). Negative for myalgias.  Skin:  Negative for rash.       Lesion on face increasing in size  Neurological:  Negative for dizziness, tingling, sensory change, weakness and headaches.  Endo/Heme/Allergies:  Negative for polydipsia.  Psychiatric/Behavioral: Negative.  Negative for depression.   All other systems reviewed and are negative.   Physical Exam: Estimated body mass index is 38.34 kg/m as calculated from the following:   Height as of 01/18/21: 5\' 9"  (1.753 m).   Weight as of this encounter: 259 lb 9.6 oz (117.8 kg). Vitals:   10/26/21 1432  BP: 100/72  Pulse: 84  Temp: (!) 97.5 F (36.4 C)  SpO2: 97%    General Appearance: Obese male , in no apparent distress. Eyes: PERRLA, EOMs, conjunctiva no swelling or erythema, normal fundi and vessels. Sinuses: No Frontal/maxillary tenderness ENT/Mouth: Ext aud canals clear, normal light reflex with TMs without erythema, bulging. Good dentition. No erythema, swelling, or exudate on post pharynx. Tonsils not swollen or erythematous. Hearing normal.  Neck: Supple, thyroid normal. No bruits Respiratory: Respiratory effort normal, BS equal bilaterally with mild expiratory wheeze clears with cough, without rales, rhonchi, or stridor. Cardio: RRR without murmurs, rubs or gallops. Brisk peripheral pulses without edema.  Abdomen: Soft, obese +BS. Non tender, no guarding, rebound, hernias, masses, or organomegaly. .  Lymphatics: Non tender without lymphadenopathy.  Musculoskeletal: Symmetrical ROM excepting Left shoulder unable to raise above head, tenderness, possible bursitis on left posterior shoulder Skin: Warm, dry . 1 cm white scaly lesion  on right cheek Neuro: Cranial nerves intact, reflexes equal bilaterally. Normal muscle tone, no cerebellar symptoms. Sensation intact.  Psych: Awake and oriented X 3, normal affect, Insight and Judgment appropriate.   Magda Bernheim, NP 2:40 PM [facility]

## 2021-10-26 ENCOUNTER — Ambulatory Visit: Payer: 59 | Admitting: Nurse Practitioner

## 2021-10-26 ENCOUNTER — Other Ambulatory Visit: Payer: Self-pay

## 2021-10-26 ENCOUNTER — Encounter: Payer: Self-pay | Admitting: Nurse Practitioner

## 2021-10-26 VITALS — BP 100/72 | HR 84 | Temp 97.5°F | Wt 259.6 lb

## 2021-10-26 DIAGNOSIS — J449 Chronic obstructive pulmonary disease, unspecified: Secondary | ICD-10-CM

## 2021-10-26 DIAGNOSIS — E785 Hyperlipidemia, unspecified: Secondary | ICD-10-CM

## 2021-10-26 DIAGNOSIS — E1169 Type 2 diabetes mellitus with other specified complication: Secondary | ICD-10-CM | POA: Diagnosis not present

## 2021-10-26 DIAGNOSIS — G4733 Obstructive sleep apnea (adult) (pediatric): Secondary | ICD-10-CM

## 2021-10-26 DIAGNOSIS — M25512 Pain in left shoulder: Secondary | ICD-10-CM

## 2021-10-26 DIAGNOSIS — L989 Disorder of the skin and subcutaneous tissue, unspecified: Secondary | ICD-10-CM

## 2021-10-26 DIAGNOSIS — E1122 Type 2 diabetes mellitus with diabetic chronic kidney disease: Secondary | ICD-10-CM

## 2021-10-26 DIAGNOSIS — I10 Essential (primary) hypertension: Secondary | ICD-10-CM

## 2021-10-26 DIAGNOSIS — E559 Vitamin D deficiency, unspecified: Secondary | ICD-10-CM

## 2021-10-26 DIAGNOSIS — N521 Erectile dysfunction due to diseases classified elsewhere: Secondary | ICD-10-CM

## 2021-10-26 DIAGNOSIS — Z79899 Other long term (current) drug therapy: Secondary | ICD-10-CM

## 2021-10-26 DIAGNOSIS — F17200 Nicotine dependence, unspecified, uncomplicated: Secondary | ICD-10-CM

## 2021-10-26 DIAGNOSIS — N182 Chronic kidney disease, stage 2 (mild): Secondary | ICD-10-CM

## 2021-10-26 MED ORDER — MELOXICAM 7.5 MG PO TABS: 7.5000 mg | ORAL_TABLET | Freq: Every day | ORAL | 2 refills | Status: DC

## 2021-10-27 LAB — COMPLETE METABOLIC PANEL WITH GFR
AG Ratio: 1.4 (calc) (ref 1.0–2.5)
ALT: 13 U/L (ref 9–46)
AST: 11 U/L (ref 10–35)
Albumin: 4.2 g/dL (ref 3.6–5.1)
Alkaline phosphatase (APISO): 66 U/L (ref 35–144)
BUN: 13 mg/dL (ref 7–25)
CO2: 25 mmol/L (ref 20–32)
Calcium: 9.3 mg/dL (ref 8.6–10.3)
Chloride: 101 mmol/L (ref 98–110)
Creat: 0.95 mg/dL (ref 0.70–1.35)
Globulin: 2.9 g/dL (calc) (ref 1.9–3.7)
Glucose, Bld: 235 mg/dL — ABNORMAL HIGH (ref 65–139)
Potassium: 4.3 mmol/L (ref 3.5–5.3)
Sodium: 136 mmol/L (ref 135–146)
Total Bilirubin: 0.4 mg/dL (ref 0.2–1.2)
Total Protein: 7.1 g/dL (ref 6.1–8.1)
eGFR: 90 mL/min/{1.73_m2} (ref >=60)

## 2021-10-27 LAB — CBC WITH DIFFERENTIAL/PLATELET
Absolute Monocytes: 637 cells/uL (ref 200–950)
Basophils Absolute: 9 cells/uL (ref 0–200)
Basophils Relative: 0.1 %
Eosinophils Absolute: 73 cells/uL (ref 15–500)
Eosinophils Relative: 0.8 %
HCT: 42.9 % (ref 38.5–50.0)
Hemoglobin: 14.4 g/dL (ref 13.2–17.1)
Lymphs Abs: 2621 cells/uL (ref 850–3900)
MCH: 29.6 pg (ref 27.0–33.0)
MCHC: 33.6 g/dL (ref 32.0–36.0)
MCV: 88.3 fL (ref 80.0–100.0)
MPV: 10.2 fL (ref 7.5–12.5)
Monocytes Relative: 7 %
Neutro Abs: 5760 cells/uL (ref 1500–7800)
Neutrophils Relative %: 63.3 %
Platelets: 193 10*3/uL (ref 140–400)
RBC: 4.86 10*6/uL (ref 4.20–5.80)
RDW: 12.7 % (ref 11.0–15.0)
Total Lymphocyte: 28.8 %
WBC: 9.1 10*3/uL (ref 3.8–10.8)

## 2021-10-27 LAB — LIPID PANEL
Cholesterol: 140 mg/dL (ref <200)
HDL: 36 mg/dL — ABNORMAL LOW (ref >=40)
LDL Cholesterol (Calc): 78 mg/dL (calc)
Non-HDL Cholesterol (Calc): 104 mg/dL (calc) (ref <130)
Total CHOL/HDL Ratio: 3.9 (calc) (ref <5.0)
Triglycerides: 164 mg/dL — ABNORMAL HIGH (ref <150)

## 2021-10-27 LAB — HEMOGLOBIN A1C
Hgb A1c MFr Bld: 8.8 % of total Hgb — ABNORMAL HIGH (ref <5.7)
Mean Plasma Glucose: 206 mg/dL
eAG (mmol/L): 11.4 mmol/L

## 2021-11-11 ENCOUNTER — Other Ambulatory Visit: Payer: Self-pay | Admitting: Adult Health

## 2021-11-23 LAB — HM DIABETES EYE EXAM

## 2021-12-07 ENCOUNTER — Encounter: Payer: Self-pay | Admitting: Internal Medicine

## 2021-12-12 ENCOUNTER — Other Ambulatory Visit: Payer: Self-pay

## 2021-12-12 MED ORDER — ALBUTEROL SULFATE HFA 108 (90 BASE) MCG/ACT IN AERS: INHALATION_SPRAY | RESPIRATORY_TRACT | 3 refills | Status: DC

## 2022-01-13 ENCOUNTER — Other Ambulatory Visit: Payer: Self-pay | Admitting: Nurse Practitioner

## 2022-01-13 DIAGNOSIS — M25512 Pain in left shoulder: Secondary | ICD-10-CM

## 2022-01-18 ENCOUNTER — Encounter: Payer: 59 | Admitting: Adult Health

## 2022-01-19 ENCOUNTER — Other Ambulatory Visit: Payer: Self-pay | Admitting: Internal Medicine

## 2022-01-19 ENCOUNTER — Other Ambulatory Visit: Payer: Self-pay | Admitting: Nurse Practitioner

## 2022-01-19 DIAGNOSIS — I10 Essential (primary) hypertension: Secondary | ICD-10-CM

## 2022-01-19 DIAGNOSIS — E1169 Type 2 diabetes mellitus with other specified complication: Secondary | ICD-10-CM

## 2022-01-30 NOTE — Progress Notes (Signed)
COMPLETE PHYSICAL ? ? ?Assessment and Plan: ? ?Luis Bonilla was seen today for annual exam and medication refill. ? ?Diagnoses and all orders for this visit: ? ?Encounter for general adult medical examination with abnormal findings ?Yearly ? ?Essential hypertension ?-  continue medications, DASH diet, exercise and monitor at home. Call if greater than 130/80.   ?-     CBC with Differential/Platelet ?-     COMPLETE METABOLIC PANEL WITH GFR ?-     TSH ?-     Magnesium ? ?Chronic obstructive pulmonary disease, unspecified COPD type ([place]) ?Advised to stop smoking, continue meds, currently on BREO ?Patient has 100 pack year smoking history ?Agreed to do low dose CT, no symptoms ? ?Type 2 diabetes mellitus with hyperlipidemia (Luis Bon[place]) ?Taking Amyryl half tablet BID, Metformin '500mg'$  two tablets BID ?Discussed general issues about diabetes pathophysiology and management., Educational material distributed., Suggested low cholesterol diet., Encouraged aerobic exercise., Discussed foot care., Reminded to get yearly retinal exam. ?Not at goal ?Admits to eating poorly ?Long discussion that with his smoking and DM he is VERY high risk MI/stroke ?-     Hemoglobin A1c  ? ?CKD stage 2 due to type 2 diabetes mellitus (Luis Bonilla) ?Increase fluids  ?Avoid NSAIDS ?Blood pressure control ?Monitor sugars  ?Will continue to monitor ? ?Mixed hyperlipidemia ?Continue Crestor, diet and exercise ?-     Lipid panel ?-     ezetimibe (ZETIA) 10 MG tablet; Take 1 tablet (10 mg total) by mouth daily. ? ?Morbid obesity (Luis Bonilla) ?Discussed dietary and exercise modifications ? ?Erectile dysfunction associated with type 2 diabetes mellitus (Luis Bonilla) ?Hypogonadism in male ?Has PRN tadalafil ? ?Uncomplicated asthma, unspecified asthma severity, unspecified whether persistent ?Continue breo, weight loss advised ?Patient needs proair, not ventolin ? ?Tobacco use disorder ?Discussed smoking cessation ?Not ready to quit at this time ?Continue to assess readiness ? ?Medication  management ?Continued ? ?PAC ?Strongly encouraged to limit caffeine and cut down/quit tobacco ? ?Vitamin D deficiency ?Continue supplementation to maintain goal of 70-100 ?Taking Vitamin D 50,000 IU daily ?-     VITAMIN D 25 Hydroxy (Vit-D Deficiency, Fractures) ? ?Screening PSA (prostate specific antigen) ?-     PSA ? ?Screening, ischemic heart disease ?-     EKG 12-Lead ? ?Screening for thyroid disorder ?-     TSH ? ?Screening for blood or protein in urine ?-     Routine UA with reflex microscopic ?-  Microalbumin/creatinine urine ratio ? ?Screening for colon cancer ?- Cologuard ? ?Screening for lung cancer ?- Low Dose CT scan ? ?Left Shoulder Pain ?- Xray of left shoulder ordered ?- If Xray negative will need referral to ortho because most likely rotator cuff. ? ? ?Further disposition pending results if labs check today. Discussed med's effects and SE's.   ?Over 30 minutes of face to face interview, exam, counseling, chart review, and critical decision making was performed.  ? ?Discussed med's effects and SE's. Screening labs and tests as requested with regular follow-up as recommended. ?Future Appointments  ?Date Time Provider Luis Bonilla  ?02/01/2023  2:00 PM [name], [place] Roger, NP [facility] None  ? ? ?HPI ?66 y.o. male  presents for a complete physical. ? ?His blood pressure has been controlled at home, today their BP is BP: 102/72  ?BP Readings from Last 3 Encounters:  ?01/31/22 102/72  ?10/26/21 100/72  ?07/26/21 104/62  ?  ?He does not workout. He denies chest pain, shortness of breath, dizziness.  ? ?He has been experiencing pain in  left shoulder x 4-5 months.  Unable to move left arm over head, laterally or to the back.  Will get sharp pain with certain movements or with pressure to the shoulder. Meloxicam is not helping the pain ? ?BMI is Body mass index is 39.2 kg/m?., he is working on diet and exercise. Due to his obesity/low testosterone he has ED and uses cialis PRN.  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  ?Wt Readings from Last 3 Encounters:  ?01/31/22 261 lb 9.6 oz (118.7 kg)  ?10/26/21 259 lb 9.6 oz (117.8 kg)  ?07/26/21 254 lb 12.8 oz (115.6 kg)  ? ?He has asthma/COPD, is on breo, did not do well with anoro  STATES HE HAD YEAST WITH THIS, and will occ use albuterol, does not want a new med at this time. Has had ventolin in the past, it is not as affective as proair. 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 2 packs a day for 50 years. Was previously scheduled for low dose CT but canceled ? ?He is on cholesterol medication, Rosuvastatin 5 mg, was started on zetia last visit but stopped. His cholesterol is not at goal, less than 70. The cholesterol last visit was:   ?Lab Results  ?Component Value Date  ? CHOL 140 10/26/2021  ? HDL 36 (L) 10/26/2021  ? [place] 78 10/26/2021  ? TRIG 164 (H) 10/26/2021  ? CHOLHDL 3.9 10/26/2021  ? ?He has not been working on diet and exercise for diabetes ?With hyperlipidemia not at goal ?denies foot ulcerations, hypoglycemia , paresthesia of the feet, polydipsia, polyuria and visual disturbances.  ?He is on ACE and on bASA and metformin two pills with supper a day and glimepiride, he is on 1/'2mg'$  BID, has not been checking sugars. Tried invokana but could not tolerate it due to yeast. ?Last A1C in the office was:  ?Lab Results  ?Component Value Date  ? HGBA1C 8.8 (H) 10/26/2021  ? ?Lab Results  ?Component Value Date  ? GFRNONAA 92 04/20/2021  ? ?Patient is on Vitamin D supplement, on 5000 IU ?Lab Results  ?Component Value Date  ? VD25OH 47 01/18/2021  ? ?Lab Results  ?Component Value Date  ? PSA 0.22 01/18/2021  ? PSA 0.2 12/17/2019  ? PSA 0.3 11/20/2017  ? ? ?Current Medications:  ?Current Outpatient Medications on File Prior to Visit  ?Medication Sig Dispense Refill  ? albuterol (PROAIR HFA) 108 (90 Base) MCG/ACT inhaler Use  2  inhalations  15 minutes  apart every 4 hours  as needed to Rescue 48 g 3  ? BREO ELLIPTA 100-25 MCG/ACT AEPB INHALE 1 PUFF BY MOUTH EVERY DAY 180 each 1  ? fluticasone (FLONASE) 50 MCG/ACT nasal spray USE 1 SPRAY(S) INTRANASALLY ONCE A DAY AS NEEDED  0  ? glimepiride (AMARYL) 4 MG tablet TAKE 1/2 TO 1 TABLET 2 X /DAY WITH MEALS FOR DIABETES 180 tablet 1  ? losartan (COZAAR) 50 MG tablet TAKE 1 TABLET BY MOUTH EVERY DAY FOR BLOOD PRESSURE 90 tablet 3  ? meloxicam (MOBIC) 7.5 MG tablet TAKE 1 TABLET BY MOUTH EVERY DAY 30 tablet 2  ? metFORMIN (GLUCOPHAGE-XR) 500 MG 24 hr tablet Take 2 tablets    2 x day    with Meals for Diabetes 360 tablet 2  ? rosuvastatin (CRESTOR) 5 MG tablet  TAKE 1 TABLET (5 MG TOTAL) BY MOUTH DAILY. 90 tablet 1  ? tadalafil (CIALIS) 20 MG tablet TAKE 1/2 TO 1 TABLET EVERY 2 TO 3 DAYS IF NEEDED 6 tablet 14  ? Vitamin D, Ergocalciferol, (DRISDOL) 1.25 MG (50000 UNIT) CAPS capsule 1 capsule once a week for Vitamin D Deficiency 13 capsule 3  ? glucose blood test strip 1 each by Other route as needed for other. Test Blood Sugar Once daily 100 each PRN  ? ?No current facility-administered medications on file prior to visit.  ? ?Immunization History  ?Administered Date(s) Administered  ? Influenza Inj Mdck Quad With Preservative 09/02/2019  ? Influenza-Unspecified 08/15/2017  ? PFIZER(Purple Top)SARS-COV-2 Vaccination 01/28/2020  ? Tdap 01/04/2015  ? ?Health Maintenance:  ?Tetanus: 2016 ?Pneumovax: declines ?Prevnar 13: when 67 ?Flu vaccine: 2020 AT work ?Zostavax: declines ?DEXA:N/A ?Colonoscopy: declines-willing to do cologuard ?EGD: N/A ? ?Eye Doctor: 2023 ?Dentist:None, discussed ? ?Medical History:  ?Past Medical History:  ?Diagnosis Date  ? Asthma   ? Erectile dysfunction   ? Hyperlipidemia   ? Hypertension   ? Other testicular hypofunction   ? Type II or unspecified type diabetes mellitus without mention of complication, not stated as uncontrolled   ? ?Allergies ?Allergies  ?Allergen Reactions   ? Flax [Bio-Flax]   ? ? ?SURGICAL HISTORY ?He  has no past surgical history on file. ?FAMILY HISTORY ?His family history includes Cancer in his father; Diabetes in his mother; Hypertension in his mother; Stroke in h

## 2022-01-31 ENCOUNTER — Ambulatory Visit
Admission: RE | Admit: 2022-01-31 | Discharge: 2022-01-31 | Disposition: A | Discharge status: Home / Self Care | Payer: 59 | Source: Ambulatory Visit | Attending: Nurse Practitioner | Admitting: Nurse Practitioner

## 2022-01-31 ENCOUNTER — Ambulatory Visit: Payer: 59 | Admitting: Nurse Practitioner

## 2022-01-31 ENCOUNTER — Encounter: Payer: Self-pay | Admitting: Nurse Practitioner

## 2022-01-31 VITALS — BP 102/72 | Temp 97.9°F | Ht 68.5 in | Wt 261.6 lb

## 2022-01-31 DIAGNOSIS — F17200 Nicotine dependence, unspecified, uncomplicated: Secondary | ICD-10-CM

## 2022-01-31 DIAGNOSIS — Z0000 Encounter for general adult medical examination without abnormal findings: Secondary | ICD-10-CM | POA: Diagnosis not present

## 2022-01-31 DIAGNOSIS — G4733 Obstructive sleep apnea (adult) (pediatric): Secondary | ICD-10-CM

## 2022-01-31 DIAGNOSIS — G8929 Other chronic pain: Secondary | ICD-10-CM

## 2022-01-31 DIAGNOSIS — N182 Chronic kidney disease, stage 2 (mild): Secondary | ICD-10-CM

## 2022-01-31 DIAGNOSIS — E1122 Type 2 diabetes mellitus with diabetic chronic kidney disease: Secondary | ICD-10-CM

## 2022-01-31 DIAGNOSIS — E1169 Type 2 diabetes mellitus with other specified complication: Secondary | ICD-10-CM

## 2022-01-31 DIAGNOSIS — I10 Essential (primary) hypertension: Secondary | ICD-10-CM

## 2022-01-31 DIAGNOSIS — I491 Atrial premature depolarization: Secondary | ICD-10-CM

## 2022-01-31 DIAGNOSIS — J45909 Unspecified asthma, uncomplicated: Secondary | ICD-10-CM

## 2022-01-31 DIAGNOSIS — Z136 Encounter for screening for cardiovascular disorders: Secondary | ICD-10-CM | POA: Diagnosis not present

## 2022-01-31 DIAGNOSIS — Z1389 Encounter for screening for other disorder: Secondary | ICD-10-CM

## 2022-01-31 DIAGNOSIS — Z1211 Encounter for screening for malignant neoplasm of colon: Secondary | ICD-10-CM

## 2022-01-31 DIAGNOSIS — Z125 Encounter for screening for malignant neoplasm of prostate: Secondary | ICD-10-CM

## 2022-01-31 DIAGNOSIS — Z79899 Other long term (current) drug therapy: Secondary | ICD-10-CM

## 2022-01-31 DIAGNOSIS — Z0001 Encounter for general adult medical examination with abnormal findings: Secondary | ICD-10-CM

## 2022-01-31 DIAGNOSIS — J449 Chronic obstructive pulmonary disease, unspecified: Secondary | ICD-10-CM

## 2022-01-31 DIAGNOSIS — Z1329 Encounter for screening for other suspected endocrine disorder: Secondary | ICD-10-CM

## 2022-01-31 DIAGNOSIS — E559 Vitamin D deficiency, unspecified: Secondary | ICD-10-CM

## 2022-01-31 NOTE — Patient Instructions (Addendum)
Ordered xray of left shoulder ?Walk in to [place] ? ?GENERAL HEALTH GOALS ?  ?Know what a healthy weight is for you (roughly BMI <25) and aim to maintain this ?  ?Aim for 7+ servings of fruits and vegetables daily ?  ?70-80+ fluid ounces of water or unsweet tea for healthy kidneys ?  ?Limit to max 1 drink of alcohol per day; avoid smoking/tobacco ?  ?Limit animal fats in diet for cholesterol and heart health - choose grass fed whenever available ?  ?Avoid highly processed foods, and foods high in saturated/trans fats ?  ?Aim for low stress - take time to unwind and care for your mental health ?  ?Aim for 150 min of moderate intensity exercise weekly for heart health, and weights twice weekly for bone health ?  ?Aim for 7-9 hours of sleep daily  ?

## 2022-02-01 ENCOUNTER — Other Ambulatory Visit: Payer: Self-pay | Admitting: Nurse Practitioner

## 2022-02-01 ENCOUNTER — Telehealth: Payer: Self-pay

## 2022-02-01 ENCOUNTER — Encounter: Payer: Self-pay | Admitting: Nurse Practitioner

## 2022-02-01 DIAGNOSIS — E1122 Type 2 diabetes mellitus with diabetic chronic kidney disease: Secondary | ICD-10-CM

## 2022-02-01 LAB — URINALYSIS, ROUTINE W REFLEX MICROSCOPIC
Bacteria, UA: NONE SEEN /HPF
Bilirubin Urine: NEGATIVE
Hgb urine dipstick: NEGATIVE
Hyaline Cast: NONE SEEN /LPF
Ketones, ur: NEGATIVE
Leukocytes,Ua: NEGATIVE
Nitrite: NEGATIVE
Specific Gravity, Urine: 1.033 (ref 1.001–1.035)
pH: 5.5 (ref 5.0–8.0)

## 2022-02-01 LAB — CBC WITH DIFFERENTIAL/PLATELET
Absolute Monocytes: 742 cells/uL (ref 200–950)
Basophils Absolute: 35 cells/uL (ref 0–200)
Basophils Relative: 0.3 %
Eosinophils Absolute: 58 cells/uL (ref 15–500)
Eosinophils Relative: 0.5 %
HCT: 45 % (ref 38.5–50.0)
Hemoglobin: 14.9 g/dL (ref 13.2–17.1)
Lymphs Abs: 2923 cells/uL (ref 850–3900)
MCH: 29.3 pg (ref 27.0–33.0)
MCHC: 33.1 g/dL (ref 32.0–36.0)
MCV: 88.6 fL (ref 80.0–100.0)
MPV: 9.9 fL (ref 7.5–12.5)
Monocytes Relative: 6.4 %
Neutro Abs: 7842 cells/uL — ABNORMAL HIGH (ref 1500–7800)
Neutrophils Relative %: 67.6 %
Platelets: 179 10*3/uL (ref 140–400)
RBC: 5.08 10*6/uL (ref 4.20–5.80)
RDW: 12.5 % (ref 11.0–15.0)
Total Lymphocyte: 25.2 %
WBC: 11.6 10*3/uL — ABNORMAL HIGH (ref 3.8–10.8)

## 2022-02-01 LAB — LIPID PANEL
Cholesterol: 138 mg/dL (ref <200)
HDL: 33 mg/dL — ABNORMAL LOW (ref >=40)
LDL Cholesterol (Calc): 69 mg/dL (calc)
Non-HDL Cholesterol (Calc): 105 mg/dL (calc) (ref <130)
Total CHOL/HDL Ratio: 4.2 (calc) (ref <5.0)
Triglycerides: 270 mg/dL — ABNORMAL HIGH (ref <150)

## 2022-02-01 LAB — MICROALBUMIN / CREATININE URINE RATIO
Creatinine, Urine: 205 mg/dL (ref 20–320)
Microalb Creat Ratio: 50 mcg/mg creat — ABNORMAL HIGH (ref <30)
Microalb, Ur: 10.3 mg/dL

## 2022-02-01 LAB — COMPLETE METABOLIC PANEL WITH GFR
AG Ratio: 1.5 (calc) (ref 1.0–2.5)
ALT: 19 U/L (ref 9–46)
AST: 15 U/L (ref 10–35)
Albumin: 4.2 g/dL (ref 3.6–5.1)
Alkaline phosphatase (APISO): 70 U/L (ref 35–144)
BUN: 17 mg/dL (ref 7–25)
CO2: 26 mmol/L (ref 20–32)
Calcium: 9.8 mg/dL (ref 8.6–10.3)
Chloride: 99 mmol/L (ref 98–110)
Creat: 1.02 mg/dL (ref 0.70–1.35)
Globulin: 2.8 g/dL (calc) (ref 1.9–3.7)
Glucose, Bld: 249 mg/dL — ABNORMAL HIGH (ref 65–99)
Potassium: 4.5 mmol/L (ref 3.5–5.3)
Sodium: 135 mmol/L (ref 135–146)
Total Bilirubin: 0.5 mg/dL (ref 0.2–1.2)
Total Protein: 7 g/dL (ref 6.1–8.1)
eGFR: 83 mL/min/{1.73_m2} (ref >=60)

## 2022-02-01 LAB — MAGNESIUM: Magnesium: 1.6 mg/dL (ref 1.5–2.5)

## 2022-02-01 LAB — HEMOGLOBIN A1C
Hgb A1c MFr Bld: 9.6 % of total Hgb — ABNORMAL HIGH (ref <5.7)
Mean Plasma Glucose: 229 mg/dL
eAG (mmol/L): 12.7 mmol/L

## 2022-02-01 LAB — VITAMIN D 25 HYDROXY (VIT D DEFICIENCY, FRACTURES): Vit D, 25-Hydroxy: 37 ng/mL (ref 30–100)

## 2022-02-01 LAB — MICROSCOPIC MESSAGE

## 2022-02-01 LAB — PSA: PSA: 0.23 ng/mL (ref <=4.00)

## 2022-02-01 LAB — TSH: TSH: 3.04 mIU/L (ref 0.40–4.50)

## 2022-02-01 MED ORDER — MOUNJARO 5 MG/0.5ML ~~LOC~~ SOAJ: 5.0000 mg | SUBCUTANEOUS | 2 refills | Status: DC

## 2022-02-01 NOTE — Telephone Encounter (Signed)
Prior Auth for [place] approved through 02/02/23 ?

## 2022-02-10 ENCOUNTER — Other Ambulatory Visit: Payer: Self-pay | Admitting: Internal Medicine

## 2022-02-10 DIAGNOSIS — E1169 Type 2 diabetes mellitus with other specified complication: Secondary | ICD-10-CM

## 2022-02-26 LAB — COLOGUARD: COLOGUARD: POSITIVE — AB

## 2022-02-27 ENCOUNTER — Other Ambulatory Visit: Payer: Self-pay | Admitting: Nurse Practitioner

## 2022-02-27 DIAGNOSIS — R195 Other fecal abnormalities: Secondary | ICD-10-CM

## 2022-02-28 ENCOUNTER — Encounter: Payer: Self-pay | Admitting: Gastroenterology

## 2022-03-14 ENCOUNTER — Ambulatory Visit (AMBULATORY_SURGERY_CENTER): Payer: Self-pay

## 2022-03-14 VITALS — Ht 68.5 in | Wt 262.0 lb

## 2022-03-14 DIAGNOSIS — R195 Other fecal abnormalities: Secondary | ICD-10-CM

## 2022-03-14 MED ORDER — NA SULFATE-K SULFATE-MG SULF 17.5-3.13-1.6 GM/177ML PO SOLN: 1.0000 | Freq: Once | ORAL | 0 refills | Status: AC

## 2022-03-14 NOTE — Progress Notes (Signed)
No egg or soy allergy known to patient  No issues known to pt with past sedation with any surgeries or procedures Patient denies ever being told they had issues or difficulty with intubation  No FH of Malignant Hyperthermia Pt is not on diet pills Pt is not on home 02  Pt is not on blood thinners  Pt denies issues with constipation  No A fib or A flutter NO PA's for preps discussed with pt In PV today  Discussed with pt there will be an out-of-pocket cost for prep and that varies from $0 to 70 + dollars - pt verbalized understanding  Pt instructed to use Singlecare.com or GoodRx for a price reduction on prep  PV completed over the phone. Pt verified name, DOB, address and insurance during PV today.  Pt given instruction packet to read and not return.  Pt encouraged to call with questions or issues.  Pt has My chart, procedure instructions sent via My Chart  Insurance confirmed with pt at [facility] today

## 2022-04-07 ENCOUNTER — Encounter: Payer: Self-pay | Admitting: Gastroenterology

## 2022-04-11 ENCOUNTER — Encounter: Payer: Self-pay | Admitting: Gastroenterology

## 2022-04-11 ENCOUNTER — Ambulatory Visit (AMBULATORY_SURGERY_CENTER): Payer: 59 | Admitting: Gastroenterology

## 2022-04-11 VITALS — BP 132/54 | HR 69 | Temp 98.0°F | Resp 15 | Ht 68.5 in | Wt 262.0 lb

## 2022-04-11 DIAGNOSIS — D120 Benign neoplasm of cecum: Secondary | ICD-10-CM | POA: Diagnosis not present

## 2022-04-11 DIAGNOSIS — R195 Other fecal abnormalities: Secondary | ICD-10-CM | POA: Diagnosis present

## 2022-04-11 DIAGNOSIS — D125 Benign neoplasm of sigmoid colon: Secondary | ICD-10-CM | POA: Diagnosis not present

## 2022-04-11 DIAGNOSIS — D124 Benign neoplasm of descending colon: Secondary | ICD-10-CM | POA: Diagnosis not present

## 2022-04-11 DIAGNOSIS — D123 Benign neoplasm of transverse colon: Secondary | ICD-10-CM | POA: Diagnosis not present

## 2022-04-11 DIAGNOSIS — D122 Benign neoplasm of ascending colon: Secondary | ICD-10-CM

## 2022-04-11 DIAGNOSIS — Z1211 Encounter for screening for malignant neoplasm of colon: Secondary | ICD-10-CM

## 2022-04-11 MED ORDER — SODIUM CHLORIDE 0.9 % IV SOLN: 500.0000 mL | Freq: Once | INTRAVENOUS | Status: DC

## 2022-04-11 NOTE — Progress Notes (Signed)
GASTROENTEROLOGY PROCEDURE H&P NOTE   Primary Care Physician: Unk Pinto, MD    Reason for Procedure:  Colon Cancer screening, +Cologuard  Plan:    Colonoscopy  Patient is appropriate for endoscopic procedure(s) in the ambulatory ([facility]) setting.  The nature of the procedure, as well as the risks, benefits, and alternatives were carefully and thoroughly reviewed with the patient. Ample time for discussion and questions allowed. The patient understood, was satisfied, and agreed to proceed.     HPI: Luis Bonilla is a 66 y.o. male who presents for colonoscopy for routine Colon Cancer screening and recent +Cologuard in 01/2022.  No active GI symptoms.  No known family history of colon cancer or related malignancy.    Past Medical History:  Diagnosis Date   Arthritis    LEFT shoulder/RIGHT hand   Asthma    uses inhaler   COPD (chronic obstructive pulmonary disease) ([place])    stage 1   Erectile dysfunction    Hyperlipidemia    on meds   Hypertension    on meds   Other testicular hypofunction    Sleep apnea    uses CPAP nightly   Type II or unspecified type diabetes mellitus without mention of complication, not stated as uncontrolled    on meds    Past Surgical History:  Procedure Laterality Date   DENTAL SURGERY     VASECTOMY      Prior to Admission medications   Medication Sig Start Date End Date Taking? Authorizing Provider  albuterol (PROAIR HFA) 108 (90 Base) MCG/ACT inhaler Use  2 inhalations  15 minutes  apart every 4 hours  as needed to Rescue 12/12/21  Yes Unk Pinto, MD  BREO ELLIPTA 100-25 MCG/ACT AEPB INHALE 1 PUFF BY MOUTH EVERY DAY 11/11/21  Yes Liane Comber, NP  glimepiride (AMARYL) 4 MG tablet TAKE 1/2 TO 1 TABLET 2 X /DAY WITH MEALS FOR DIABETES 08/18/21  Yes Liane Comber, NP  losartan (COZAAR) 50 MG tablet TAKE 1 TABLET BY MOUTH EVERY DAY FOR BLOOD PRESSURE 01/19/22  Yes Liane Comber, NP  metFORMIN (GLUCOPHAGE-XR) 500 MG 24 hr tablet  TAKE 2 TABLETS 2 X DAY WITH MEALS FOR DIABETES 02/10/22  Yes Alycia Rossetti, NP  rosuvastatin (CRESTOR) 5 MG tablet TAKE 1 TABLET (5 MG TOTAL) BY MOUTH DAILY. 01/19/22 01/19/23 Yes Liane Comber, NP  Vitamin D, Ergocalciferol, (DRISDOL) 1.25 MG (50000 UNIT) CAPS capsule 1 capsule once a week for Vitamin D Deficiency 04/20/21  Yes Corbett, Caryl Pina, NP  fluticasone (FLONASE) 50 MCG/ACT nasal spray USE 1 SPRAY(S) INTRANASALLY ONCE A DAY AS NEEDED 10/24/15   [provider]  glucose blood test strip 1 each by Other route as needed for other. Test Blood Sugar Once daily 01/27/15   Unk Pinto, MD  tadalafil (CIALIS) 20 MG tablet TAKE 1/2 TO 1 TABLET EVERY 2 TO 3 DAYS IF NEEDED 07/18/21   Alycia Rossetti, NP  tirzepatide [facility]) 5 MG/0.5ML Pen Inject 5 mg into the skin once a week. Patient not taking: Reported on 03/14/2022 02/01/22   Alycia Rossetti, NP    Current Outpatient Medications  Medication Sig Dispense Refill   albuterol (PROAIR HFA) 108 (90 Base) MCG/ACT inhaler Use  2 inhalations  15 minutes  apart every 4 hours  as needed to Rescue 48 g 3   BREO ELLIPTA 100-25 MCG/ACT AEPB INHALE 1 PUFF BY MOUTH EVERY DAY 180 each 1   glimepiride (AMARYL) 4 MG tablet TAKE 1/2 TO 1 TABLET  2 X /DAY WITH MEALS FOR DIABETES 180 tablet 1   losartan (COZAAR) 50 MG tablet TAKE 1 TABLET BY MOUTH EVERY DAY FOR BLOOD PRESSURE 90 tablet 3   metFORMIN (GLUCOPHAGE-XR) 500 MG 24 hr tablet TAKE 2 TABLETS 2 X DAY WITH MEALS FOR DIABETES 360 tablet 2   rosuvastatin (CRESTOR) 5 MG tablet TAKE 1 TABLET (5 MG TOTAL) BY MOUTH DAILY. 90 tablet 1   Vitamin D, Ergocalciferol, (DRISDOL) 1.25 MG (50000 UNIT) CAPS capsule 1 capsule once a week for Vitamin D Deficiency 13 capsule 3   fluticasone (FLONASE) 50 MCG/ACT nasal spray USE 1 SPRAY(S) INTRANASALLY ONCE A DAY AS NEEDED  0   glucose blood test strip 1 each by Other route as needed for other. Test Blood Sugar Once daily 100 each PRN   tadalafil (CIALIS) 20 MG  tablet TAKE 1/2 TO 1 TABLET EVERY 2 TO 3 DAYS IF NEEDED 6 tablet 14   tirzepatide (MOUNJARO) 5 MG/0.5ML Pen Inject 5 mg into the skin once a week. (Patient not taking: Reported on 03/14/2022) 2 mL 2   Current Facility-Administered Medications  Medication Dose Route Frequency Provider Last Rate Last Admin   0.9 %  sodium chloride infusion  500 mL Intravenous Once Jeric Slagel V, DO        Allergies as of 04/11/2022 - Review Complete 04/11/2022  Allergen Reaction Noted   Flaxseed (linseed)  12/28/2013   Flax [flax seed oil]  12/28/2013    Family History  Problem Relation Age of Onset   Stroke Mother    Hypertension Mother    Diabetes Mother    Cancer Father        lung   Throat cancer Maternal Grandmother 58       smoker   Colon polyps Neg Hx    Colon cancer Neg Hx    Esophageal cancer Neg Hx    Stomach cancer Neg Hx    Rectal cancer Neg Hx     Social History   Socioeconomic History   Marital status: Married    Spouse name: Not on file   Number of children: Not on file   Years of education: Not on file   Highest education level: Not on file  Occupational History   Not on file  Tobacco Use   Smoking status: Every Day    Packs/day: 2.00    Years: 21.00    Total pack years: 42.00    Types: Cigarettes    Start date: 10/24/1999   Smokeless tobacco: Never  Vaping Use   Vaping Use: Never used  Substance and Sexual Activity   Alcohol use: No   Drug use: No   Sexual activity: Not on file  Other Topics Concern   Not on file  Social History Narrative   Not on file   Social Determinants of Health   Financial Resource Strain: Not on file  Food Insecurity: Not on file  Transportation Needs: Not on file  Physical Activity: Not on file  Stress: Not on file  Social Connections: Not on file  Intimate Partner Violence: Not on file    Physical Exam: Vital signs in last 24 hours: '@BP'$  138/69   Pulse 81   Temp 98 F (36.7 C)   Ht 5' 8.5" (1.74 m)   Wt 262 lb  (118.8 kg)   SpO2 99%   BMI 39.26 kg/m  GEN: NAD EYE: Sclerae anicteric ENT: MMM CV: Non-tachycardic Pulm: CTA b/l GI: Soft, NT/ND NEURO:  Alert &  Oriented x [place], DO [facility]   04/11/2022 10:46 AM

## 2022-04-11 NOTE — Patient Instructions (Addendum)
HYOU HAD AN ENDOSCOPIC PROCEDURE TODAY AT [facility]:   Refer to the procedure report that was given to you for any specific questions about what was found during the examination.  If the procedure report does not answer your questions, please call your gastroenterologist to clarify.  If you requested that your care partner not be given the details of your procedure findings, then the procedure report has been included in a sealed envelope for you to review at your convenience later.  YOU SHOULD EXPECT: Some feelings of bloating in the abdomen. Passage of more gas than usual.  Walking can help get rid of the air that was put into your GI tract during the procedure and reduce the bloating. If you had a lower endoscopy (such as a colonoscopy or flexible sigmoidoscopy) you may notice spotting of blood in your stool or on the toilet paper. If you underwent a bowel prep for your procedure, you may not have a normal bowel movement for a few days.  Please Note:  You might notice some irritation and congestion in your nose or some drainage.  This is from the oxygen used during your procedure.  There is no need for concern and it should clear up in a day or so.  SYMPTOMS TO REPORT IMMEDIATELY:  Following lower endoscopy (colonoscopy or flexible sigmoidoscopy):  Excessive amounts of blood in the stool  Significant tenderness or worsening of abdominal pains  Swelling of the abdomen that is new, acute  Fever of 100F or higher   For urgent or emergent issues, a gastroenterologist can be reached at any hour by calling [phone]. Do not use MyChart messaging for urgent concerns.    DIET:  We do recommend a small meal at first, but then you may proceed to your regular diet.  Drink plenty of fluids but you should avoid alcoholic beverages for 24 hours.  ACTIVITY:  You should plan to take it easy for the rest of today and you should NOT DRIVE or use heavy machinery until tomorrow  (because of the sedation medicines used during the test).    FOLLOW UP: Our staff will call the number listed on your records 24-72 hours following your procedure to check on you and address any questions or concerns that you may have regarding the information given to you following your procedure. If we do not reach you, we will leave a message.  We will attempt to reach you two times.  During this call, we will ask if you have developed any symptoms of COVID 19. If you develop any symptoms (ie: fever, flu-like symptoms, shortness of breath, cough etc.) before then, please call [phone].  If you test positive for Covid 19 in the 2 weeks post procedure, please call and report this information to [place].    If any biopsies were taken you will be contacted by phone or by letter within the next 1-3 weeks.  Please call us at [phone] if you have not heard about the biopsies in 3 weeks.    SIGNATURES/CONFIDENTIALITY: You and/or your care partner have signed paperwork which will be entered into your electronic medical record.  These signatures attest to the fact that that the information above on your After Visit Summary has been reviewed and is understood.  Full responsibility of the confidentiality of this discharge information lies with you and/or your care-partner. ANDOUT ON POLYPS GIVEN TO YOU TODAY  Repeat Colonoscopy in 6 months   AWAIT PATHOLOGY RESULTS  ON POLYPS REMOVED TODAY  CONTINUE PRESENT MEDICATIONS AND DIET

## 2022-04-11 NOTE — Progress Notes (Signed)
Pt's states no medical or surgical changes since previsit or office visit. 

## 2022-04-11 NOTE — Progress Notes (Signed)
  Recall placed for colonoscopy to be repeated in 6 months

## 2022-04-11 NOTE — Progress Notes (Signed)
Sedate, gd SR, tolerated procedure well, VSS, report to RN 

## 2022-04-11 NOTE — Progress Notes (Signed)
Called to room to assist during endoscopic procedure.  Patient ID and intended procedure confirmed with present staff. Received instructions for my participation in the procedure from the performing physician.  

## 2022-04-11 NOTE — Op Note (Signed)
[facility] Patient Name: Luis Bonilla Procedure Date: 04/11/2022 10:44 AM MRN: [id number] Endoscopist: Gerrit Heck , MD Age: 66 Referring MD:  Date of Birth: 08/17/1956 Gender: Male Account #: [id number] Procedure:                Colonoscopy Indications:              Screening for colorectal malignant neoplasm, This                            is the patient's first colonoscopy, Incidental -                            Positive Cologuard test Medicines:                Monitored Anesthesia Care Procedure:                Pre-Anesthesia Assessment:                           - Prior to the procedure, a History and Physical                            was performed, and patient medications and                            allergies were reviewed. The patient's tolerance of                            previous anesthesia was also reviewed. The risks                            and benefits of the procedure and the sedation                            options and risks were discussed with the patient.                            All questions were answered, and informed consent                            was obtained. Prior Anticoagulants: The patient has                            taken no previous anticoagulant or antiplatelet                            agents. ASA Grade Assessment: III - A patient with                            severe systemic disease. After reviewing the risks                            and benefits, the patient was deemed in  satisfactory condition to undergo the procedure.                           After obtaining informed consent, the colonoscope                            was passed under direct vision. Throughout the                            procedure, the patient's blood pressure, pulse, and                            oxygen saturations were monitored continuously. The                            Olympus CF-HQ190L ([id number])  Colonoscope was                            introduced through the anus and advanced to the the                            terminal ileum. The colonoscopy was performed                            without difficulty. The patient tolerated the                            procedure well. The quality of the bowel                            preparation was good. The terminal ileum, ileocecal                            valve, appendiceal orifice, and rectum were                            photographed. Scope In: 10:57:11 AM Scope Out: 11:40:50 AM Scope Withdrawal Time: 0 hours 37 minutes 40 seconds  Total Procedure Duration: 0 hours 43 minutes 39 seconds  Findings:                 The perianal and digital rectal examinations were                            normal.                           17 sessile polyps were found in the transverse                            colon, ascending colon, and cecum. The polyps were                            3 to 20 mm in size. These polyps were removed with  a cold snare. Resection and retrieval were                            complete. Estimated blood loss was minimal.                           A 8 mm polyp was found in the ileocecal valve. The                            polyp was sessile. The polyp was removed with a                            cold snare. Resection and retrieval were complete.                            Estimated blood loss was minimal.                           Four sessile polyps were found in the sigmoid colon                            and descending colon. The polyps were 4 to 12 mm in                            size. These polyps were removed with a cold snare.                            Resection and retrieval were complete. Estimated                            blood loss was minimal.                           The retroflexed view of the distal rectum and anal                            verge was normal and  showed no anal or rectal                            abnormalities.                           The terminal ileum appeared normal. Complications:            No immediate complications. Estimated Blood Loss:     Estimated blood loss was minimal. Impression:               - 17 3 to 20 mm polyps in the transverse colon, in                            the ascending colon and in the cecum, removed with  a cold snare. Resected and retrieved.                           - One 8 mm polyp at the ileocecal valve, removed                            with a cold snare. Resected and retrieved.                           - Four 4 to 12 mm polyps in the sigmoid colon and                            in the descending colon, removed with a cold snare.                            Resected and retrieved.                           - The distal rectum and anal verge are normal on                            retroflexion view.                           - The examined portion of the ileum was normal. Recommendation:           - Patient has a contact number available for                            emergencies. The signs and symptoms of potential                            delayed complications were discussed with the                            patient. Return to normal activities tomorrow.                            Written discharge instructions were provided to the                            patient.                           - Resume previous diet.                           - Continue present medications.                           - Await pathology results.                           - Repeat colonoscopy in 6 months for surveillance  due to multiple large, adenomatous polyps.                           - Return to GI clinic PRN.                           - If >10 of the polyps are proven adenomatous,                            recommmend referral to the [facility]. Gerrit Heck, MD 04/11/2022 11:49:20 AM

## 2022-04-12 ENCOUNTER — Telehealth: Payer: Self-pay | Admitting: *Deleted

## 2022-04-12 NOTE — Telephone Encounter (Signed)
  Follow up Call-     04/11/2022   10:15 AM  Call back number  Post procedure Call Back phone  # [phone]  Permission to leave phone message Yes     Patient questions:  Do you have a fever, pain , or abdominal swelling? No. Pain Score  0 *  Have you tolerated food without any problems? Yes.    Have you been able to return to your normal activities? Yes.    Do you have any questions about your discharge instructions: Diet   No. Medications  No. Follow up visit  No.  Do you have questions or concerns about your Care? No.  Actions: * If pain score is 4 or above: No action needed, pain <4.

## 2022-04-24 ENCOUNTER — Other Ambulatory Visit: Payer: Self-pay

## 2022-04-24 DIAGNOSIS — D120 Benign neoplasm of cecum: Secondary | ICD-10-CM

## 2022-04-24 DIAGNOSIS — D122 Benign neoplasm of ascending colon: Secondary | ICD-10-CM

## 2022-04-24 DIAGNOSIS — D125 Benign neoplasm of sigmoid colon: Secondary | ICD-10-CM

## 2022-04-24 DIAGNOSIS — D124 Benign neoplasm of descending colon: Secondary | ICD-10-CM

## 2022-04-24 DIAGNOSIS — D123 Benign neoplasm of transverse colon: Secondary | ICD-10-CM

## 2022-05-02 ENCOUNTER — Telehealth: Payer: Self-pay | Admitting: Genetic Counselor

## 2022-05-02 NOTE — Telephone Encounter (Signed)
Scheduled appt per 7/3 referral. Pt is aware of appt date and time. Pt is aware to arrive 15 mins prior to appt time and to bring and updated insurance card. Pt is aware of appt location.

## 2022-05-08 NOTE — Progress Notes (Deleted)
FOLLOW UP 3 MONTH  Assessment and Plan:  Essential hypertension - continue medications, DASH diet, exercise and monitor at home. Call if greater than 130/80.  -     CBC with Differential/Platelet -     CMP/GFR  Chronic obstructive pulmonary disease, unspecified COPD type ([place]) Advised to stop smoking, continue meds/Breo. May add mucinex if needed.  Patient has 40+ pack year smoking hx. Discussed progressive nature with continued smoking.    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 - discussed LDL goal <70 - Currently taking Rosuvastatin 5 mg and zetia 10 mg  -    Lipid panel   OSA with CPAP Has been wearing CPAP regularly since July after sleep study revealed OSA  Erectile dysfunction associated with type 2 diabetes mellitus (HCC) Control sugars, cialis PRN works well   Tobacco use disorder (40 + pack year current smoker) Smoking cessation-  instruction/counseling given, counseled patient on the dangers of tobacco use, advised patient to stop smoking, and reviewed strategies to maximize success, patient not ready to quit at this time.   Declines lung cancer screening -lung cancer screening with low dose CT discussed as recommended by guidelines based on age, number of pack year history.  Discussed risks of screening including but not limited to false positives on xray, further testing or consultation with  specialist, and possible false negative CT as well. Benefits of early detection of lung cancer discussed. He is not interested in pursuing any lung cancer screening at this time.   Medication management -     Microalbumin/creatinine urine ratio  Vitamin D deficiency Restart once weekly 50000 IU per patient preference, was well controlled with this dose per history review.   Morbid obesity ([place]) - follow up 4 months for progress monitoring - increase veggies, decrease carbs - long discussion about weight loss, diet, and exercise     Discussed med's effects and SE's. Screening labs and tests as requested with regular follow-up as recommended. Future Appointments  Date Time Provider [place]  05/09/2022  2:30 PM Alycia Rossetti, NP [facility] None  06/20/2022  1:00 PM Flippin, Mammie Lorenzo, Counselor [facility] None  06/20/2022  2:00 PM [facility] None  02/01/2023  2:00 PM Alycia Rossetti, NP [facility] None    HPI 66 y.o. male  presents for a three motnh follow up on HTN, HLD, T2DM, morbid obesity, smoking, vitamin D def.   He is a heavy smoker, 1-2 packs for many years with 40+ pack year hisotry. He has asthma/COPD, is on breo, did not do well with anoro (also reports did try trellegy ellipta, breztri, didn't like). Continues to use Albuterol PRN.  Has morning productive cough of white mucus from smoking and mouth dryness from CPAP.   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 Last lung  imaging was CXR in 2009, hyperinflation. Continues to refuse lung CA screening  He is having pain in the right shoulder , worse with raising of arm above head.  Occurs on the posterior side and can be sharp shooting  or aching. Has been using Voltaren gel with little results.   He has noticed a place on his face which is getting bigger and becoming white in color.  Would like to have removed.  BMI is There is  no height or weight on file to calculate BMI., he has not been working on diet and exercise other than cutting out sweets in the last 5 weeks. Due to his obesity/low testosterone he has ED and uses cialis PRN.   Wt Readings from Last 3 Encounters:  04/11/22 262 lb (118.8 kg)  03/14/22 262 lb (118.8 kg)  01/31/22 261 lb 9.6 oz (118.7 kg)    His blood pressure has been controlled at home, today their BP is   BP Readings from Last 3 Encounters:  04/11/22 (!) 132/54  01/31/22 102/72  10/26/21 100/72    He does not workout. He denies chest pain, shortness of breath, dizziness.   He is on cholesterol medication, ls on Crestor 5 mg QD   His cholesterol is not at goal, less than 70. The cholesterol last visit was:   Lab Results  Component Value Date   CHOL 138 01/31/2022   HDL 33 (L) 01/31/2022   LDLCALC 69 01/31/2022   TRIG 270 (H) 01/31/2022   CHOLHDL 4.2 01/31/2022   He has not been working on diet and exercise for diabetes. 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  With hyperlipidemia not at LDL goal, is on statin denies foot ulcerations, hypoglycemia , paresthesia of the feet, polydipsia, polyuria and visual disturbances.  He is on ACE and on bASA  On metformin two pills with supper a day (had diarrhea with 4 tabs/day even spread) and glimepiride 4 he is on 1/2 mg BID Has not been checking sugars.  Tried invokana but could not tolerate it due to yeast. Last A1C in the office was:  Lab Results  Component Value Date   HGBA1C 9.6 (H) 01/31/2022   CKD II  Lab Results  Component Value Date   GFRNONAA 92 04/20/2021   Patient is on Vitamin D supplement, was on 50000 IU weekly but did run out after last OV.  Lab Results  Component Value Date   VD25OH 37 01/31/2022    Current Medications:   Current Outpatient Medications (Endocrine & Metabolic):    glimepiride (AMARYL) 4 MG tablet, TAKE 1/2 TO 1 TABLET 2 X /DAY WITH MEALS FOR DIABETES   metFORMIN (GLUCOPHAGE-XR) 500  MG 24 hr tablet, TAKE 2 TABLETS 2 X DAY WITH MEALS FOR DIABETES   tirzepatide (MOUNJARO) 5 MG/0.5ML Pen, Inject 5 mg into the skin once a week. (Patient not taking: Reported on 03/14/2022)  Current Outpatient Medications (Cardiovascular):    losartan (COZAAR) 50 MG tablet, TAKE 1 TABLET BY MOUTH EVERY DAY FOR BLOOD PRESSURE   rosuvastatin (CRESTOR) 5 MG tablet, TAKE 1 TABLET (5 MG TOTAL) BY MOUTH DAILY.   tadalafil (CIALIS) 20 MG tablet, TAKE 1/2 TO 1 TABLET EVERY 2 TO 3 DAYS IF NEEDED  Current Outpatient Medications (Respiratory):    albuterol (PROAIR HFA) 108 (90 Base) MCG/ACT inhaler, Use  2 inhalations  15 minutes  apart every 4 hours  as needed to Rescue   BREO ELLIPTA 100-25 MCG/ACT AEPB, INHALE 1  PUFF BY MOUTH EVERY DAY   fluticasone (FLONASE) 50 MCG/ACT nasal spray, USE 1 SPRAY(S) INTRANASALLY ONCE A DAY AS NEEDED    Current Outpatient Medications (Other):    glucose blood test strip, 1 each by Other route as needed for other. Test Blood Sugar Once daily   Vitamin D, Ergocalciferol, (DRISDOL) 1.25 MG (50000 UNIT) CAPS capsule, 1 capsule once a week for Vitamin D Deficiency  Medical History:  Past Medical History:  Diagnosis Date   Arthritis    LEFT shoulder/RIGHT hand   Asthma    uses inhaler   COPD (chronic obstructive pulmonary disease) ([place])    stage 1   Erectile dysfunction    Hyperlipidemia    on meds   Hypertension    on meds   Other testicular hypofunction    Sleep apnea    uses CPAP nightly   Type II or unspecified type diabetes mellitus without mention of complication, not stated as uncontrolled    on meds   Allergies Allergies  Allergen Reactions   Flaxseed (Linseed)     Other reaction(s): Other Bad after taste.   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   Review of Systems  Constitutional:  Negative for  malaise/fatigue and weight loss.  HENT:  Negative for hearing loss and tinnitus.   Eyes:  Negative for blurred vision and double vision.  Respiratory:  Positive for shortness of breath (with heat/humidity changes). Negative for cough, sputum production and wheezing.   Cardiovascular:  Negative for chest pain, palpitations, orthopnea, claudication and leg swelling.  Gastrointestinal:  Negative for abdominal pain, blood in stool, constipation, diarrhea, heartburn, melena, nausea and vomiting.  Genitourinary: Negative.  Negative for dysuria.  Musculoskeletal:  Positive for joint pain (Left Shoulder). Negative for myalgias.  Skin:  Negative for rash.       Lesion on face increasing in size  Neurological:  Negative for dizziness, tingling, sensory change, weakness and headaches.  Endo/Heme/Allergies:  Negative for polydipsia.  Psychiatric/Behavioral: Negative.  Negative for depression.   All other systems reviewed and are negative.    Physical Exam: Estimated body mass index is 39.26 kg/m as calculated from the following:   Height as of 04/11/22: 5' 8.5" (1.74 m).   Weight as of 04/11/22: 262 lb (118.8 kg). There were no vitals filed for this visit.   General Appearance: Obese male , in no apparent distress. Eyes: PERRLA, EOMs, conjunctiva no swelling or erythema, normal fundi and vessels. Sinuses: No Frontal/maxillary tenderness ENT/Mouth: Ext aud canals clear, normal light reflex with TMs without erythema, bulging. Good dentition. No erythema, swelling, or exudate on post pharynx. Tonsils not swollen or erythematous. Hearing normal.  Neck: Supple, thyroid normal. No bruits Respiratory: Respiratory effort normal, BS equal bilaterally with mild expiratory wheeze clears with cough, without rales, rhonchi, or stridor. Cardio: RRR without murmurs, rubs or gallops. Brisk peripheral pulses without edema.  Abdomen: Soft, obese +BS. Non tender, no guarding, rebound, hernias, masses, or  organomegaly. .  Lymphatics: Non tender without lymphadenopathy.  Musculoskeletal: Symmetrical ROM excepting Left shoulder unable to raise above head, tenderness, possible bursitis on left posterior shoulder Skin: Warm, dry . 1 cm white scaly lesion on right cheek Neuro: Cranial nerves intact, reflexes equal bilaterally. Normal muscle tone, no cerebellar symptoms. Sensation intact.  Psych: Awake and oriented X 3, normal affect, Insight and Judgment appropriate.  Alycia Rossetti, NP 11:37 AM [facility]

## 2022-05-09 ENCOUNTER — Other Ambulatory Visit: Payer: Self-pay

## 2022-05-09 ENCOUNTER — Ambulatory Visit: Payer: 59 | Admitting: Nurse Practitioner

## 2022-05-09 DIAGNOSIS — I10 Essential (primary) hypertension: Secondary | ICD-10-CM

## 2022-05-09 DIAGNOSIS — E1122 Type 2 diabetes mellitus with diabetic chronic kidney disease: Secondary | ICD-10-CM

## 2022-05-09 DIAGNOSIS — E559 Vitamin D deficiency, unspecified: Secondary | ICD-10-CM

## 2022-05-09 DIAGNOSIS — J449 Chronic obstructive pulmonary disease, unspecified: Secondary | ICD-10-CM

## 2022-05-09 DIAGNOSIS — Z79899 Other long term (current) drug therapy: Secondary | ICD-10-CM

## 2022-05-09 DIAGNOSIS — E1169 Type 2 diabetes mellitus with other specified complication: Secondary | ICD-10-CM

## 2022-05-09 DIAGNOSIS — F17200 Nicotine dependence, unspecified, uncomplicated: Secondary | ICD-10-CM

## 2022-05-09 MED ORDER — MOUNJARO 5 MG/0.5ML ~~LOC~~ SOAJ: 5.0000 mg | SUBCUTANEOUS | 2 refills | Status: DC

## 2022-05-09 MED ORDER — FLUTICASONE FUROATE-VILANTEROL 100-25 MCG/ACT IN AEPB: INHALATION_SPRAY | RESPIRATORY_TRACT | 1 refills | Status: DC

## 2022-05-09 MED ORDER — ALBUTEROL SULFATE HFA 108 (90 BASE) MCG/ACT IN AERS: INHALATION_SPRAY | RESPIRATORY_TRACT | 3 refills | Status: DC

## 2022-05-16 NOTE — Progress Notes (Deleted)
FOLLOW UP 3 MONTH  Assessment and Plan:  Essential hypertension - continue medications, DASH diet, exercise and monitor at home. Call if greater than 130/80.  -     CBC with Differential/Platelet -     CMP/GFR  Chronic obstructive pulmonary disease, unspecified COPD type ([place]) Advised to stop smoking, continue meds/Breo. May add mucinex if needed.  Patient has 40+ pack year smoking hx. Discussed progressive nature with continued smoking.    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 - discussed LDL goal <70 - Currently taking Rosuvastatin 5 mg and zetia 10 mg  -    Lipid panel   OSA with CPAP Has been wearing CPAP regularly since July after sleep study revealed OSA  Erectile dysfunction associated with type 2 diabetes mellitus (HCC) Control sugars, cialis PRN works well   Tobacco use disorder (40 + pack year current smoker) Smoking cessation-  instruction/counseling given, counseled patient on the dangers of tobacco use, advised patient to stop smoking, and reviewed strategies to maximize success, patient not ready to quit at this time.   Declines lung cancer screening -lung cancer screening with low dose CT discussed as recommended by guidelines based on age, number of pack year history.  Discussed risks of screening including but not limited to false positives on xray, further testing or consultation with  specialist, and possible false negative CT as well. Benefits of early detection of lung cancer discussed. He is not interested in pursuing any lung cancer screening at this time.   Medication management -     Microalbumin/creatinine urine ratio  Vitamin D deficiency Restart once weekly 50000 IU per patient preference, was well controlled with this dose per history review.   Morbid obesity ([place]) - follow up 4 months for progress monitoring - increase veggies, decrease carbs - long discussion about weight loss, diet, and exercise     Discussed med's effects and SE's. Screening labs and tests as requested with regular follow-up as recommended. Future Appointments  Date Time Provider [place]  05/17/2022  3:00 PM Alycia Rossetti, NP [facility] None  06/06/2022  3:00 PM [facility] CT 1 [facility] [address] [facility]  06/20/2022  1:00 PM Flippin, Mammie Lorenzo, Counselor [facility] None  06/20/2022  2:00 PM [facility] None  02/01/2023  2:00 PM Alycia Rossetti, NP [facility] None    HPI 66 y.o. male  presents for a three motnh follow up on HTN, HLD, T2DM, morbid obesity, smoking, vitamin D def.   He is a heavy smoker, 1-2 packs for many years with 40+ pack year hisotry. He has asthma/COPD, is on breo, did not do well with anoro (also reports did try trellegy ellipta, breztri, didn't like). Continues to use Albuterol PRN.  Has morning productive cough of white mucus from smoking and mouth dryness from CPAP.   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 Last lung imaging was CXR in 2009, hyperinflation. Continues to refuse lung CA screening  He is having pain in the right shoulder , worse with raising of arm above head.  Occurs on the posterior side and can be sharp shooting  or aching. Has been using Voltaren gel with little results.   He has noticed a place on his face which is getting bigger and becoming white in  color.  Would like to have removed.  BMI is There is no height or weight on file to calculate BMI., he has not been working on diet and exercise other than cutting out sweets in the last 5 weeks. Due to his obesity/low testosterone he has ED and uses cialis PRN.   Wt Readings from Last 3 Encounters:  04/11/22 262 lb (118.8 kg)  03/14/22 262 lb (118.8 kg)  01/31/22 261 lb 9.6 oz (118.7 kg)    His blood pressure has been controlled at home, today their BP is   BP Readings from Last 3 Encounters:  04/11/22 (!) 132/54  01/31/22 102/72  10/26/21 100/72    He does not workout. He denies chest pain, shortness of breath, dizziness.   He is on cholesterol medication, ls on Crestor 5 mg QD   His cholesterol is not at goal, less than 70. The cholesterol last visit was:   Lab Results  Component Value Date   CHOL 138 01/31/2022   HDL 33 (L) 01/31/2022   LDLCALC 69 01/31/2022   TRIG 270 (H) 01/31/2022   CHOLHDL 4.2 01/31/2022   He has not been working on diet and exercise for diabetes. 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  With hyperlipidemia not at LDL goal, is on statin denies foot ulcerations, hypoglycemia , paresthesia of the feet, polydipsia, polyuria and visual disturbances.  He is on ACE and on bASA  On metformin two pills with supper a day (had diarrhea with 4 tabs/day even spread) and glimepiride 4 he is on 1/2 mg BID Has not been checking sugars.  Tried invokana but could not tolerate it due to yeast. Last A1C in the office was:  Lab Results  Component Value Date   HGBA1C 9.6 (H) 01/31/2022   CKD II  Lab Results  Component Value Date   GFRNONAA 92 04/20/2021   Patient is on Vitamin D supplement, was on 50000 IU weekly but did run out after last OV.  Lab Results  Component Value Date   VD25OH 37 01/31/2022    Current Medications:   Current Outpatient Medications (Endocrine & Metabolic):    glimepiride (AMARYL) 4 MG tablet, TAKE 1/2 TO 1 TABLET 2 X /DAY  WITH MEALS FOR DIABETES   metFORMIN (GLUCOPHAGE-XR) 500 MG 24 hr tablet, TAKE 2 TABLETS 2 X DAY WITH MEALS FOR DIABETES   tirzepatide (MOUNJARO) 5 MG/0.5ML Pen, Inject 5 mg into the skin once a week.  Current Outpatient Medications (Cardiovascular):    losartan (COZAAR) 50 MG tablet, TAKE 1 TABLET BY MOUTH EVERY DAY FOR BLOOD PRESSURE   rosuvastatin (CRESTOR) 5 MG tablet, TAKE 1 TABLET (5 MG TOTAL) BY MOUTH DAILY.   tadalafil (CIALIS) 20 MG tablet, TAKE 1/2 TO 1 TABLET EVERY 2 TO 3 DAYS IF NEEDED  Current Outpatient Medications (Respiratory):    albuterol (PROAIR HFA) 108 (90 Base) MCG/ACT inhaler, Use  2 inhalations  15 minutes  apart every 4 hours  as needed to Rescue   fluticasone (  FLONASE) 50 MCG/ACT nasal spray, USE 1 SPRAY(S) INTRANASALLY ONCE A DAY AS NEEDED   fluticasone furoate-vilanterol (BREO ELLIPTA) 100-25 MCG/ACT AEPB, INHALE 1 PUFF BY MOUTH EVERY DAY    Current Outpatient Medications (Other):    glucose blood test strip, 1 each by Other route as needed for other. Test Blood Sugar Once daily   Vitamin D, Ergocalciferol, (DRISDOL) 1.25 MG (50000 UNIT) CAPS capsule, 1 capsule once a week for Vitamin D Deficiency  Medical History:  Past Medical History:  Diagnosis Date   Arthritis    LEFT shoulder/RIGHT hand   Asthma    uses inhaler   COPD (chronic obstructive pulmonary disease) ([place])    stage 1   Erectile dysfunction    Hyperlipidemia    on meds   Hypertension    on meds   Other testicular hypofunction    Sleep apnea    uses CPAP nightly   Type II or unspecified type diabetes mellitus without mention of complication, not stated as uncontrolled    on meds   Allergies Allergies  Allergen Reactions   Flaxseed (Linseed)     Other reaction(s): Other Bad after taste.   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    Review of Systems  Constitutional:  Negative for malaise/fatigue and weight loss.  HENT:  Negative for hearing loss and tinnitus.   Eyes:  Negative for blurred vision and double vision.  Respiratory:  Positive for shortness of breath (with heat/humidity changes). Negative for cough, sputum production and wheezing.   Cardiovascular:  Negative for chest pain, palpitations, orthopnea, claudication and leg swelling.  Gastrointestinal:  Negative for abdominal pain, blood in stool, constipation, diarrhea, heartburn, melena, nausea and vomiting.  Genitourinary: Negative.  Negative for dysuria.  Musculoskeletal:  Positive for joint pain (Left Shoulder). Negative for myalgias.  Skin:  Negative for rash.       Lesion on face increasing in size  Neurological:  Negative for dizziness, tingling, sensory change, weakness and headaches.  Endo/Heme/Allergies:  Negative for polydipsia.  Psychiatric/Behavioral: Negative.  Negative for depression.   All other systems reviewed and are negative.    Physical Exam: Estimated body mass index is 39.26 kg/m as calculated from the following:   Height as of 04/11/22: 5' 8.5" (1.74 m).   Weight as of 04/11/22: 262 lb (118.8 kg). There were no vitals filed for this visit.   General Appearance: Obese male , in no apparent distress. Eyes: PERRLA, EOMs, conjunctiva no swelling or erythema, normal fundi and vessels. Sinuses: No Frontal/maxillary tenderness ENT/Mouth: Ext aud canals clear, normal light reflex with TMs without erythema, bulging. Good dentition. No erythema, swelling, or exudate on post pharynx. Tonsils not swollen or erythematous. Hearing normal.  Neck: Supple, thyroid normal. No bruits Respiratory: Respiratory effort normal, BS equal bilaterally with mild expiratory wheeze clears with cough, without rales, rhonchi, or stridor. Cardio: RRR without murmurs, rubs or gallops. Brisk peripheral pulses without edema.  Abdomen: Soft, obese +BS. Non tender,  no guarding, rebound, hernias, masses, or organomegaly. .  Lymphatics: Non tender without lymphadenopathy.  Musculoskeletal: Symmetrical ROM excepting Left shoulder unable to raise above head, tenderness, possible bursitis on left posterior shoulder Skin: Warm, dry . 1 cm white scaly lesion on right cheek Neuro: Cranial nerves intact, reflexes equal bilaterally. Normal muscle tone, no cerebellar symptoms. Sensation intact.  Psych: Awake and oriented  X 3, normal affect, Insight and Judgment appropriate.   Alycia Rossetti, NP 12:02 PM [facility]

## 2022-05-17 ENCOUNTER — Ambulatory Visit: Payer: 59 | Admitting: Nurse Practitioner

## 2022-05-17 DIAGNOSIS — E1169 Type 2 diabetes mellitus with other specified complication: Secondary | ICD-10-CM

## 2022-05-17 DIAGNOSIS — Z79899 Other long term (current) drug therapy: Secondary | ICD-10-CM

## 2022-05-17 DIAGNOSIS — I10 Essential (primary) hypertension: Secondary | ICD-10-CM

## 2022-05-17 DIAGNOSIS — E1122 Type 2 diabetes mellitus with diabetic chronic kidney disease: Secondary | ICD-10-CM

## 2022-05-17 DIAGNOSIS — F17200 Nicotine dependence, unspecified, uncomplicated: Secondary | ICD-10-CM

## 2022-05-17 DIAGNOSIS — J449 Chronic obstructive pulmonary disease, unspecified: Secondary | ICD-10-CM

## 2022-05-17 DIAGNOSIS — E559 Vitamin D deficiency, unspecified: Secondary | ICD-10-CM

## 2022-05-29 NOTE — Progress Notes (Unsigned)
FOLLOW UP 3 MONTH  Assessment and Plan:  Essential hypertension - continue medications, DASH diet, exercise and monitor at home. Call if greater than 130/80.  -     CBC with Differential/Platelet -     CMP/GFR  Chronic obstructive pulmonary disease, unspecified COPD type ([place]) Advised to stop smoking, continue meds/Breo. May add mucinex if needed.  Patient has 40+ pack year smoking hx. Discussed progressive nature with continued smoking.    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 - discussed LDL goal <70 - Currently taking Rosuvastatin 5 mg and zetia 10 mg  -    Lipid panel   OSA with CPAP Has been wearing CPAP regularly since July after sleep study revealed OSA  Erectile dysfunction associated with type 2 diabetes mellitus (HCC) Control sugars, cialis PRN works well   Tobacco use disorder (40 + pack year current smoker) Smoking cessation-  instruction/counseling given, counseled patient on the dangers of tobacco use, advised patient to stop smoking, and reviewed strategies to maximize success, patient not ready to quit at this time.   Declines lung cancer screening -lung cancer screening with low dose CT discussed as recommended by guidelines based on age, number of pack year history.  Discussed risks of screening including but not limited to false positives on xray, further testing or consultation with  specialist, and possible false negative CT as well. Benefits of early detection of lung cancer discussed. He is not interested in pursuing any lung cancer screening at this time.   Medication management -     Microalbumin/creatinine urine ratio  Vitamin D deficiency Restart once weekly 50000 IU per patient preference, was well controlled with this dose per history review.   Morbid obesity ([place]) - follow up 4 months for progress monitoring - increase veggies, decrease carbs - long discussion about weight loss, diet, and exercise     Discussed med's effects and SE's. Screening labs and tests as requested with regular follow-up as recommended. Future Appointments  Date Time Provider [place]  05/30/2022  2:45 PM Alycia Rossetti, NP [facility] None  06/06/2022  3:00 PM [facility] 1 [facility]  06/20/2022  1:00 PM Flippin, Mammie Lorenzo, Counselor [facility] None  06/20/2022  2:00 PM [facility] LAB [facility] None  02/01/2023  2:00 PM Alycia Rossetti, NP [facility] None    HPI 66 y.o. male  presents for a three motnh follow up on HTN, HLD, T2DM, morbid obesity, smoking, vitamin D def.   He is a heavy smoker, 1-2 packs for many years with 40+ pack year hisotry. He has asthma/COPD, is on breo, did not do well with anoro (also reports did try trellegy ellipta, breztri, didn't like). Continues to use Albuterol PRN.  Has morning productive cough of white mucus from smoking and mouth dryness from CPAP.   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 Last lung imaging was CXR in 2009, hyperinflation. Continues to refuse lung CA screening  He is having pain in the right shoulder , worse with raising of arm above head.  Occurs on the posterior side and can be sharp shooting  or aching. Has been using Voltaren gel with little results.   He has noticed a place on his face which is getting bigger and becoming white in  color.  Would like to have removed.  BMI is There is no height or weight on file to calculate BMI., he has not been working on diet and exercise other than cutting out sweets in the last 5 weeks. Due to his obesity/low testosterone he has ED and uses cialis PRN.   Wt Readings from Last 3 Encounters:  04/11/22 262 lb (118.8 kg)  03/14/22 262 lb (118.8 kg)  01/31/22 261 lb 9.6 oz (118.7 kg)    His blood pressure has been controlled at home, today their BP is   BP Readings from Last 3 Encounters:  04/11/22 (!) 132/54  01/31/22 102/72  10/26/21 100/72    He does not workout. He denies chest pain, shortness of breath, dizziness.   He is on cholesterol medication, ls on Crestor 5 mg QD   His cholesterol is not at goal, less than 70. The cholesterol last visit was:   Lab Results  Component Value Date   CHOL 138 01/31/2022   HDL 33 (L) 01/31/2022   LDLCALC 69 01/31/2022   TRIG 270 (H) 01/31/2022   CHOLHDL 4.2 01/31/2022   He has not been working on diet and exercise for diabetes. 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  With hyperlipidemia not at LDL goal, is on statin denies foot ulcerations, hypoglycemia , paresthesia of the feet, polydipsia, polyuria and visual disturbances.  He is on ACE and on bASA  On metformin two pills with supper a day (had diarrhea with 4 tabs/day even spread) and glimepiride 4 he is on 1/2 mg BID Has not been checking sugars.  Tried invokana but could not tolerate it due to yeast. Last A1C in the office was:  Lab Results  Component Value Date   HGBA1C 9.6 (H) 01/31/2022   CKD II  Lab Results  Component Value Date   GFRNONAA 92 04/20/2021   Patient is on Vitamin D supplement, was on 50000 IU weekly but did run out after last OV.  Lab Results  Component Value Date   VD25OH 37 01/31/2022    Current Medications:   Current Outpatient Medications (Endocrine & Metabolic):    glimepiride (AMARYL) 4 MG tablet, TAKE 1/2 TO 1 TABLET 2 X /DAY  WITH MEALS FOR DIABETES   metFORMIN (GLUCOPHAGE-XR) 500 MG 24 hr tablet, TAKE 2 TABLETS 2 X DAY WITH MEALS FOR DIABETES   tirzepatide (MOUNJARO) 5 MG/0.5ML Pen, Inject 5 mg into the skin once a week.  Current Outpatient Medications (Cardiovascular):    losartan (COZAAR) 50 MG tablet, TAKE 1 TABLET BY MOUTH EVERY DAY FOR BLOOD PRESSURE   rosuvastatin (CRESTOR) 5 MG tablet, TAKE 1 TABLET (5 MG TOTAL) BY MOUTH DAILY.   tadalafil (CIALIS) 20 MG tablet, TAKE 1/2 TO 1 TABLET EVERY 2 TO 3 DAYS IF NEEDED  Current Outpatient Medications (Respiratory):    albuterol (PROAIR HFA) 108 (90 Base) MCG/ACT inhaler, Use  2 inhalations  15 minutes  apart every 4 hours  as needed to Rescue   fluticasone (  FLONASE) 50 MCG/ACT nasal spray, USE 1 SPRAY(S) INTRANASALLY ONCE A DAY AS NEEDED   fluticasone furoate-vilanterol (BREO ELLIPTA) 100-25 MCG/ACT AEPB, INHALE 1 PUFF BY MOUTH EVERY DAY    Current Outpatient Medications (Other):    glucose blood test strip, 1 each by Other route as needed for other. Test Blood Sugar Once daily   Vitamin D, Ergocalciferol, (DRISDOL) 1.25 MG (50000 UNIT) CAPS capsule, 1 capsule once a week for Vitamin D Deficiency  Medical History:  Past Medical History:  Diagnosis Date   Arthritis    LEFT shoulder/RIGHT hand   Asthma    uses inhaler   COPD (chronic obstructive pulmonary disease) ([place])    stage 1   Erectile dysfunction    Hyperlipidemia    on meds   Hypertension    on meds   Other testicular hypofunction    Sleep apnea    uses CPAP nightly   Type II or unspecified type diabetes mellitus without mention of complication, not stated as uncontrolled    on meds   Allergies Allergies  Allergen Reactions   Flaxseed (Linseed)     Other reaction(s): Other Bad after taste.   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    Review of Systems  Constitutional:  Negative for malaise/fatigue and weight loss.  HENT:  Negative for hearing loss and tinnitus.   Eyes:  Negative for blurred vision and double vision.  Respiratory:  Positive for shortness of breath (with heat/humidity changes). Negative for cough, sputum production and wheezing.   Cardiovascular:  Negative for chest pain, palpitations, orthopnea, claudication and leg swelling.  Gastrointestinal:  Negative for abdominal pain, blood in stool, constipation, diarrhea, heartburn, melena, nausea and vomiting.  Genitourinary: Negative.  Negative for dysuria.  Musculoskeletal:  Positive for joint pain (Left Shoulder). Negative for myalgias.  Skin:  Negative for rash.       Lesion on face increasing in size  Neurological:  Negative for dizziness, tingling, sensory change, weakness and headaches.  Endo/Heme/Allergies:  Negative for polydipsia.  Psychiatric/Behavioral: Negative.  Negative for depression.   All other systems reviewed and are negative.    Physical Exam: Estimated body mass index is 39.26 kg/m as calculated from the following:   Height as of 04/11/22: 5' 8.5" (1.74 m).   Weight as of 04/11/22: 262 lb (118.8 kg). There were no vitals filed for this visit.   General Appearance: Obese male , in no apparent distress. Eyes: PERRLA, EOMs, conjunctiva no swelling or erythema, normal fundi and vessels. Sinuses: No Frontal/maxillary tenderness ENT/Mouth: Ext aud canals clear, normal light reflex with TMs without erythema, bulging. Good dentition. No erythema, swelling, or exudate on post pharynx. Tonsils not swollen or erythematous. Hearing normal.  Neck: Supple, thyroid normal. No bruits Respiratory: Respiratory effort normal, BS equal bilaterally with mild expiratory wheeze clears with cough, without rales, rhonchi, or stridor. Cardio: RRR without murmurs, rubs or gallops. Brisk peripheral pulses without edema.  Abdomen: Soft, obese +BS. Non tender,  no guarding, rebound, hernias, masses, or organomegaly. .  Lymphatics: Non tender without lymphadenopathy.  Musculoskeletal: Symmetrical ROM excepting Left shoulder unable to raise above head, tenderness, possible bursitis on left posterior shoulder Skin: Warm, dry . 1 cm white scaly lesion on right cheek Neuro: Cranial nerves intact, reflexes equal bilaterally. Normal muscle tone, no cerebellar symptoms. Sensation intact.  Psych: Awake and oriented  X 3, normal affect, Insight and Judgment appropriate.   Alycia Rossetti, NP 1:48 PM [facility]

## 2022-05-30 ENCOUNTER — Ambulatory Visit: Payer: 59 | Admitting: Nurse Practitioner

## 2022-05-30 ENCOUNTER — Encounter: Payer: Self-pay | Admitting: Nurse Practitioner

## 2022-05-30 VITALS — BP 110/72 | HR 83 | Temp 97.9°F | Ht 68.5 in | Wt 250.0 lb

## 2022-05-30 DIAGNOSIS — N521 Erectile dysfunction due to diseases classified elsewhere: Secondary | ICD-10-CM

## 2022-05-30 DIAGNOSIS — E1122 Type 2 diabetes mellitus with diabetic chronic kidney disease: Secondary | ICD-10-CM

## 2022-05-30 DIAGNOSIS — E1169 Type 2 diabetes mellitus with other specified complication: Secondary | ICD-10-CM

## 2022-05-30 DIAGNOSIS — J449 Chronic obstructive pulmonary disease, unspecified: Secondary | ICD-10-CM

## 2022-05-30 DIAGNOSIS — Z79899 Other long term (current) drug therapy: Secondary | ICD-10-CM

## 2022-05-30 DIAGNOSIS — E559 Vitamin D deficiency, unspecified: Secondary | ICD-10-CM

## 2022-05-30 DIAGNOSIS — I10 Essential (primary) hypertension: Secondary | ICD-10-CM

## 2022-05-30 DIAGNOSIS — G4733 Obstructive sleep apnea (adult) (pediatric): Secondary | ICD-10-CM

## 2022-05-30 DIAGNOSIS — N182 Chronic kidney disease, stage 2 (mild): Secondary | ICD-10-CM

## 2022-05-30 DIAGNOSIS — E785 Hyperlipidemia, unspecified: Secondary | ICD-10-CM

## 2022-05-30 DIAGNOSIS — F17200 Nicotine dependence, unspecified, uncomplicated: Secondary | ICD-10-CM

## 2022-05-30 MED ORDER — GLUCOSE BLOOD VI STRP: ORAL_STRIP | 12 refills | Status: DC

## 2022-05-31 LAB — COMPLETE METABOLIC PANEL WITH GFR
AG Ratio: 1.6 (calc) (ref 1.0–2.5)
ALT: 18 U/L (ref 9–46)
AST: 15 U/L (ref 10–35)
Albumin: 4.4 g/dL (ref 3.6–5.1)
Alkaline phosphatase (APISO): 69 U/L (ref 35–144)
BUN: 16 mg/dL (ref 7–25)
CO2: 24 mmol/L (ref 20–32)
Calcium: 9.3 mg/dL (ref 8.6–10.3)
Chloride: 104 mmol/L (ref 98–110)
Creat: 1.06 mg/dL (ref 0.70–1.35)
Globulin: 2.7 g/dL (calc) (ref 1.9–3.7)
Glucose, Bld: 78 mg/dL (ref 65–99)
Potassium: 4.3 mmol/L (ref 3.5–5.3)
Sodium: 137 mmol/L (ref 135–146)
Total Bilirubin: 0.5 mg/dL (ref 0.2–1.2)
Total Protein: 7.1 g/dL (ref 6.1–8.1)
eGFR: 78 mL/min/{1.73_m2} (ref >=60)

## 2022-05-31 LAB — CBC WITH DIFFERENTIAL/PLATELET
Absolute Monocytes: 693 cells/uL (ref 200–950)
Basophils Absolute: 32 cells/uL (ref 0–200)
Basophils Relative: 0.3 %
Eosinophils Absolute: 84 cells/uL (ref 15–500)
Eosinophils Relative: 0.8 %
HCT: 42 % (ref 38.5–50.0)
Hemoglobin: 14.3 g/dL (ref 13.2–17.1)
Lymphs Abs: 2720 cells/uL (ref 850–3900)
MCH: 29.8 pg (ref 27.0–33.0)
MCHC: 34 g/dL (ref 32.0–36.0)
MCV: 87.5 fL (ref 80.0–100.0)
MPV: 9.9 fL (ref 7.5–12.5)
Monocytes Relative: 6.6 %
Neutro Abs: 6972 cells/uL (ref 1500–7800)
Neutrophils Relative %: 66.4 %
Platelets: 205 10*3/uL (ref 140–400)
RBC: 4.8 10*6/uL (ref 4.20–5.80)
RDW: 13 % (ref 11.0–15.0)
Total Lymphocyte: 25.9 %
WBC: 10.5 10*3/uL (ref 3.8–10.8)

## 2022-05-31 LAB — LIPID PANEL
Cholesterol: 97 mg/dL (ref <200)
HDL: 30 mg/dL — ABNORMAL LOW (ref >=40)
LDL Cholesterol (Calc): 48 mg/dL (calc)
Non-HDL Cholesterol (Calc): 67 mg/dL (calc) (ref <130)
Total CHOL/HDL Ratio: 3.2 (calc) (ref <5.0)
Triglycerides: 111 mg/dL (ref <150)

## 2022-05-31 LAB — HEMOGLOBIN A1C
Hgb A1c MFr Bld: 8.3 % of total Hgb — ABNORMAL HIGH (ref <5.7)
Mean Plasma Glucose: 192 mg/dL
eAG (mmol/L): 10.6 mmol/L

## 2022-06-06 ENCOUNTER — Ambulatory Visit
Admission: RE | Admit: 2022-06-06 | Discharge: 2022-06-06 | Disposition: A | Discharge status: Home / Self Care | Payer: 59 | Source: Ambulatory Visit | Attending: Nurse Practitioner | Admitting: Nurse Practitioner

## 2022-06-06 DIAGNOSIS — F17200 Nicotine dependence, unspecified, uncomplicated: Secondary | ICD-10-CM

## 2022-06-08 ENCOUNTER — Other Ambulatory Visit: Payer: Self-pay | Admitting: Nurse Practitioner

## 2022-06-08 DIAGNOSIS — R911 Solitary pulmonary nodule: Secondary | ICD-10-CM

## 2022-06-20 ENCOUNTER — Encounter: Payer: Self-pay | Admitting: Genetic Counselor

## 2022-06-20 ENCOUNTER — Inpatient Hospital Stay: Payer: 59

## 2022-06-20 ENCOUNTER — Inpatient Hospital Stay: Payer: 59 | Attending: Internal Medicine | Admitting: Genetic Counselor

## 2022-06-20 ENCOUNTER — Other Ambulatory Visit: Payer: Self-pay

## 2022-06-20 DIAGNOSIS — Z860100 Personal history of colon polyps, unspecified: Secondary | ICD-10-CM | POA: Insufficient documentation

## 2022-06-20 NOTE — Progress Notes (Signed)
REFERRING PROVIDER: Lavena Bullion, DO [place],  [place] [address]  PRIMARY PROVIDER:  Unk Pinto, MD  PRIMARY REASON FOR VISIT:  1. Personal history of colonic polyps    HISTORY OF PRESENT ILLNESS:   Mr. Luis Bonilla, a 66 y.o. male, was seen for a [facility] cancer genetics consultation at the request of Dr. Bryan Lemma due to a personal history of colon polyps.  Mr. Upchurch presents to clinic today to discuss the possibility of a hereditary predisposition to cancer, to discuss genetic testing, and to further clarify his future cancer risks, as well as potential cancer risks for family members.   Mr. Winthrop is a 66 y.o. male with no personal history of cancer. 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  Past Medical History:  Diagnosis Date   Arthritis    LEFT shoulder/RIGHT hand   Asthma    uses inhaler   COPD (chronic obstructive pulmonary disease) (HCC)    stage 1   Erectile dysfunction    Hyperlipidemia    on meds   Hypertension    on meds   Other testicular hypofunction    Sleep apnea    uses CPAP nightly   Type II or unspecified type diabetes mellitus without mention of complication, not stated as uncontrolled    on meds    Past Surgical History:  Procedure Laterality Date   DENTAL SURGERY     VASECTOMY      Social History   Socioeconomic History   Marital status: Married    Spouse name: Not on file   Number of children: Not on file   Years of education: Not on file   Highest education level: Not on file  Occupational History   Not on file  Tobacco Use   Smoking status: Every Day    Packs/day: 2.00    Years: 21.00    Total pack years: 42.00    Types: Cigarettes    Start date: 10/24/1999   Smokeless tobacco: Never  Vaping Use   Vaping Use: Never used  Substance and Sexual Activity   Alcohol use: No   Drug use: No   Sexual activity: Not on file  Other Topics Concern   Not on file   Social History Narrative   Not on file   Social Determinants of Health   Financial Resource Strain: Not on file  Food Insecurity: Not on file  Transportation Needs: Not on file  Physical Activity: Not on file  Stress: Not on file  Social Connections: Not on file     FAMILY HISTORY:  We obtained a detailed, 4-generation family history.  Significant diagnoses are listed below: Family History  Problem Relation Age of Onset   Stroke Mother    Hypertension Mother    Diabetes Mother    Lung cancer Father 13       smoked   Throat cancer Maternal Grandmother 58       smoked   Colon polyps Neg Hx    Colon cancer Neg Hx    Esophageal cancer Neg Hx    Stomach cancer Neg Hx    Rectal cancer Neg Hx        Mr. Keizer's maternal grandmother was diagnosed with throat cancer at age 31, she smoked, she died at age 47. His father was diagnosed with lung cancer at age 91, he smoked, he died at age 75. Mr. Follett is unaware of previous family history of genetic  testing for hereditary cancer risks. There is no reported Ashkenazi Jewish ancestry.  GENETIC COUNSELING ASSESSMENT: Mr. Sahakian is a 66 y.o. male with a personal history of colon polyps which is somewhat suggestive of a hereditary predisposition to cancer given his personal history of >10 tubular adenomas. We, therefore, discussed and recommended the following at today's visit.   DISCUSSION: We discussed that 5 - 10% of cancer is hereditary, with most cases of polyposis associated with APC and MUTYH genes.  There are other genes that can be associated with hereditary polyposis syndromes.  We discussed that testing is beneficial for several reasons including knowing how to follow individuals for cancer screening and understanding if other family members could be at an increased risk for polyposis/cancer.   We reviewed the characteristics, features and inheritance patterns of hereditary cancer syndromes. We also discussed genetic testing,  including the appropriate family members to test, the process of testing, insurance coverage and turn-around-time for results. We discussed the implications of a negative, positive, carrier and/or variant of uncertain significant result. We recommended Mr. Mccamish pursue genetic testing for a panel that includes genes associated with polyposis.   Mr. Heslin  was offered a common hereditary cancer panel (36 genes) and an expanded pan-cancer panel (77 genes). Mr. Berg was informed of the benefits and limitations of each panel, including that expanded pan-cancer panels contain genes that do not have clear management guidelines at this point in time.  We also discussed that as the number of genes included on a panel increases, the chances of variants of uncertain significance increases. After considering the benefits and limitations of each gene panel, Mr. Felmlee elected to have [facility].  The CancerNext gene panel offered by [employer] includes sequencing, rearrangement analysis, and RNA analysis for the following 36 genes:   APC, ATM, AXIN2, BARD1, BMPR1A, BRCA1, BRCA2, BRIP1, CDH1, CDK4, CDKN2A, CHEK2, DICER1, HOXB13, EPCAM, GREM1, MLH1, MSH2, MSH3, MSH6, MUTYH, NBN, NF1, NTHL1, PALB2, PMS2, POLD1, POLE, PTEN, RAD51C, RAD51D, RECQL, SMAD4, SMARCA4, STK11, and TP53.   Based on Mr. Miyoshi personal history of >10 tubular adenomas, he meets medical criteria for genetic testing. Despite that he meets criteria, he may still have an out of pocket cost. We discussed that if his out of pocket cost for testing is over $100, the laboratory will call and confirm whether he wants to proceed with testing.  If the out of pocket cost of testing is less than $100 he will be billed by the genetic testing laboratory.   PLAN: After considering the risks, benefits, and limitations, Mr. Nuzum provided informed consent to pursue genetic testing and the blood sample was sent to [facility] for analysis of the  [place]. Results should be available within approximately 2-3 weeks' time, at which point they will be disclosed by telephone to Mr. Walmer, as will any additional recommendations warranted by these results. Mr. Herrod will receive a summary of his genetic counseling visit and a copy of his results once available. This information will also be available in Epic.   Mr. Carden questions were answered to his satisfaction today. Our contact information was provided should additional questions or concerns arise. Thank you for the referral and allowing [place] to share in the care of your patient.   Lucille Passy, MS, [facility] Genetic Counselor [email or web address] (P) [phone]  The patient was seen for a total of 30 minutes in face-to-face genetic counseling. The patient was seen alone.  Drs. Lindi Adie and/or Burr Medico were available to discuss this  case as needed.   _______________________________________________________________________ For Office Staff:  Number of people involved in session: 1 Was an Intern/ student involved with case: yes- [place] observed

## 2022-07-14 ENCOUNTER — Telehealth: Payer: Self-pay | Admitting: Genetic Counselor

## 2022-07-14 ENCOUNTER — Encounter: Payer: Self-pay | Admitting: Genetic Counselor

## 2022-07-14 DIAGNOSIS — Z1379 Encounter for other screening for genetic and chromosomal anomalies: Secondary | ICD-10-CM | POA: Insufficient documentation

## 2022-07-14 NOTE — Telephone Encounter (Signed)
I contacted Mr. Bazen to discuss his genetic testing results. No pathogenic variants were identified in the 36 genes analyzed. 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 Detailed clinic note to follow.  The test report has been scanned into EPIC and is located under the Molecular Pathology section of the Results Review tab.  A portion of the result report is included below for reference.   Lucille Passy, MS, [facility] Genetic Counselor [email or web address] (P) [phone]

## 2022-07-24 NOTE — Progress Notes (Signed)
FOLLOW UP 3 MONTH  Assessment and Plan:  Essential hypertension - continue medications, DASH diet, exercise and monitor at home. Call if greater than 130/80.  -     CBC with Differential/Platelet -     CMP/GFR  Chronic obstructive pulmonary disease, unspecified COPD type ([place]) Advised to stop smoking, continue meds/Breo. May add mucinex if needed.  Patient has 40+ pack year smoking hx. Discussed progressive nature with continued smoking.    Diabetes mellitus with CKD ([place]) Has been trying to eat healthier, has not been exercising Currently on Metformin XR '1000mg'$  at supper Mounjaro 5 mg weeklu Will d/c glimiperide as blood sugars are running 60-70 fasting.  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 - discussed LDL goal <70 - Currently taking Rosuvastatin 5 mg and zetia 10 mg  -    Lipid panel   Medication management -    TSH   Morbid obesity ([place]) - follow up 4 months for progress monitoring - increase veggies, decrease carbs - long discussion about weight loss, diet, and exercise     Discussed med's effects and SE's. Screening labs and tests as requested with regular follow-up as recommended. Future Appointments  Date Time Provider [place]  07/26/2022  2:00 PM Mcarthur Rossetti, MD [facility] None  02/01/2023  2:00 PM Alycia Rossetti, NP [facility] None    HPI 66 y.o. male  presents for a three motnh follow up on HTN,  HLD, T2DM, morbid obesity, smoking, vitamin D def.   He is a heavy smoker, 1-2 packs for many years with 40+ pack year hisotry. He has asthma/COPD, is on breo, did not do well with anoro (also reports did try trellegy ellipta, breztri, didn't like). Continues to use Albuterol PRN.  Has morning productive cough of white mucus from smoking and mouth dryness from CPAP.   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 Last lung imaging was CXR in 2009, .  BMI is Body mass index is 34.13 kg/m. s. Due to his obesity/low testosterone he has ED and uses cialis PRN.  He is on Mounjaro 5 mg daily. He has not eaten any sweets since starting Mounjaro and his portion sare markedly smaller. He is down 23 pounds in the past 2 months Wt Readings from Last 3 Encounters:  07/25/22 227 lb 12.8 oz (103.3 kg)  05/30/22 250 lb (113.4 kg)  04/11/22 262 lb (118.8 kg)    His blood pressure has been controlled at home, today their BP is BP: 126/78 BP Readings from Last 3 Encounters:  07/25/22 126/78  05/30/22 110/72  04/11/22 (!) 132/54    He does not workout. He denies chest pain, shortness of breath, dizziness.   He is on cholesterol medication, ls on Crestor 5 mg QD   His cholesterol is not at goal, less than 70. The cholesterol last  visit was:   Lab Results  Component Value Date   CHOL 97 05/30/2022   HDL 30 (L) 05/30/2022   LDLCALC 48 05/30/2022   TRIG 111 05/30/2022   CHOLHDL 3.2 05/30/2022   He has not been working on diet and exercise for diabetes. 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  With hyperlipidemia not at LDL goal, is on statin denies foot ulcerations, hypoglycemia , paresthesia of the feet, polydipsia, polyuria and visual disturbances.  He is on ACE and on bASA  On metformin two pills with supper a day (had diarrhea with 4 tabs/day even spread) He is on Mounjaro 5 mg daily.He has been taking Glipizide '4mg'$  1/2 tab  BID and his blood sugars are running low in 60 and 70's fasting Has not been checking blood sugars, needs strips Tried invokana but could not tolerate it due to yeast. Last A1C in the office was:  Lab Results  Component Value Date   HGBA1C 8.3 (H) 05/30/2022   CKD II  Lab Results  Component Value Date   GFRNONAA 92 04/20/2021   Patient is on Vitamin D supplement, was on 50000 IU weekly but did run out after last OV.  Lab Results  Component Value Date   VD25OH 37 01/31/2022    Current Medications:   Current Outpatient Medications (Endocrine & Metabolic):    glimepiride (AMARYL) 4 MG tablet, TAKE 1/2 TO 1 TABLET 2 X /DAY WITH MEALS FOR DIABETES   metFORMIN (GLUCOPHAGE-XR) 500 MG 24 hr tablet, TAKE 2 TABLETS 2 X DAY WITH MEALS FOR DIABETES   tirzepatide (MOUNJARO) 5 MG/0.5ML Pen, Inject 5 mg into the skin once a week.  Current Outpatient Medications (Cardiovascular):    losartan (COZAAR) 50 MG tablet, TAKE 1 TABLET BY MOUTH EVERY DAY FOR BLOOD PRESSURE   rosuvastatin (CRESTOR) 5 MG tablet, TAKE 1 TABLET (5 MG TOTAL) BY MOUTH DAILY.   tadalafil (CIALIS) 20 MG tablet, TAKE 1/2 TO 1 TABLET EVERY 2 TO 3 DAYS IF NEEDED  Current Outpatient Medications (Respiratory):    albuterol (PROAIR HFA) 108 (90 Base) MCG/ACT inhaler, Use  2 inhalations  15 minutes  apart every 4 hours  as needed to Rescue   fluticasone furoate-vilanterol (BREO ELLIPTA) 100-25 MCG/ACT AEPB, INHALE 1 PUFF BY MOUTH EVERY DAY    Current Outpatient Medications (Other):    glucose blood test strip, Check blood sugars fasting daily   Vitamin D, Ergocalciferol, (DRISDOL) 1.25 MG (50000 UNIT) CAPS capsule, 1 capsule once a week for Vitamin D Deficiency  Medical History:  Past Medical History:  Diagnosis Date   Arthritis    LEFT shoulder/RIGHT hand   Asthma    uses inhaler   COPD (chronic obstructive pulmonary disease) (HCC)    stage 1   Erectile dysfunction    Hyperlipidemia    on meds   Hypertension    on  meds   Other testicular hypofunction    Sleep apnea    uses CPAP nightly   Type II or unspecified type diabetes mellitus without mention of complication, not stated as uncontrolled    on meds   Allergies Allergies  Allergen Reactions   Flaxseed (Linseed)     Other reaction(s): Other Bad after taste.   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   Review of Systems  Constitutional:  Negative for malaise/fatigue  and weight loss.  HENT:  Negative for hearing loss and tinnitus.   Eyes:  Negative for blurred vision and double vision.  Respiratory:  Positive for shortness of breath (with heat/humidity changes). Negative for cough, sputum production and wheezing.   Cardiovascular:  Negative for chest pain, palpitations, orthopnea, claudication and leg swelling.  Gastrointestinal:  Negative for abdominal pain, blood in stool, constipation, diarrhea, heartburn, melena, nausea and vomiting.  Genitourinary: Negative.  Negative for dysuria.  Musculoskeletal:  Negative for myalgias.  Skin:  Negative for rash.  Neurological:  Negative for dizziness, tingling, sensory change, weakness and headaches.  Endo/Heme/Allergies:  Negative for polydipsia.  Psychiatric/Behavioral: Negative.  Negative for depression.   All other systems reviewed and are negative.    Physical Exam: Estimated body mass index is 34.13 kg/m as calculated from the following:   Height as of this encounter: 5' 8.5" (1.74 m).   Weight as of this encounter: 227 lb 12.8 oz (103.3 kg). Vitals:   07/25/22 1055  BP: 126/78  Pulse: 93  Temp: 97.7 F (36.5 C)  SpO2: 95%     General Appearance: Obese male , in no apparent distress. Eyes: PERRLA, EOMs, conjunctiva no swelling or erythema, normal fundi and vessels. Sinuses: No Frontal/maxillary tenderness ENT/Mouth: Ext aud canals  clear, normal light reflex with TMs without erythema, bulging. Good dentition. No erythema, swelling, or exudate on post pharynx. Tonsils not swollen or erythematous. Hearing normal.  Neck: Supple, thyroid normal. No bruits Respiratory: Respiratory effort normal, BS equal bilaterally with mild expiratory wheeze clears with cough, without rales, rhonchi, or stridor. Cardio: RRR without murmurs, rubs or gallops. Brisk peripheral pulses without edema.  Abdomen: Soft, obese +BS. Non tender, no guarding, rebound, hernias, masses, or organomegaly. .  Lymphatics: Non tender without lymphadenopathy.  Musculoskeletal: Symmetrical ROM  and strength Skin: Warm, dry . NO lesions Neuro: Cranial nerves intact, reflexes equal bilaterally. Normal muscle tone, no cerebellar symptoms. Sensation intact.  Psych: Awake and oriented X 3, normal affect, Insight and Judgment appropriate.   Alycia Rossetti, NP 11:09 AM [facility]

## 2022-07-25 ENCOUNTER — Encounter: Payer: Self-pay | Admitting: Nurse Practitioner

## 2022-07-25 ENCOUNTER — Ambulatory Visit: Payer: 59 | Admitting: Nurse Practitioner

## 2022-07-25 VITALS — BP 126/78 | HR 93 | Temp 97.7°F | Ht 68.5 in | Wt 227.8 lb

## 2022-07-25 DIAGNOSIS — E1122 Type 2 diabetes mellitus with diabetic chronic kidney disease: Secondary | ICD-10-CM

## 2022-07-25 DIAGNOSIS — J449 Chronic obstructive pulmonary disease, unspecified: Secondary | ICD-10-CM

## 2022-07-25 DIAGNOSIS — I10 Essential (primary) hypertension: Secondary | ICD-10-CM | POA: Diagnosis not present

## 2022-07-25 DIAGNOSIS — N182 Chronic kidney disease, stage 2 (mild): Secondary | ICD-10-CM

## 2022-07-25 DIAGNOSIS — E785 Hyperlipidemia, unspecified: Secondary | ICD-10-CM

## 2022-07-25 DIAGNOSIS — Z79899 Other long term (current) drug therapy: Secondary | ICD-10-CM

## 2022-07-25 DIAGNOSIS — E1169 Type 2 diabetes mellitus with other specified complication: Secondary | ICD-10-CM

## 2022-07-25 MED ORDER — MOUNJARO 7.5 MG/0.5ML ~~LOC~~ SOAJ: 7.5000 mg | SUBCUTANEOUS | 3 refills | Status: DC

## 2022-07-25 NOTE — Patient Instructions (Signed)
Stop Glimepiride. Will increase Mounjaro to 7.5 mg weekly.  Finish your current supply and the restart If fasting blood sugars are greater than 140 call the office and may need to restart a small dose of glimepiride   Diabetes Mellitus Action Plan Following a diabetes action plan is a way for you to manage your diabetes (diabetes mellitus) symptoms. The plan is color-coded to help you understand what actions you need to take based on any symptoms you are having. If you have symptoms in the red zone, you need medical care right away. If you have symptoms in the yellow zone, you are having problems. If you have symptoms in the green zone, you are doing well. Learning about and understanding diabetes can take time. Follow the plan that you develop with your health care provider. Know the target range for your blood sugar (glucose) level, and review your treatment plan with your health care provider at each visit. The target range for my blood sugar level is ________<120__________________ mg/dL. Red zone Get medical help right away if you have any of the following symptoms: A blood sugar test result that is below 54 mg/dL (3 mmol/L). A blood sugar test result that is at or above 240 mg/dL (13.3 mmol/L) for 2 days in a row. Confusion or trouble thinking clearly. Difficulty breathing. Sickness or a fever for 2 or more days that is not getting better. Moderate or large ketone levels in your urine. Feeling tired or having no energy. If you have any red zone symptoms, do not wait to see if the symptoms will go away. Get medical help right away. Call your local emergency services (911 in the [place] Do not drive yourself to the hospital. If you have severely low blood sugar (severe hypoglycemia) and you cannot eat or drink, you may need glucagon. Make sure a family member or close friend knows how to check your blood sugar and how to give you glucagon. You may need to be treated in a hospital for this  condition. Yellow zone If you have any of the following symptoms, your diabetes is not under control and you may need to make some changes: A blood sugar test result that is at or above 240 mg/dL (13.3 mmol/L) for 2 days in a row. Blood sugar test results that are below 70 mg/dL (3.9 mmol/L). Other symptoms of hypoglycemia, such as: Shaking or feeling light-headed. Confusion or irritability. Feeling hungry. Having a fast heartbeat. If you have any yellow zone symptoms: Treat your hypoglycemia by eating or drinking 15 grams of a rapid-acting carbohydrate. Follow the 15:15 rule: Take 15 grams of a rapid-acting carbohydrate, such as: 1 tube of glucose gel. 4 glucose pills. 4 oz (120 mL) of fruit juice. 4 oz (120 mL) of regular (not diet) soda. Check your blood sugar 15 minutes after you take the carbohydrate. If the repeat blood sugar test is still at or below 70 mg/dL (3.9 mmol/L), take 15 grams of a carbohydrate again. If your blood sugar does not increase above 70 mg/dL (3.9 mmol/L) after 3 tries, get medical help right away. After your blood sugar returns to normal, eat a meal or a snack within 1 hour. Keep taking your daily medicines as told by your health care provider. Check your blood sugar more often than you normally would. Write down your results. Call your health care provider if you have trouble keeping your blood sugar in your target range.  Green zone These signs mean you are doing well  and you can continue what you are doing to manage your diabetes: Your blood sugar is within your personal target range. For most people, a blood sugar level before a meal (preprandial) should be 80-130 mg/dL (4.4-7.2 mmol/L). You feel well, and you are able to do daily activities. If you are in the green zone, continue to manage your diabetes as told by your health care provider. To do this: Eat a healthy diet. Exercise regularly. Check your blood sugar as told by your health care  provider. Take your medicines as told by your health care provider.  Where to find more information American Diabetes Association (ADA): diabetes.org Association of Diabetes Care & Education Specialists (ADCES): diabeteseducator.org Summary Following a diabetes action plan is a way for you to manage your diabetes symptoms. The plan is color-coded to help you understand what actions you need to take based on any symptoms you are having. Follow the plan that you develop with your health care provider. Make sure you know your personal target blood sugar level. Review your treatment plan with your health care provider at each visit. This information is not intended to replace advice given to you by your health care provider. Make sure you discuss any questions you have with your health care provider. Document Revised: 04/15/2020 Document Reviewed: 04/15/2020 Elsevier Patient Education  [place].

## 2022-07-26 ENCOUNTER — Ambulatory Visit: Payer: Self-pay | Admitting: Genetic Counselor

## 2022-07-26 ENCOUNTER — Encounter: Payer: Self-pay | Admitting: Orthopaedic Surgery

## 2022-07-26 ENCOUNTER — Ambulatory Visit: Payer: 59 | Admitting: Orthopaedic Surgery

## 2022-07-26 DIAGNOSIS — M25512 Pain in left shoulder: Secondary | ICD-10-CM | POA: Diagnosis not present

## 2022-07-26 DIAGNOSIS — G8929 Other chronic pain: Secondary | ICD-10-CM | POA: Diagnosis not present

## 2022-07-26 DIAGNOSIS — Z1379 Encounter for other screening for genetic and chromosomal anomalies: Secondary | ICD-10-CM

## 2022-07-26 LAB — COMPLETE METABOLIC PANEL WITH GFR
AG Ratio: 1.6 (calc) (ref 1.0–2.5)
ALT: 11 U/L (ref 9–46)
AST: 12 U/L (ref 10–35)
Albumin: 4.5 g/dL (ref 3.6–5.1)
Alkaline phosphatase (APISO): 70 U/L (ref 35–144)
BUN: 16 mg/dL (ref 7–25)
CO2: 25 mmol/L (ref 20–32)
Calcium: 9.7 mg/dL (ref 8.6–10.3)
Chloride: 104 mmol/L (ref 98–110)
Creat: 1.09 mg/dL (ref 0.70–1.35)
Globulin: 2.8 g/dL (calc) (ref 1.9–3.7)
Glucose, Bld: 86 mg/dL (ref 65–99)
Potassium: 5 mmol/L (ref 3.5–5.3)
Sodium: 139 mmol/L (ref 135–146)
Total Bilirubin: 0.5 mg/dL (ref 0.2–1.2)
Total Protein: 7.3 g/dL (ref 6.1–8.1)
eGFR: 76 mL/min/{1.73_m2} (ref >=60)

## 2022-07-26 LAB — CBC WITH DIFFERENTIAL/PLATELET
Absolute Monocytes: 541 cells/uL (ref 200–950)
Basophils Absolute: 33 cells/uL (ref 0–200)
Basophils Relative: 0.4 %
Eosinophils Absolute: 57 cells/uL (ref 15–500)
Eosinophils Relative: 0.7 %
HCT: 43.9 % (ref 38.5–50.0)
Hemoglobin: 14.9 g/dL (ref 13.2–17.1)
Lymphs Abs: 2001 cells/uL (ref 850–3900)
MCH: 29.6 pg (ref 27.0–33.0)
MCHC: 33.9 g/dL (ref 32.0–36.0)
MCV: 87.3 fL (ref 80.0–100.0)
MPV: 9.9 fL (ref 7.5–12.5)
Monocytes Relative: 6.6 %
Neutro Abs: 5568 cells/uL (ref 1500–7800)
Neutrophils Relative %: 67.9 %
Platelets: 196 10*3/uL (ref 140–400)
RBC: 5.03 10*6/uL (ref 4.20–5.80)
RDW: 13.4 % (ref 11.0–15.0)
Total Lymphocyte: 24.4 %
WBC: 8.2 10*3/uL (ref 3.8–10.8)

## 2022-07-26 LAB — HEMOGLOBIN A1C
Hgb A1c MFr Bld: 5.5 % of total Hgb (ref <5.7)
Mean Plasma Glucose: 111 mg/dL
eAG (mmol/L): 6.2 mmol/L

## 2022-07-26 LAB — TSH: TSH: 2.24 mIU/L (ref 0.40–4.50)

## 2022-07-26 MED ORDER — METHYLPREDNISOLONE ACETATE 40 MG/ML IJ SUSP
40.0000 mg | INTRAMUSCULAR | Status: AC | PRN
  Administered 2022-07-26: 40 mg via INTRA_ARTICULAR

## 2022-07-26 MED ORDER — LIDOCAINE HCL 1 % IJ SOLN
3.0000 mL | INTRAMUSCULAR | Status: AC | PRN
  Administered 2022-07-26: 3 mL

## 2022-07-26 NOTE — Progress Notes (Signed)
Office Visit Note   Patient: Luis Bonilla           Date of Birth: 1956-04-12           MRN: [id number] Visit Date: 07/26/2022              Requested by: Unk Pinto, [address] [place],  [place] [address] PCP: Unk Pinto, MD   Assessment & Plan: Visit Diagnoses:  1. Chronic left shoulder pain     Plan: He does report improved motion recently with a home exercise program so I want him to continue that program.  I did recommend a steroid injection in the subacromial outlet of his left shoulder and described what I had recommending this.  He is someone who checks his blood glucose daily so he will continue to do this and understands that this may increase his blood glucose.  I then provided a steroid injection of the left shoulder subacromial outlet which he tolerated well.  He did have some improved pain after the lidocaine portion of the injection.  I would like to see him back in 4 weeks for repeat exam and see how he is doing overall.  Follow-Up Instructions: Return in about 4 weeks (around 08/23/2022).   Orders:  Orders Placed This Encounter  Procedures   Large Joint Inj   No orders of the defined types were placed in this encounter.     Procedures: Large Joint Inj: L subacromial bursa on 07/26/2022 2:06 PM Indications: pain and diagnostic evaluation Details: 22 G 1.5 in needle  Arthrogram: No  Medications: 3 mL lidocaine 1 %; 40 mg methylPREDNISolone acetate 40 MG/ML Outcome: tolerated well, no immediate complications Procedure, treatment alternatives, risks and benefits explained, specific risks discussed. Consent was given by the patient. Immediately prior to procedure a time out was called to verify the correct patient, procedure, equipment, support staff and site/side marked as required. Patient was prepped and draped in the usual sterile fashion.       Clinical Data: No additional findings.   Subjective: Chief Complaint   Patient presents with   Left Shoulder - Pain  The patient is a 66 year old gentleman seen for the first time.  He comes in with left shoulder pain and stiffness is going on for several months now.  He is a diabetic but has done a lot to lose weight and his hemoglobin A1c is now below 6.  He said it used to be over 8.  He denies any shoulder injury but he is having a really hard time reaching overhead and behind with that left side.  He has never had an injection in the shoulder either.  He says his worst pain is at night and it does wake him up at night or at times it is hard to get a comfortable position.  He cannot take anti-inflammatories due to chronic kidney issues.  He does try Tylenol PM.  HPI  Review of Systems There is currently listed no fever, chills, nausea, vomiting  Objective: Vital Signs: There were no vitals taken for this visit.  Physical Exam He is alert and orient x3 and in no acute distress Ortho Exam Examination of his left shoulder does show significant decrease in range of motion in terms of abduction as well as internal rotation and adduction and external rotation.  There is also some deficits in the rotator cuff and with abduction he is using more of his deltoid to abduct his shoulder. Specialty  Comments:  No specialty comments available.  Imaging: No results found. 3 views left shoulder on the canopy system showed no acute findings.  PMFS History: Patient Active Problem List   Diagnosis Date Noted   Genetic testing 07/14/2022   Personal history of colonic polyps 06/20/2022   Lung cancer screening declined by patient 04/20/2021   Hyperlipidemia associated with type 2 diabetes mellitus ([place]) 09/01/2019   Smoking greater than 40 pack years 04/10/2018   Type 2 diabetes with stage 2 chronic kidney disease GFR 60-89 ([place]) 04/09/2018   COPD (chronic obstructive pulmonary disease) ([place]) 07/03/2016   Vitamin D deficiency 04/12/2015   Medication management  04/12/2015   CKD stage 2 due to type 2 diabetes mellitus ([place]) 01/04/2015   Tobacco use disorder 01/04/2015   Erectile dysfunction associated with type 2 diabetes mellitus ([place]) 01/04/2015   Morbid obesity ([place]) 12/29/2013   Hypertension    Asthma    Hypogonadism in male    Past Medical History:  Diagnosis Date   Arthritis    LEFT shoulder/RIGHT hand   Asthma    uses inhaler   COPD (chronic obstructive pulmonary disease) (HCC)    stage 1   Erectile dysfunction    Hyperlipidemia    on meds   Hypertension    on meds   Other testicular hypofunction    Sleep apnea    uses CPAP nightly   Type II or unspecified type diabetes mellitus without mention of complication, not stated as uncontrolled    on meds    Family History  Problem Relation Age of Onset   Stroke Mother    Hypertension Mother    Diabetes Mother    Lung cancer Father 45       smoked   Throat cancer Maternal Grandmother 58       smoked   Colon polyps Neg Hx    Colon cancer Neg Hx    Esophageal cancer Neg Hx    Stomach cancer Neg Hx    Rectal cancer Neg Hx     Past Surgical History:  Procedure Laterality Date   DENTAL SURGERY     VASECTOMY     Social History   Occupational History   Not on file  Tobacco Use   Smoking status: Every Day    Packs/day: 2.00    Years: 21.00    Total pack years: 42.00    Types: Cigarettes    Start date: 10/24/1999   Smokeless tobacco: Never  Vaping Use   Vaping Use: Never used  Substance and Sexual Activity   Alcohol use: No   Drug use: No   Sexual activity: Not on file

## 2022-07-27 ENCOUNTER — Encounter: Payer: Self-pay | Admitting: Genetic Counselor

## 2022-07-27 NOTE — Progress Notes (Signed)
HPI:   Luis Bonilla was previously seen in the [place] clinic due to a personal history of colon polyps and concerns regarding a hereditary predisposition to cancer. Please refer to our prior cancer genetics clinic note for more information regarding our discussion, assessment and recommendations, at the time. Luis Bonilla recent genetic test results were disclosed to him, as were recommendations warranted by these results. These results and recommendations are discussed in more detail below.  CANCER HISTORY:  Oncology History   No history exists.    FAMILY HISTORY:  We obtained a detailed, 4-generation family history.  Significant diagnoses are listed below:      Family History  Problem Relation Age of Onset   Stroke Mother     Hypertension Mother     Diabetes Mother     Lung cancer Father 66        smoked   Throat cancer Maternal Grandmother 58        smoked   Colon polyps Neg Hx     Colon cancer Neg Hx     Esophageal cancer Neg Hx     Stomach cancer Neg Hx     Rectal cancer Neg Hx             Luis Bonilla maternal grandmother was diagnosed with throat cancer at age 77, she smoked, she died at age 107. His father was diagnosed with lung cancer at age 47, he smoked, he died at age 71. Luis Bonilla is unaware of previous family history of genetic testing for hereditary cancer risks. There is no reported Ashkenazi Jewish ancestry.  GENETIC TEST RESULTS:  The [facility] CancerNext Panel found no pathogenic mutations.   The CancerNext gene panel offered by [employer] includes sequencing, rearrangement analysis, and RNA analysis for the following 36 genes:   APC, ATM, AXIN2, BARD1, BMPR1A, BRCA1, BRCA2, BRIP1, CDH1, CDK4, CDKN2A, CHEK2, DICER1, HOXB13, EPCAM, GREM1, MLH1, MSH2, MSH3, MSH6, MUTYH, NBN, NF1, NTHL1, PALB2, PMS2, POLD1, POLE, PTEN, RAD51C, RAD51D, RECQL, SMAD4, SMARCA4, STK11, and TP53.    The test report has been scanned into EPIC and is located under the  Molecular Pathology section of the Results Review tab.  A portion of the result report is included below for reference. Genetic testing reported out on 07/13/2022.       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  At this time, it is unknown if this variant is associated with an increased risk for cancer or if it is benign, but most uncertain variants are reclassified to benign. It should not be used to make medical management decisions. With time, we suspect the laboratory will determine the significance of this variant, if any. If the laboratory reclassifies this variant, we will attempt to contact Luis Bonilla to discuss it further.   Even though a pathogenic variant was not identified, possible explanations for his personal history of colon polyps may include: There may be no hereditary risk for polyposis in the family. Luis Bonilla personal history of colon polyps may be due to other genetic or environmental factors. There may be a gene mutation in one of these genes that current testing methods cannot detect, but that chance is small. There could be another gene that has not yet been discovered, or that we have not yet tested, that is responsible for the polyposis/cancer diagnoses in the family.   Therefore, it is important to remain in touch with cancer genetics  in the future so that we can continue to offer Luis Bonilla the most up to date genetic testing.   ADDITIONAL GENETIC TESTING:  We discussed with Luis Bonilla that his genetic testing was fairly extensive.  If there are genes identified to increase cancer risk that can be analyzed in the future, we would be happy to discuss and coordinate this testing at that time.    CANCER SCREENING RECOMMENDATIONS:  Luis Bonilla test result is considered negative (normal).  This means that we have not identified a hereditary cause for his personal history of colon polyps at this time. Most colon  polyps happen by chance and this negative test suggests that his polyps may fall into this category.    An individual's cancer risk and medical management are not determined by genetic test results alone. Overall cancer risk assessment incorporates additional factors, including personal medical history, family history, and any available genetic information that may result in a personalized plan for cancer prevention and surveillance. Therefore, it is recommended he continue to follow the cancer management and screening guidelines provided by his healthcare providers.  RECOMMENDATIONS FOR FAMILY MEMBERS:   Since he did not inherit a mutation in a cancer predisposition gene included on this panel, his daughter could not have inherited a mutation from him in one of these genes. We do not recommend familial testing for the Bonilla variant of uncertain significance (VUS).  FOLLOW-UP:  Cancer genetics is a rapidly advancing field and it is possible that new genetic tests will be appropriate for him and/or his family members in the future. We encouraged him to remain in contact with cancer genetics on an annual basis so we can update his personal and family histories and let him know of advances in cancer genetics that may benefit this family.   Our contact number was provided. Mr. Everage questions were answered to his satisfaction, and he knows he is welcome to call us at anytime with additional questions or concerns.   Lucille Passy, MS, [facility] Genetic Counselor [email or web address] (P) [phone]

## 2022-08-07 ENCOUNTER — Other Ambulatory Visit: Payer: Self-pay | Admitting: Nurse Practitioner

## 2022-08-07 DIAGNOSIS — N182 Chronic kidney disease, stage 2 (mild): Secondary | ICD-10-CM

## 2022-08-08 ENCOUNTER — Other Ambulatory Visit: Payer: Self-pay | Admitting: Nurse Practitioner

## 2022-08-08 DIAGNOSIS — E1169 Type 2 diabetes mellitus with other specified complication: Secondary | ICD-10-CM

## 2022-08-30 ENCOUNTER — Ambulatory Visit: Payer: 59 | Admitting: Orthopaedic Surgery

## 2022-08-30 ENCOUNTER — Encounter: Payer: Self-pay | Admitting: Orthopaedic Surgery

## 2022-08-30 DIAGNOSIS — M25512 Pain in left shoulder: Secondary | ICD-10-CM | POA: Diagnosis not present

## 2022-08-30 DIAGNOSIS — G8929 Other chronic pain: Secondary | ICD-10-CM | POA: Diagnosis not present

## 2022-08-30 NOTE — Progress Notes (Signed)
The patient comes in today for follow-up as a relates to his left shoulder.  He is 66 years old and does ride a motorcycle quite a bit.  He has had no injury to the left shoulder but worsening pain and decreased motion with the shoulder with some weakness.  He had started home exercise program and that helped him quite a bit and when I saw him last visit a month ago we did place a steroid injection in the subacromial outlet.  He is a diabetic and he said it just bumped his blood sugars up a little bit for 1 day only.  He feels like his shoulder is done much better since the injection and home exercise program.  On exam he still does use more of his deltoid to abduct his shoulder on the left side and you can tell is bothering him but he says is really nothing much to it at all for him and he feels like he is significantly improved.  There is some slight weakness in the cup as well.  His x-rays showed a normal-appearing shoulder.  At this point follow-up can be as needed since he has made such significant improvement he will continue home exercise program.  However, if things worsen he knows to let us know and we would not need a referral from his primary care physician to see him again for this left shoulder.  All questions and concerns were answered and addressed.

## 2022-09-06 ENCOUNTER — Other Ambulatory Visit: Payer: Self-pay | Admitting: Nurse Practitioner

## 2022-09-06 ENCOUNTER — Encounter: Payer: Self-pay | Admitting: Nurse Practitioner

## 2022-09-06 DIAGNOSIS — E1169 Type 2 diabetes mellitus with other specified complication: Secondary | ICD-10-CM

## 2022-09-06 MED ORDER — ROSUVASTATIN CALCIUM 5 MG PO TABS: 5.0000 mg | ORAL_TABLET | Freq: Every day | ORAL | 2 refills | Status: DC

## 2022-09-13 ENCOUNTER — Other Ambulatory Visit: Payer: Self-pay | Admitting: Nurse Practitioner

## 2022-10-03 IMAGING — CR DG HAND COMPLETE 3+V*R*
3 series · 3 of 3 positions shown · non-contrast
Comparison: None

CLINICAL DATA: RIGHT hand pain and limited mobility of the third
metacarpophalangeal joint. No known injury. Swelling.

EXAM:
RIGHT HAND - COMPLETE 3+ VIEW

[x hand pa right]
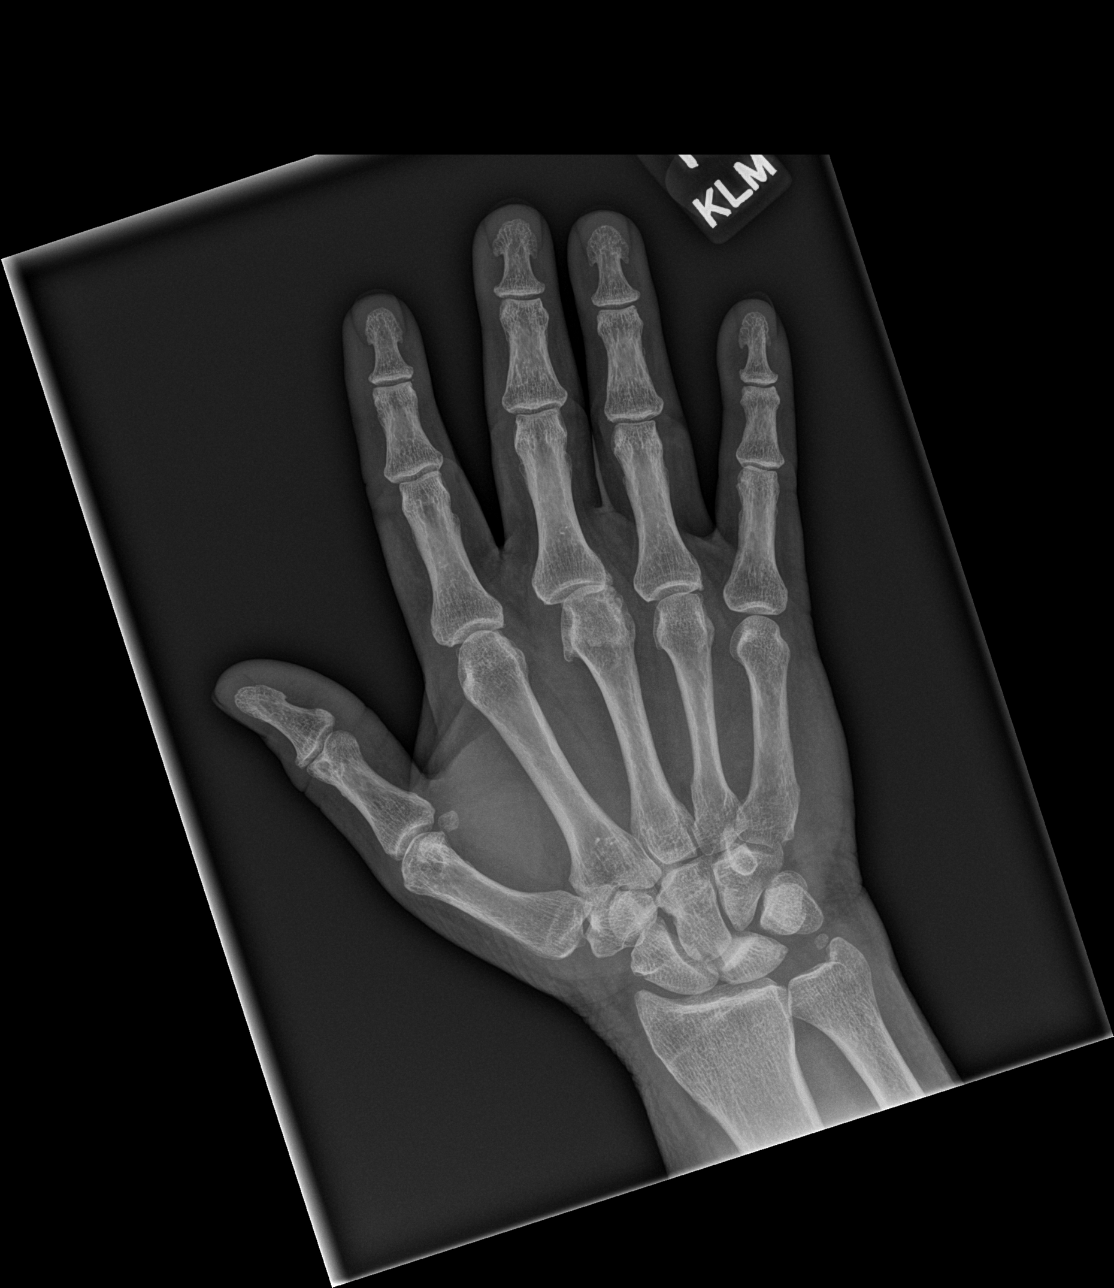

[x hand obl right]
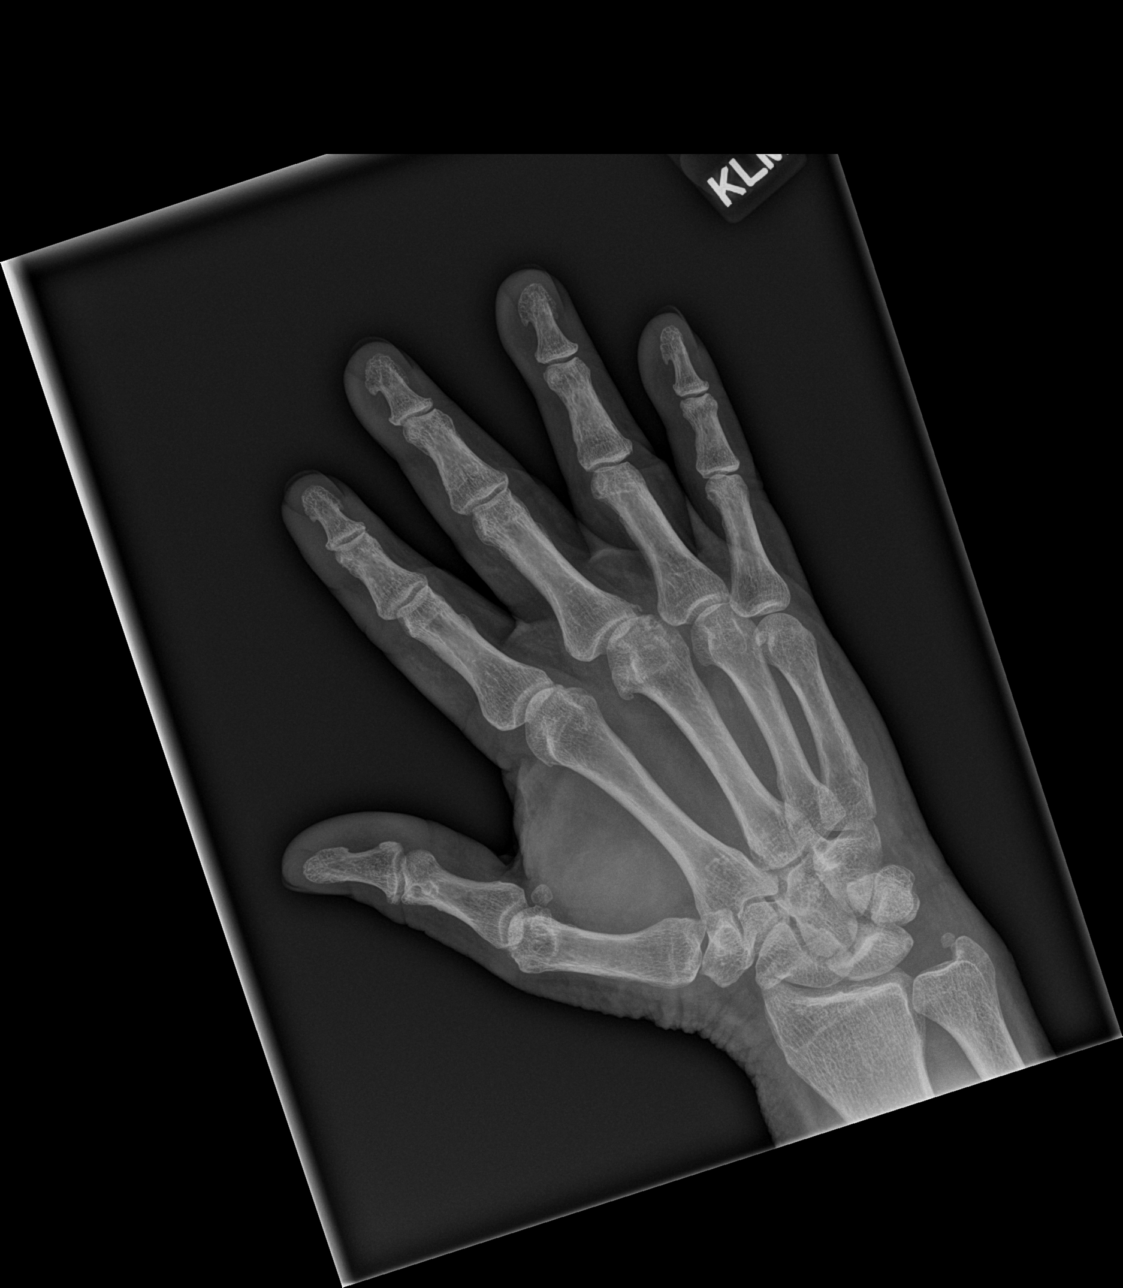

[x hand lat right]
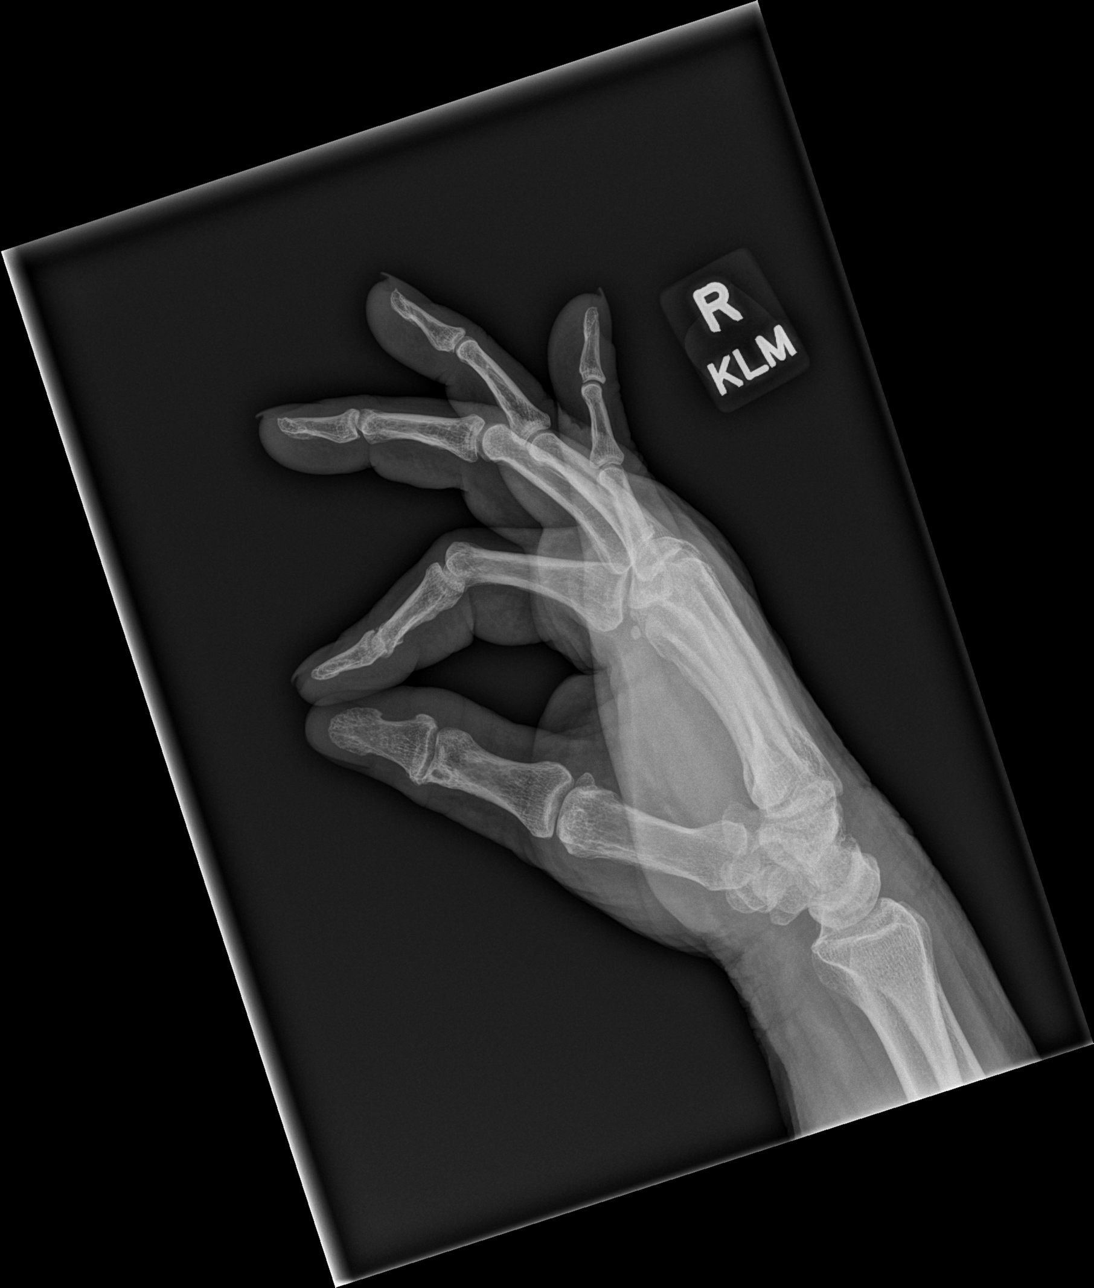

[3 of 3 positions shown; findings below may reference images not displayed]

FINDINGS: There is soft tissue swelling of the third metacarpophalangeal
joint. Degenerative changes are seen in the joint, with osteophyte
at the distal third metacarpal. Small erosion is identified along
the ulnar aspect of the base of the third proximal phalanx. There is
mild subluxation at the third metacarpophalangeal joint, without
dislocation. Bone mineral density is normal.
IMPRESSION: Small erosion, degenerative changes, and mild subluxation of the
third metacarpophalangeal joint.

Associated soft tissue swelling.

## 2022-10-24 ENCOUNTER — Other Ambulatory Visit: Payer: Self-pay

## 2022-10-24 DIAGNOSIS — I10 Essential (primary) hypertension: Secondary | ICD-10-CM

## 2022-10-24 DIAGNOSIS — E1169 Type 2 diabetes mellitus with other specified complication: Secondary | ICD-10-CM

## 2022-10-24 MED ORDER — LOSARTAN POTASSIUM 50 MG PO TABS: ORAL_TABLET | ORAL | 3 refills | Status: DC

## 2022-10-24 MED ORDER — ROSUVASTATIN CALCIUM 5 MG PO TABS: 5.0000 mg | ORAL_TABLET | Freq: Every day | ORAL | 2 refills | Status: DC

## 2022-10-24 NOTE — Progress Notes (Unsigned)
FOLLOW UP 3 MONTH  Assessment and Plan:  Essential hypertension - continue medications, DASH diet, exercise and monitor at home. Call if greater than 130/80.  -     CBC with Differential/Platelet -     CMP/GFR  Chronic obstructive pulmonary disease, unspecified COPD type ([place]) Advised to stop smoking, continue meds/Breo. May add mucinex if needed.  Patient has 40+ pack year smoking hx. Discussed progressive nature with continued smoking.   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 - discussed LDL goal <70 - Currently taking Rosuvastatin 5 mg and zetia 10 mg  -    Lipid panel   Medication management -    Continued  Vitamin D deficiency Continue Vit D supplementation to maintain value in therapeutic level of 60-100 - Vitamin D   Type 2 Diabetes Mellitus with Obesity ([place]) - follow up 4 months for progress monitoring - increase veggies, decrease carbs - long discussion about weight loss, diet, and exercise  Flu vaccine need - Flu Quad 6 MOS + PF IM given   Discussed med's effects and SE's. Screening labs and tests as requested with regular follow-up as recommended. Future Appointments  Date Time Provider [place]  12/05/2022 12:00 PM GI[facility] CT 1 [facility] [address] [facility]  02/06/2023  2:00 PM Alycia Rossetti, NP [facility] None    HPI 67 y.o. male  presents for a three motnh follow up on  HTN, HLD, T2DM, morbid obesity, smoking, vitamin D def.   He is a heavy smoker, 1-2 packs for many years with 40+ pack year hisotry. He has asthma/COPD, is on breo, did not do well with anoro (also reports did try trellegy ellipta, breztri, didn't like). Continues to use Albuterol PRN.  Has morning productive cough of white mucus from smoking and mouth dryness from CPAP.   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 Last lung imaging was CXR in 2009, .Smoking 2 packs per day. Has follow up CT scheduled for 11/2022  BMI is Body mass index is 30.72 kg/m. s. Due to his obesity/low testosterone he has ED and uses cialis PRN.  He is on Mounjaro 7.5 mg daily. He is down 45 pounds in the past 5 months Wt Readings from Last 3 Encounters:  10/25/22 205 lb (93 kg)  07/25/22 227 lb 12.8 oz (103.3 kg)  05/30/22 250 lb (113.4 kg)    His blood pressure has been controlled at home, today their BP is BP: 100/68 BP Readings from Last 3 Encounters:  10/25/22 100/68  07/25/22 126/78  05/30/22 110/72    He does not workout. He denies chest pain, shortness of breath, dizziness.   He is on cholesterol medication, ls on Crestor 5 mg QD   His cholesterol is not at goal, less than 70. The cholesterol last  visit was:   Lab Results  Component Value Date   CHOL 97 05/30/2022   HDL 30 (L) 05/30/2022   LDLCALC 48 05/30/2022   TRIG 111 05/30/2022   CHOLHDL 3.2 05/30/2022   He has not been working on diet and exercise for diabetes. 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  With hyperlipidemia not at LDL goal, is on statin denies foot ulcerations, hypoglycemia , paresthesia of the feet, polydipsia, polyuria and visual disturbances.  He is on ACE and on bASA  On metformin two pills with supper a day (had diarrhea with 4 tabs/day even spread) He is on Mounjaro 7.5 mg daily, his blood sugars are running low 90-100 Has not been checking  blood sugars, needs strips Tried invokana but could not tolerate it due to yeast. Last A1C in the office was:  Lab Results  Component Value Date   HGBA1C 5.5 07/25/2022   He is not drinking as much water. CKD II  Lab Results  Component Value Date   GFRNONAA 92 04/20/2021   Patient is on Vitamin D supplement, was on 50000 IU weekly but did run out after last OV.  Lab Results  Component Value Date   VD25OH 37 01/31/2022    Current Medications:   Current Outpatient Medications (Endocrine & Metabolic):    metFORMIN (GLUCOPHAGE-XR) 500 MG 24 hr tablet, TAKE 2 TABLETS 2 X DAY WITH MEALS FOR DIABETES   tirzepatide (MOUNJARO) 7.5 MG/0.5ML Pen, Inject 7.5 mg into the skin once a week.   glimepiride (AMARYL) 4 MG tablet, TAKE 1/2 TO 1 TABLET 2 X /DAY WITH MEALS FOR DIABETES  Current Outpatient Medications (Cardiovascular):    losartan (COZAAR) 50 MG tablet, TAKE 1 TABLET BY MOUTH EVERY DAY FOR BLOOD PRESSURE   rosuvastatin (CRESTOR) 5 MG tablet, Take 1 tablet (5 mg total) by mouth daily.   tadalafil (CIALIS) 20 MG tablet, TAKE 1/2 TO 1 TABLET EVERY 2 TO 3 DAYS IF NEEDED  Current Outpatient Medications (Respiratory):    albuterol (PROAIR HFA) 108 (90 Base) MCG/ACT inhaler, Use  2 inhalations  15 minutes  apart every 4 hours  as needed to Rescue   BREO ELLIPTA 100-25 MCG/ACT AEPB, USE 1 INHALATION BY MOUTH DAILY    Current Outpatient Medications (Other):    glucose blood test strip, Check blood sugars fasting daily   Vitamin D, Ergocalciferol, (DRISDOL) 1.25 MG (50000 UNIT) CAPS capsule, 1 capsule once a week for Vitamin D Deficiency  Medical History:  Past Medical History:  Diagnosis Date   Arthritis    LEFT shoulder/RIGHT hand   Asthma    uses inhaler   COPD (chronic obstructive pulmonary disease) (HCC)    stage 1   Erectile dysfunction    Hyperlipidemia    on meds   Hypertension    on meds   Other testicular hypofunction    Sleep apnea    uses CPAP nightly   Type II or  unspecified type diabetes mellitus without mention of complication, not stated as uncontrolled    on meds   Allergies Allergies  Allergen Reactions   Flaxseed (Linseed)     Other reaction(s): Other Bad after taste.   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   Review of Systems  Constitutional:  Negative for malaise/fatigue and weight loss.  HENT:  Negative for hearing loss  and tinnitus.   Eyes:  Negative for blurred vision and double vision.  Respiratory:  Negative for cough, sputum production, shortness of breath and wheezing.   Cardiovascular:  Negative for chest pain, palpitations, orthopnea, claudication and leg swelling.  Gastrointestinal:  Negative for abdominal pain, blood in stool, constipation, diarrhea, heartburn, melena, nausea and vomiting.  Genitourinary: Negative.  Negative for dysuria.  Musculoskeletal:  Negative for myalgias.  Skin:  Negative for rash.  Neurological:  Negative for dizziness, tingling, sensory change, weakness and headaches.  Endo/Heme/Allergies:  Negative for polydipsia.  Psychiatric/Behavioral: Negative.  Negative for depression.   All other systems reviewed and are negative.    Physical Exam: Estimated body mass index is 30.72 kg/m as calculated from the following:   Height as of this encounter: 5' 8.5" (1.74 m).   Weight as of this encounter: 205 lb (93 kg). Vitals:   10/25/22 1444  BP: 100/68  Pulse: 65  Temp: 97.7 F (36.5 C)  SpO2: 97%     General Appearance: Obese male , in no apparent distress. Eyes: PERRLA, EOMs, conjunctiva no swelling or erythema, normal fundi and vessels. Sinuses: No Frontal/maxillary tenderness ENT/Mouth: Ext aud canals clear, normal light reflex with TMs without erythema, bulging. Good dentition. No erythema, swelling, or exudate on post pharynx. Tonsils not  swollen or erythematous. Hearing normal.  Neck: Supple, thyroid normal. No bruits Respiratory: Respiratory effort normal, BS equal bilaterally with mild expiratory wheeze clears with cough, without rales, rhonchi, or stridor. Cardio: RRR without murmurs, rubs or gallops. Brisk peripheral pulses without edema.  Abdomen: Soft, obese +BS. Non tender, no guarding, rebound, hernias, masses, or organomegaly. .  Lymphatics: Non tender without lymphadenopathy.  Musculoskeletal: Symmetrical ROM  and strength Skin: Warm, dry . NO lesions Neuro: Cranial nerves intact, reflexes equal bilaterally. Normal muscle tone, no cerebellar symptoms. Sensation intact.  Psych: Awake and oriented X 3, normal affect, Insight and Judgment appropriate.   Alycia Rossetti, NP 2:56 PM [facility]

## 2022-10-25 ENCOUNTER — Ambulatory Visit: Payer: 59 | Admitting: Nurse Practitioner

## 2022-10-25 ENCOUNTER — Encounter: Payer: Self-pay | Admitting: Nurse Practitioner

## 2022-10-25 VITALS — BP 100/68 | HR 65 | Temp 97.7°F | Ht 68.5 in | Wt 205.0 lb

## 2022-10-25 DIAGNOSIS — J449 Chronic obstructive pulmonary disease, unspecified: Secondary | ICD-10-CM | POA: Diagnosis not present

## 2022-10-25 DIAGNOSIS — Z79899 Other long term (current) drug therapy: Secondary | ICD-10-CM

## 2022-10-25 DIAGNOSIS — I10 Essential (primary) hypertension: Secondary | ICD-10-CM | POA: Diagnosis not present

## 2022-10-25 DIAGNOSIS — E1169 Type 2 diabetes mellitus with other specified complication: Secondary | ICD-10-CM | POA: Diagnosis not present

## 2022-10-25 DIAGNOSIS — E1122 Type 2 diabetes mellitus with diabetic chronic kidney disease: Secondary | ICD-10-CM | POA: Diagnosis not present

## 2022-10-25 DIAGNOSIS — Z23 Encounter for immunization: Secondary | ICD-10-CM

## 2022-10-25 DIAGNOSIS — F17210 Nicotine dependence, cigarettes, uncomplicated: Secondary | ICD-10-CM

## 2022-10-25 DIAGNOSIS — E559 Vitamin D deficiency, unspecified: Secondary | ICD-10-CM

## 2022-10-25 MED ORDER — MOUNJARO 7.5 MG/0.5ML ~~LOC~~ SOAJ: 7.5000 mg | SUBCUTANEOUS | 1 refills | Status: DC

## 2022-10-25 NOTE — Patient Instructions (Signed)

## 2022-10-26 LAB — CBC WITH DIFFERENTIAL/PLATELET
Absolute Monocytes: 515 cells/uL (ref 200–950)
Basophils Absolute: 31 cells/uL (ref 0–200)
Basophils Relative: 0.4 %
Eosinophils Absolute: 101 cells/uL (ref 15–500)
Eosinophils Relative: 1.3 %
HCT: 39.1 % (ref 38.5–50.0)
Hemoglobin: 13.4 g/dL (ref 13.2–17.1)
Lymphs Abs: 2371 cells/uL (ref 850–3900)
MCH: 29.9 pg (ref 27.0–33.0)
MCHC: 34.3 g/dL (ref 32.0–36.0)
MCV: 87.3 fL (ref 80.0–100.0)
MPV: 9.6 fL (ref 7.5–12.5)
Monocytes Relative: 6.6 %
Neutro Abs: 4781 cells/uL (ref 1500–7800)
Neutrophils Relative %: 61.3 %
Platelets: 185 10*3/uL (ref 140–400)
RBC: 4.48 10*6/uL (ref 4.20–5.80)
RDW: 12.9 % (ref 11.0–15.0)
Total Lymphocyte: 30.4 %
WBC: 7.8 10*3/uL (ref 3.8–10.8)

## 2022-10-26 LAB — COMPLETE METABOLIC PANEL WITH GFR
AG Ratio: 1.6 (calc) (ref 1.0–2.5)
ALT: 7 U/L — ABNORMAL LOW (ref 9–46)
AST: 9 U/L — ABNORMAL LOW (ref 10–35)
Albumin: 4.2 g/dL (ref 3.6–5.1)
Alkaline phosphatase (APISO): 62 U/L (ref 35–144)
BUN: 13 mg/dL (ref 7–25)
CO2: 27 mmol/L (ref 20–32)
Calcium: 9.6 mg/dL (ref 8.6–10.3)
Chloride: 103 mmol/L (ref 98–110)
Creat: 1.01 mg/dL (ref 0.70–1.35)
Globulin: 2.6 g/dL (calc) (ref 1.9–3.7)
Glucose, Bld: 92 mg/dL (ref 65–99)
Potassium: 4.8 mmol/L (ref 3.5–5.3)
Sodium: 140 mmol/L (ref 135–146)
Total Bilirubin: 0.5 mg/dL (ref 0.2–1.2)
Total Protein: 6.8 g/dL (ref 6.1–8.1)
eGFR: 83 mL/min/{1.73_m2} (ref >=60)

## 2022-10-26 LAB — VITAMIN D 25 HYDROXY (VIT D DEFICIENCY, FRACTURES): Vit D, 25-Hydroxy: 71 ng/mL (ref 30–100)

## 2022-10-26 LAB — LIPID PANEL
Cholesterol: 98 mg/dL (ref <200)
HDL: 34 mg/dL — ABNORMAL LOW (ref >=40)
LDL Cholesterol (Calc): 50 mg/dL (calc)
Non-HDL Cholesterol (Calc): 64 mg/dL (calc) (ref <130)
Total CHOL/HDL Ratio: 2.9 (calc) (ref <5.0)
Triglycerides: 68 mg/dL (ref <150)

## 2022-10-26 LAB — HEMOGLOBIN A1C
Hgb A1c MFr Bld: 5.9 % of total Hgb — ABNORMAL HIGH (ref <5.7)
Mean Plasma Glucose: 123 mg/dL
eAG (mmol/L): 6.8 mmol/L

## 2022-11-16 ENCOUNTER — Encounter: Payer: Self-pay | Admitting: Gastroenterology

## 2022-11-28 ENCOUNTER — Encounter: Payer: Self-pay | Admitting: Gastroenterology

## 2022-12-05 ENCOUNTER — Ambulatory Visit
Admission: RE | Admit: 2022-12-05 | Discharge: 2022-12-05 | Disposition: A | Discharge status: Home / Self Care | Payer: 59 | Source: Ambulatory Visit | Attending: Nurse Practitioner | Admitting: Nurse Practitioner

## 2022-12-05 DIAGNOSIS — R911 Solitary pulmonary nodule: Secondary | ICD-10-CM

## 2022-12-19 ENCOUNTER — Ambulatory Visit (AMBULATORY_SURGERY_CENTER): Payer: 59 | Admitting: *Deleted

## 2022-12-19 VITALS — Ht 69.5 in | Wt 185.0 lb

## 2022-12-19 DIAGNOSIS — Z86010 Personal history of colonic polyps: Secondary | ICD-10-CM

## 2022-12-19 MED ORDER — NA SULFATE-K SULFATE-MG SULF 17.5-3.13-1.6 GM/177ML PO SOLN: 1.0000 | Freq: Once | ORAL | 0 refills | Status: AC

## 2022-12-19 NOTE — Progress Notes (Signed)
No egg or soy allergy known to patient  No issues known to pt with past sedation with any surgeries or procedures Patient denies ever being intubated No issues with moving moving head or neck or swallowing  No FH of Malignant Hyperthermia Pt is not on diet pills Pt is not on  home 02  Pt is not on blood thinners  Pt denies issues with constipation  Pt is not on dialysis Pt denies any upcoming cardiac testing Pt encouraged to use to use Singlecare or Goodrx to reduce cost  Patient's chart reviewed by Osvaldo Angst CNRA prior to previsit and patient appropriate for the [place].  Previsit completed and red dot placed by patient's name on their procedure day (on provider's schedule).  . Visit by mouth Instructions reviewed with pt and pt states understanding. Instructed to review again prior to procedure. Pt states they will. Instructions have been sent by mail with coupon and by my chart

## 2022-12-21 ENCOUNTER — Encounter: Payer: Self-pay | Admitting: Gastroenterology

## 2023-01-09 ENCOUNTER — Encounter: Payer: Self-pay | Admitting: Gastroenterology

## 2023-01-09 ENCOUNTER — Ambulatory Visit (AMBULATORY_SURGERY_CENTER): Payer: 59 | Admitting: Gastroenterology

## 2023-01-09 VITALS — BP 100/43 | HR 58 | Temp 97.5°F | Resp 26 | Ht 69.5 in | Wt 185.0 lb

## 2023-01-09 DIAGNOSIS — K621 Rectal polyp: Secondary | ICD-10-CM | POA: Diagnosis not present

## 2023-01-09 DIAGNOSIS — D122 Benign neoplasm of ascending colon: Secondary | ICD-10-CM

## 2023-01-09 DIAGNOSIS — D123 Benign neoplasm of transverse colon: Secondary | ICD-10-CM | POA: Diagnosis not present

## 2023-01-09 DIAGNOSIS — D125 Benign neoplasm of sigmoid colon: Secondary | ICD-10-CM

## 2023-01-09 DIAGNOSIS — D128 Benign neoplasm of rectum: Secondary | ICD-10-CM

## 2023-01-09 DIAGNOSIS — K6389 Other specified diseases of intestine: Secondary | ICD-10-CM

## 2023-01-09 DIAGNOSIS — D124 Benign neoplasm of descending colon: Secondary | ICD-10-CM

## 2023-01-09 DIAGNOSIS — D120 Benign neoplasm of cecum: Secondary | ICD-10-CM

## 2023-01-09 DIAGNOSIS — Z86010 Personal history of colonic polyps: Secondary | ICD-10-CM

## 2023-01-09 DIAGNOSIS — K635 Polyp of colon: Secondary | ICD-10-CM | POA: Diagnosis not present

## 2023-01-09 DIAGNOSIS — Z09 Encounter for follow-up examination after completed treatment for conditions other than malignant neoplasm: Secondary | ICD-10-CM

## 2023-01-09 DIAGNOSIS — K641 Second degree hemorrhoids: Secondary | ICD-10-CM

## 2023-01-09 MED ORDER — SODIUM CHLORIDE 0.9 % IV SOLN: 500.0000 mL | Freq: Once | INTRAVENOUS | Status: DC

## 2023-01-09 NOTE — Progress Notes (Signed)
Vss nad trans to pacu 

## 2023-01-09 NOTE — Progress Notes (Signed)
Pt's states no medical or surgical changes since previsit or office visit. 

## 2023-01-09 NOTE — Patient Instructions (Signed)
Handouts provided about hemorrhoids and polyps.  Resume previous diet.  Continue present medications.  Await pathology results.  Repeat colonoscopy in 2 years for surveillance, or sooner based on pathology results.    YOU HAD AN ENDOSCOPIC PROCEDURE TODAY AT [facility]:   Refer to the procedure report that was given to you for any specific questions about what was found during the examination.  If the procedure report does not answer your questions, please call your gastroenterologist to clarify.  If you requested that your care partner not be given the details of your procedure findings, then the procedure report has been included in a sealed envelope for you to review at your convenience later.  YOU SHOULD EXPECT: Some feelings of bloating in the abdomen. Passage of more gas than usual.  Walking can help get rid of the air that was put into your GI tract during the procedure and reduce the bloating. If you had a lower endoscopy (such as a colonoscopy or flexible sigmoidoscopy) you may notice spotting of blood in your stool or on the toilet paper. If you underwent a bowel prep for your procedure, you may not have a normal bowel movement for a few days.  Please Note:  You might notice some irritation and congestion in your nose or some drainage.  This is from the oxygen used during your procedure.  There is no need for concern and it should clear up in a day or so.  SYMPTOMS TO REPORT IMMEDIATELY:  Following lower endoscopy (colonoscopy or flexible sigmoidoscopy):  Excessive amounts of blood in the stool  Significant tenderness or worsening of abdominal pains  Swelling of the abdomen that is new, acute  Fever of 100F or higher  For urgent or emergent issues, a gastroenterologist can be reached at any hour by calling [phone]. Do not use MyChart messaging for urgent concerns.    DIET:  We do recommend a small meal at first, but then you may proceed to your regular diet.   Drink plenty of fluids but you should avoid alcoholic beverages for 24 hours.  ACTIVITY:  You should plan to take it easy for the rest of today and you should NOT DRIVE or use heavy machinery until tomorrow (because of the sedation medicines used during the test).    FOLLOW UP: Our staff will call the number listed on your records the next business day following your procedure.  We will call around 7:15- 8:00 am to check on you and address any questions or concerns that you may have regarding the information given to you following your procedure. If we do not reach you, we will leave a message.     If any biopsies were taken you will be contacted by phone or by letter within the next 1-3 weeks.  Please call us at [phone] if you have not heard about the biopsies in 3 weeks.    SIGNATURES/CONFIDENTIALITY: You and/or your care partner have signed paperwork which will be entered into your electronic medical record.  These signatures attest to the fact that that the information above on your After Visit Summary has been reviewed and is understood.  Full responsibility of the confidentiality of this discharge information lies with you and/or your care-partner.

## 2023-01-09 NOTE — Progress Notes (Signed)
GASTROENTEROLOGY PROCEDURE H&P NOTE   Primary Care Physician: Unk Pinto, MD    Reason for Procedure:  Colon polyp surveillance  Plan:    Colonoscopy  Patient is appropriate for endoscopic procedure(s) in the ambulatory ([facility]) setting.  The nature of the procedure, as well as the risks, benefits, and alternatives were carefully and thoroughly reviewed with the patient. Ample time for discussion and questions allowed. The patient understood, was satisfied, and agreed to proceed.     HPI: Luis Bonilla is a 67 y.o. male who presents for short interval repeat colonoscopy for ongoing colon polyp surveillance.  Last colonoscopy was 04/11/2022 (index colonoscopy for positive Cologuard) and notable for 17 polyps scattered throughout the cecum through transverse colon ranging 3-20 mm in size (path: Tubular adenomas and at least 1 TVA), 8 mm polyp at the ileocecal valve (path: Tubular adenoma), And 4 polyps in the left colon ranging 4-12 mm (path: Tubular adenomas).  Normal ileum and recommended repeat colonoscopy in 6 months along with referral to the [facility].  He was seen in the [facility] and negative/normal genetic testing in 06/2022.  Past Medical History:  Diagnosis Date   Arthritis    LEFT shoulder/RIGHT hand   Asthma    uses inhaler   COPD (chronic obstructive pulmonary disease) ([place])    stage 1   Erectile dysfunction    Hyperlipidemia    on meds   Hypertension    on meds   Other testicular hypofunction    Sleep apnea    uses CPAP nightly   Type II or unspecified type diabetes mellitus without mention of complication, not stated as uncontrolled    on meds    Past Surgical History:  Procedure Laterality Date   DENTAL SURGERY     VASECTOMY      Prior to Admission medications   Medication Sig Start Date End Date Taking? Authorizing Provider  albuterol (PROAIR HFA) 108 (90 Base) MCG/ACT inhaler Use  2 inhalations  15 minutes  apart every 4 hours  as  needed to Rescue 05/09/22  Yes Alycia Rossetti, NP  BREO ELLIPTA 100-25 MCG/ACT AEPB USE 1 INHALATION BY MOUTH DAILY 09/18/22  Yes Alycia Rossetti, NP  glucose blood test strip Check blood sugars fasting daily 05/30/22  Yes Alycia Rossetti, NP  losartan (COZAAR) 50 MG tablet TAKE 1 TABLET BY MOUTH EVERY DAY FOR BLOOD PRESSURE 10/24/22  Yes Alycia Rossetti, NP  metFORMIN (GLUCOPHAGE-XR) 500 MG 24 hr tablet TAKE 2 TABLETS 2 X DAY WITH MEALS FOR DIABETES 02/10/22  Yes Alycia Rossetti, NP  rosuvastatin (CRESTOR) 5 MG tablet Take 1 tablet (5 mg total) by mouth daily. 10/24/22 10/24/23 Yes Alycia Rossetti, NP  tadalafil (CIALIS) 20 MG tablet TAKE 1/2 TO 1 TABLET EVERY 2 TO 3 DAYS IF NEEDED 08/08/22  Yes Alycia Rossetti, NP  Vitamin D, Ergocalciferol, (DRISDOL) 1.25 MG (50000 UNIT) CAPS capsule 1 capsule once a week for Vitamin D Deficiency 04/20/21  Yes Liane Comber, NP  tirzepatide [facility]) 7.5 MG/0.5ML Pen Inject 7.5 mg into the skin once a week. Patient taking differently: Inject into the skin once a week. 10/25/22   Alycia Rossetti, NP    Current Outpatient Medications  Medication Sig Dispense Refill   albuterol (PROAIR HFA) 108 (90 Base) MCG/ACT inhaler Use  2 inhalations  15 minutes  apart every 4 hours  as needed to Rescue 48 g 3   BREO ELLIPTA 100-25 MCG/ACT AEPB USE  1 INHALATION BY MOUTH DAILY 180 each 3   glucose blood test strip Check blood sugars fasting daily 100 each 12   losartan (COZAAR) 50 MG tablet TAKE 1 TABLET BY MOUTH EVERY DAY FOR BLOOD PRESSURE 90 tablet 3   metFORMIN (GLUCOPHAGE-XR) 500 MG 24 hr tablet TAKE 2 TABLETS 2 X DAY WITH MEALS FOR DIABETES 360 tablet 2   rosuvastatin (CRESTOR) 5 MG tablet Take 1 tablet (5 mg total) by mouth daily. 90 tablet 2   tadalafil (CIALIS) 20 MG tablet TAKE 1/2 TO 1 TABLET EVERY 2 TO 3 DAYS IF NEEDED 6 tablet 14   Vitamin D, Ergocalciferol, (DRISDOL) 1.25 MG (50000 UNIT) CAPS capsule 1 capsule once a week for Vitamin D Deficiency 13  capsule 3   tirzepatide (MOUNJARO) 7.5 MG/0.5ML Pen Inject 7.5 mg into the skin once a week. (Patient taking differently: Inject into the skin once a week.) 6 mL 1   Current Facility-Administered Medications  Medication Dose Route Frequency Provider Last Rate Last Admin   0.9 %  sodium chloride infusion  500 mL Intravenous Once Dwayna Kentner V, DO        Allergies as of 01/09/2023 - Review Complete 01/09/2023  Allergen Reaction Noted   Flaxseed (linseed)  12/28/2013   Flax [flax seed oil]  12/28/2013    Family History  Problem Relation Age of Onset   Stroke Mother    Hypertension Mother    Diabetes Mother    Lung cancer Father 9       smoked   Throat cancer Maternal Grandmother 58       smoked   Colon polyps Neg Hx    Colon cancer Neg Hx    Esophageal cancer Neg Hx    Stomach cancer Neg Hx    Rectal cancer Neg Hx     Social History   Socioeconomic History   Marital status: Married    Spouse name: Not on file   Number of children: Not on file   Years of education: Not on file   Highest education level: Not on file  Occupational History   Not on file  Tobacco Use   Smoking status: Every Day    Packs/day: 2.00    Years: 21.00    Additional pack years: 0.00    Total pack years: 42.00    Types: Cigarettes    Start date: 10/24/1999   Smokeless tobacco: Never  Vaping Use   Vaping Use: Never used  Substance and Sexual Activity   Alcohol use: No   Drug use: No   Sexual activity: Not on file  Other Topics Concern   Not on file  Social History Narrative   Not on file   Social Determinants of Health   Financial Resource Strain: Not on file  Food Insecurity: Not on file  Transportation Needs: Not on file  Physical Activity: Not on file  Stress: Not on file  Social Connections: Not on file  Intimate Partner Violence: Not on file    Physical Exam: Vital signs in last 24 hours: @BP  (!) 107/53   Pulse 60   Temp (!) 97.5 F (36.4 C) (Temporal)   Ht 5' 9.5"  (1.765 m)   Wt 185 lb (83.9 kg)   SpO2 100%   BMI 26.93 kg/m  GEN: NAD EYE: Sclerae anicteric ENT: MMM CV: Non-tachycardic Pulm: CTA b/l GI: Soft, NT/ND NEURO:  Alert & Oriented x 3   Gerrit Heck, DO [facility]   01/09/2023 1:33 PM

## 2023-01-09 NOTE — Op Note (Signed)
[facility] Patient Name: Luis Bonilla Procedure Date: 01/09/2023 1:31 PM MRN: [id number] Endoscopist: Gerrit Heck , MD, [id number] Age: 67 Referring MD:  Date of Birth: 1956-09-21 Gender: Male Account #: [id number] Procedure:                Colonoscopy Indications:              Surveillance: History of numerous (> 10) adenomas                            on last colonoscopy (< 3 yrs)                           Last colonoscopy was 04/11/2022 (index colonoscopy                            for positive Cologuard) and notable for 17 polyps                            scattered throughout the cecum through transverse                            colon ranging 3-20 mm in size (path: Tubular                            adenomas and at least 1 TVA), 8 mm polyp at the                            ileocecal valve (path: Tubular adenoma), And 4                            polyps in the left colon ranging 4-12 mm (path:                            Tubular adenomas). Normal ileum. Recommended short                            interval repeat colonoscopy along with referral to                            the [facility].                           e was seen in the [facility] and                            negative/normal genetic testing in 06/2022. Medicines:                Monitored Anesthesia Care Procedure:                Pre-Anesthesia Assessment:                           - Prior to the procedure, a History and Physical  was performed, and patient medications and                            allergies were reviewed. The patient's tolerance of                            previous anesthesia was also reviewed. The risks                            and benefits of the procedure and the sedation                            options and risks were discussed with the patient.                            All questions were answered, and informed consent                             was obtained. Prior Anticoagulants: The patient has                            taken no anticoagulant or antiplatelet agents. ASA                            Grade Assessment: II - A patient with mild systemic                            disease. After reviewing the risks and benefits,                            the patient was deemed in satisfactory condition to                            undergo the procedure.                           After obtaining informed consent, the colonoscope                            was passed under direct vision. Throughout the                            procedure, the patient's blood pressure, pulse, and                            oxygen saturations were monitored continuously. The                            CF [id number] was introduced through the anus                            and advanced to the the cecum, identified by  appendiceal orifice and ileocecal valve. The                            colonoscopy was performed without difficulty. The                            patient tolerated the procedure well. The quality                            of the bowel preparation was good. The ileocecal                            valve, appendiceal orifice, and rectum were                            photographed. Scope In: 1:40:42 PM Scope Out: 2:11:18 PM Scope Withdrawal Time: 0 hours 27 minutes 18 seconds  Total Procedure Duration: 0 hours 30 minutes 36 seconds  Findings:                 The perianal and digital rectal examinations were                            normal.                           A 3 mm polyp was found in the appendiceal orifice.                            The polyp was sessile. The polyp was removed with a                            cold biopsy forceps. Resection and retrieval were                            complete. Estimated blood loss was minimal.                           Seven sessile polyps were found  in the transverse                            colon (5) and ascending colon (2). The polyps were                            3 to 6 mm in size. These polyps were removed with a                            cold snare. Resection and retrieval were complete.                            Estimated blood loss was minimal.                           Three sessile polyps were found in the sigmoid  colon (1) and descending colon (2). The polyps were                            3 to 6 mm in size. These polyps were removed with a                            cold snare. Resection and retrieval were complete.                            Estimated blood loss was minimal.                           Three sessile polyps were found in the rectum. The                            polyps were 2 to 3 mm in size. These polyps were                            removed with a cold snare. Resection and retrieval                            were complete. Estimated blood loss was minimal.                           Non-bleeding internal hemorrhoids were found during                            retroflexion. The hemorrhoids were small and Grade                            II (internal hemorrhoids that prolapse but reduce                            spontaneously).                           A single small angioectasia with typical                            arborization was found in the ascending colon. Complications:            No immediate complications. Estimated Blood Loss:     Estimated blood loss was minimal. Impression:               - One 3 mm polyp at the appendiceal orifice,                            removed with a cold biopsy forceps. Resected and                            retrieved.                           - Seven 3 to 6 mm polyps in the transverse  colon                            and in the ascending colon, removed with a cold                            snare. Resected and retrieved.                            - Three 3 to 6 mm polyps in the sigmoid colon and                            in the descending colon, removed with a cold snare.                            Resected and retrieved.                           - Three 2 to 3 mm polyps in the rectum, removed                            with a cold snare. Resected and retrieved.                           - Non-bleeding internal hemorrhoids.                           - A single colonic angioectasia.                           - The GI Genius (intelligent endoscopy module),                            computer-aided polyp detection system powered by AI                            was utilized to detect colorectal polyps through                            enhanced visualization during colonoscopy. Recommendation:           - Patient has a contact number available for                            emergencies. The signs and symptoms of potential                            delayed complications were discussed with the                            patient. Return to normal activities tomorrow.                            Written discharge instructions were provided to the  patient.                           - Resume previous diet.                           - Continue present medications.                           - Await pathology results.                           - Repeat colonoscopy in 2 years for surveillance,                            or sooner based on pathology results.                           - Return to GI clinic PRN. Gerrit Heck, MD 01/09/2023 2:23:54 PM

## 2023-01-09 NOTE — Progress Notes (Signed)
Called to room to assist during endoscopic procedure.  Patient ID and intended procedure confirmed with present staff. Received instructions for my participation in the procedure from the performing physician.  

## 2023-01-10 ENCOUNTER — Telehealth: Payer: Self-pay | Admitting: *Deleted

## 2023-01-10 NOTE — Telephone Encounter (Signed)
  Follow up Call-     01/09/2023   12:47 PM 04/11/2022   10:15 AM  Call back number  Post procedure Call Back phone  # [phone]  Permission to leave phone message Yes Yes     Patient questions:  Do you have a fever, pain , or abdominal swelling? No. Pain Score  0 *  Have you tolerated food without any problems? Yes.    Have you been able to return to your normal activities? Yes.    Do you have any questions about your discharge instructions: Diet   No. Medications  No. Follow up visit  No.  Do you have questions or concerns about your Care? No.  Actions: * If pain score is 4 or above: No action needed, pain <4.

## 2023-01-15 ENCOUNTER — Encounter: Payer: Self-pay | Admitting: Gastroenterology

## 2023-01-23 ENCOUNTER — Encounter: Payer: Self-pay | Admitting: Nurse Practitioner

## 2023-01-23 ENCOUNTER — Other Ambulatory Visit: Payer: Self-pay | Admitting: Nurse Practitioner

## 2023-01-23 DIAGNOSIS — E1122 Type 2 diabetes mellitus with diabetic chronic kidney disease: Secondary | ICD-10-CM

## 2023-01-23 MED ORDER — MOUNJARO 10 MG/0.5ML ~~LOC~~ SOAJ: 10.0000 mg | SUBCUTANEOUS | 3 refills | Status: DC

## 2023-02-01 ENCOUNTER — Encounter: Payer: 59 | Admitting: Nurse Practitioner

## 2023-02-05 NOTE — Progress Notes (Unsigned)
COMPLETE PHYSICAL   Assessment and Plan:  Luis Bonilla was seen today for annual exam and medication refill.  Diagnoses and all orders for this visit:  Encounter for general adult medical examination with abnormal findings Yearly  Essential hypertension -  continue medications, DASH diet, exercise and monitor at home. Call if greater than 130/80.   -     CBC with Differential/Platelet -     COMPLETE METABOLIC PANEL WITH GFR -     TSH -     Magnesium  Chronic obstructive pulmonary disease, unspecified COPD type (HCC) Advised to stop smoking, continue meds, currently on BREO Patient has 100 pack year smoking history Agreed to do low dose CT, no symptoms  Type 2 diabetes mellitus with hyperlipidemia (HCC) Taking Amyryl half tablet BID, Metformin 500mg  two tablets BID Discussed general issues about diabetes pathophysiology and management., Educational material distributed., Suggested low cholesterol diet., Encouraged aerobic exercise., Discussed foot care., Reminded to get yearly retinal exam. Not at goal Admits to eating poorly Long discussion that with his smoking and DM he is VERY high risk MI/stroke -     Hemoglobin A1c   CKD stage 2 due to type 2 diabetes mellitus (HCC) Increase fluids  Avoid NSAIDS Blood pressure control Monitor sugars  Will continue to monitor  Mixed hyperlipidemia with Type 2 DM(HCC) Continue Crestor, diet and exercise -     Lipid panel -     ezetimibe (ZETIA) 10 MG tablet; Take 1 tablet (10 mg total) by mouth daily.  Morbid obesity (HCC) Discussed dietary and exercise modifications  Erectile dysfunction associated with type 2 diabetes mellitus (HCC) Hypogonadism in male Has PRN tadalafil  Uncomplicated asthma, unspecified asthma severity, unspecified whether persistent Continue breo, weight loss advised Patient needs proair, not ventolin  Tobacco use disorder Discussed smoking cessation Not ready to quit at this time Continue to assess  readiness  Medication management Continued  PAC Strongly encouraged to limit caffeine and cut down/quit tobacco  Vitamin D deficiency Continue supplementation to maintain goal of 70-100 Taking Vitamin D 50,000 IU daily -     VITAMIN D 25 Hydroxy (Vit-D Deficiency, Fractures)  Screening PSA (prostate specific antigen) -     PSA  Screening, ischemic heart disease -     EKG 12-Lead  Screening for thyroid disorder -     TSH  Screening for blood or protein in urine -     Routine UA with reflex microscopic -  Microalbumin/creatinine urine ratio  Pulmonary nodule Follow up CT is due 02/2023  Further disposition pending results if labs check today. Discussed med's effects and SE's.   Over 30 minutes of face to face interview, exam, counseling, chart review, and critical decision making was performed.   Discussed med's effects and SE's. Screening labs and tests as requested with regular follow-up as recommended. Future Appointments  Date Time Provider Department Center  02/06/2023  2:00 PM Luis Dick, NP [facility] None  02/06/2024  2:00 PM Luis Dick, NP [facility] None    HPI 67 y.o. male  presents for a complete physical.  His blood pressure has been controlled at home, today their BP is    BP Readings from Last 3 Encounters:  01/09/23 (!) 100/43  10/25/22 100/68  07/25/22 126/78    He does not workout. He denies chest pain, shortness of breath, dizziness.    He had CT 12/05/22 to f/u on pulmonary nodule- results were: Marland Kitchen A small solid pulmonary nodule of the left lower lobe  measuring 4.5 mm is slightly increased in size when compared with prior exam.Lung-RADS 4A, suspicious. Follow up low-dose chest CT without contrast in 3 months (please use the following order, CT CHEST LCS NODULE FOLLOW-UP W/O CM) is recommended. Alternatively, PET may be considered when there is a solid component 42mm or larger.  He has been experiencing pain in left shoulder x 4-5  months.  Unable to move left arm over head, laterally or to the back.  Will get sharp pain with certain movements or with pressure to the shoulder. Meloxicam is not helping the pain  BMI is There is no height or weight on file to calculate BMI., he is working on diet and exercise. Due to his obesity/low testosterone he has ED and uses cialis PRN.  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  Wt Readings from Last 3 Encounters:  01/09/23 185 lb (83.9 kg)  12/19/22 185 lb (83.9 kg)  10/25/22 205 lb (93 kg)   He has asthma/COPD, is on breo, did not do well with anoro  STATES HE HAD YEAST WITH THIS, and will occ use albuterol, does not want a new med at this time. Has had ventolin in the past, it is not as affective as proair. 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 2 packs a day for 50 years. Was previously scheduled for low dose CT but canceled  He is on cholesterol medication, Rosuvastatin 5 mg, was started on zetia last visit but stopped. His cholesterol is not at goal, less than 70. The cholesterol last visit was:   Lab Results  Component Value Date   CHOL 98 10/25/2022   HDL 34 (L) 10/25/2022   LDLCALC 50 10/25/2022   TRIG 68 10/25/2022   CHOLHDL 2.9 10/25/2022   He has not been working on diet and exercise for diabetes With hyperlipidemia not at goal denies foot ulcerations, hypoglycemia , paresthesia of the feet, polydipsia, polyuria and visual disturbances.  He is on ACE and on bASA and metformin two pills with supper a day and glimepiride, he is on 1/2mg  BID, has not been checking sugars. Tried invokana but could not tolerate it due to yeast. Last A1C in the office was:  Lab Results  Component Value Date   HGBA1C 5.9 (H) 10/25/2022   Lab Results  Component Value Date   GFRNONAA 92 04/20/2021   Patient is on Vitamin D supplement, on 5000 IU Lab  Results  Component Value Date   VD25OH 71 10/25/2022   Lab Results  Component Value Date   PSA 0.23 01/31/2022   PSA 0.22 01/18/2021   PSA 0.2 12/17/2019    Current Medications:  Current Outpatient Medications on File Prior to Visit  Medication Sig Dispense Refill   tirzepatide (MOUNJARO) 10 MG/0.5ML Pen Inject 10 mg into the skin once a week. 2 mL 3   albuterol (PROAIR HFA) 108 (90 Base) MCG/ACT inhaler Use  2 inhalations  15 minutes  apart every 4 hours  as needed to Rescue 48 g 3   BREO ELLIPTA 100-25 MCG/ACT AEPB USE 1 INHALATION BY MOUTH DAILY 180 each 3   glucose blood test strip Check blood sugars fasting daily 100 each 12   losartan (COZAAR) 50 MG tablet TAKE 1 TABLET BY MOUTH EVERY DAY FOR BLOOD PRESSURE 90 tablet 3   metFORMIN (  GLUCOPHAGE-XR) 500 MG 24 hr tablet TAKE 2 TABLETS 2 X DAY WITH MEALS FOR DIABETES 360 tablet 2   rosuvastatin (CRESTOR) 5 MG tablet Take 1 tablet (5 mg total) by mouth daily. 90 tablet 2   tadalafil (CIALIS) 20 MG tablet TAKE 1/2 TO 1 TABLET EVERY 2 TO 3 DAYS IF NEEDED 6 tablet 14   Vitamin D, Ergocalciferol, (DRISDOL) 1.25 MG (50000 UNIT) CAPS capsule 1 capsule once a week for Vitamin D Deficiency 13 capsule 3   No current facility-administered medications on file prior to visit.   Immunization History  Administered Date(s) Administered   Influenza Inj Mdck Quad With Preservative 09/02/2019   Influenza,inj,Quad PF,6+ Mos 10/25/2022   Influenza-Unspecified 08/15/2017   PFIZER(Purple Top)SARS-COV-2 Vaccination 01/28/2020   Tdap 01/04/2015   Health Maintenance:  Tetanus: 2016 Pneumovax: declines Prevnar 13: when 67 Flu vaccine: 2020 AT work Zostavax: declines DEXA:N/A Colonoscopy: declines-willing to do cologuard EGD: N/A  Eye Doctor: 2023 Dentist:None, discussed  Medical History:  Past Medical History:  Diagnosis Date   Arthritis    LEFT shoulder/RIGHT hand   Asthma    uses inhaler   COPD (chronic obstructive pulmonary disease)  (HCC)    stage 1   Erectile dysfunction    Hyperlipidemia    on meds   Hypertension    on meds   Other testicular hypofunction    Sleep apnea    uses CPAP nightly   Type II or unspecified type diabetes mellitus without mention of complication, not stated as uncontrolled    on meds   Allergies Allergies  Allergen Reactions   Flaxseed (Linseed)     Other reaction(s): Other Bad after taste.   Flax [Flax Seed Oil]     SURGICAL HISTORY He  has a past surgical history that includes Dental surgery and Vasectomy. FAMILY HISTORY His family history includes Diabetes in his mother; Hypertension in his mother; Lung cancer (age of onset: 24) in his father; Stroke in his mother; Throat cancer (age of onset: 80) in his maternal grandmother.  Lung cancer in dad  SOCIAL HISTORY He  reports that he has been smoking cigarettes. He started smoking about 23 years ago. He has a 42.00 pack-year smoking history. He has never used smokeless tobacco. He reports that he does not drink alcohol and does not use drugs. 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   Review of Systems  Constitutional:  Negative for chills and fever.  HENT:  Negative for congestion, hearing loss, sinus pain, sore throat and tinnitus.   Eyes:  Negative for blurred vision and double vision.  Respiratory:  Positive for cough (smokers). Negative for hemoptysis, sputum production, shortness of breath and wheezing.   Cardiovascular:  Negative for chest pain, palpitations and leg swelling.  Gastrointestinal:  Negative for abdominal pain, constipation, diarrhea, heartburn, nausea and vomiting.  Genitourinary:  Negative for dysuria and urgency.  Musculoskeletal:  Positive for joint pain (left shoulder). Negative for back pain, falls, myalgias and neck pain.  Skin:  Negative for rash.  Neurological:  Negative for dizziness, tingling, tremors, weakness and headaches.  Endo/Heme/Allergies:  Does not bruise/bleed easily.   Psychiatric/Behavioral:  Negative for depression and suicidal ideas. The patient is not nervous/anxious and does not have insomnia.      Physical Exam: Estimated body mass index is 26.93 kg/m as calculated from the following:   Height as of 01/09/23: 5' 9.5" (1.765 m).   Weight as of 01/09/23: 185 lb (83.9 kg). There were no  vitals filed for this visit.   General Appearance: Well nourished, in no apparent distress. Eyes: PERRLA, EOMs, conjunctiva no swelling or erythema, normal fundi and vessels. Sinuses: No Frontal/maxillary tenderness ENT/Mouth: Ext aud canals clear, normal light reflex with TMs without erythema, bulging. Good dentition. No erythema, swelling, or exudate on post pharynx. Tonsils not swollen or erythematous. Hearing normal.  Neck: Supple, thyroid normal. No bruits Respiratory: Respiratory effort normal, BS equal bilaterally with mild expiratory wheeze without rales, rhonchi, or stridor. Cardio: RRR without murmurs, rubs or gallops. Brisk peripheral pulses without edema.  Chest: symmetric, with normal excursions and percussion. Abdomen: Soft, obese +BS. Non tender, no guarding, rebound, hernias, masses, or organomegaly. .  Lymphatics: Non tender without lymphadenopathy.  Genitourinary: defer Musculoskeletal: Full ROM all peripheral extremities,5/5 strength, and normal gait. Limited ROM of left shoulder laterally, moving behind and reaching overhead. Pain with movement of left shoulder Skin: Warm, dry without rashes, lesions, ecchymosis.  Neuro: Cranial nerves intact, reflexes equal bilaterally. Normal muscle tone, no cerebellar symptoms. Sensation intact.  Psych: Awake and oriented X 3, normal affect, Insight and Judgment appropriate.   EKG: NSR with PAC's   [facility]  02/05/2023

## 2023-02-06 ENCOUNTER — Encounter: Payer: Self-pay | Admitting: Nurse Practitioner

## 2023-02-06 ENCOUNTER — Ambulatory Visit (INDEPENDENT_AMBULATORY_CARE_PROVIDER_SITE_OTHER): Payer: 59 | Admitting: Nurse Practitioner

## 2023-02-06 VITALS — BP 96/48 | HR 78 | Temp 97.7°F | Ht 67.5 in | Wt 186.8 lb

## 2023-02-06 DIAGNOSIS — R911 Solitary pulmonary nodule: Secondary | ICD-10-CM | POA: Insufficient documentation

## 2023-02-06 DIAGNOSIS — Z0000 Encounter for general adult medical examination without abnormal findings: Secondary | ICD-10-CM | POA: Diagnosis not present

## 2023-02-06 DIAGNOSIS — I10 Essential (primary) hypertension: Secondary | ICD-10-CM

## 2023-02-06 DIAGNOSIS — Z125 Encounter for screening for malignant neoplasm of prostate: Secondary | ICD-10-CM

## 2023-02-06 DIAGNOSIS — J45909 Unspecified asthma, uncomplicated: Secondary | ICD-10-CM

## 2023-02-06 DIAGNOSIS — Z136 Encounter for screening for cardiovascular disorders: Secondary | ICD-10-CM

## 2023-02-06 DIAGNOSIS — J449 Chronic obstructive pulmonary disease, unspecified: Secondary | ICD-10-CM

## 2023-02-06 DIAGNOSIS — I700 Atherosclerosis of aorta: Secondary | ICD-10-CM | POA: Diagnosis not present

## 2023-02-06 DIAGNOSIS — I491 Atrial premature depolarization: Secondary | ICD-10-CM

## 2023-02-06 DIAGNOSIS — E559 Vitamin D deficiency, unspecified: Secondary | ICD-10-CM

## 2023-02-06 DIAGNOSIS — Z1389 Encounter for screening for other disorder: Secondary | ICD-10-CM

## 2023-02-06 DIAGNOSIS — E1122 Type 2 diabetes mellitus with diabetic chronic kidney disease: Secondary | ICD-10-CM

## 2023-02-06 DIAGNOSIS — F17200 Nicotine dependence, unspecified, uncomplicated: Secondary | ICD-10-CM

## 2023-02-06 DIAGNOSIS — E1169 Type 2 diabetes mellitus with other specified complication: Secondary | ICD-10-CM

## 2023-02-06 DIAGNOSIS — Z0001 Encounter for general adult medical examination with abnormal findings: Secondary | ICD-10-CM

## 2023-02-06 DIAGNOSIS — Z79899 Other long term (current) drug therapy: Secondary | ICD-10-CM

## 2023-02-06 DIAGNOSIS — Z1329 Encounter for screening for other suspected endocrine disorder: Secondary | ICD-10-CM

## 2023-02-06 MED ORDER — MOUNJARO 7.5 MG/0.5ML ~~LOC~~ SOAJ: 7.5000 mg | SUBCUTANEOUS | 2 refills | Status: DC

## 2023-02-06 MED ORDER — METFORMIN HCL ER 500 MG PO TB24: ORAL_TABLET | ORAL | 2 refills | Status: DC

## 2023-02-06 NOTE — Patient Instructions (Signed)
Stop Losartan, Check BP daily in the AM.  Keep a log on a piece of paper and bring with you to your recheck in 4 weeks

## 2023-02-07 LAB — CBC WITH DIFFERENTIAL/PLATELET
Absolute Monocytes: 719 cells/uL (ref 200–950)
Basophils Absolute: 33 cells/uL (ref 0–200)
Basophils Relative: 0.3 %
Eosinophils Absolute: 44 cells/uL (ref 15–500)
Eosinophils Relative: 0.4 %
HCT: 40.2 % (ref 38.5–50.0)
Hemoglobin: 13.8 g/dL (ref 13.2–17.1)
Lymphs Abs: 2354 cells/uL (ref 850–3900)
MCH: 29.7 pg (ref 27.0–33.0)
MCHC: 34.3 g/dL (ref 32.0–36.0)
MCV: 86.5 fL (ref 80.0–100.0)
MPV: 9.8 fL (ref 7.5–12.5)
Monocytes Relative: 6.6 %
Neutro Abs: 7750 cells/uL (ref 1500–7800)
Neutrophils Relative %: 71.1 %
Platelets: 157 10*3/uL (ref 140–400)
RBC: 4.65 10*6/uL (ref 4.20–5.80)
RDW: 13.1 % (ref 11.0–15.0)
Total Lymphocyte: 21.6 %
WBC: 10.9 10*3/uL — ABNORMAL HIGH (ref 3.8–10.8)

## 2023-02-07 LAB — LIPID PANEL
Cholesterol: 100 mg/dL (ref <200)
HDL: 38 mg/dL — ABNORMAL LOW (ref >=40)
LDL Cholesterol (Calc): 46 mg/dL (calc)
Non-HDL Cholesterol (Calc): 62 mg/dL (calc) (ref <130)
Total CHOL/HDL Ratio: 2.6 (calc) (ref <5.0)
Triglycerides: 75 mg/dL (ref <150)

## 2023-02-07 LAB — URINALYSIS, ROUTINE W REFLEX MICROSCOPIC
Bilirubin Urine: NEGATIVE
Glucose, UA: NEGATIVE
Hgb urine dipstick: NEGATIVE
Ketones, ur: NEGATIVE
Leukocytes,Ua: NEGATIVE
Nitrite: NEGATIVE
Protein, ur: NEGATIVE
Specific Gravity, Urine: 1.016 (ref 1.001–1.035)
pH: 5.5 (ref 5.0–8.0)

## 2023-02-07 LAB — COMPLETE METABOLIC PANEL WITH GFR
AG Ratio: 1.6 (calc) (ref 1.0–2.5)
ALT: 9 U/L (ref 9–46)
AST: 11 U/L (ref 10–35)
Albumin: 4.3 g/dL (ref 3.6–5.1)
Alkaline phosphatase (APISO): 65 U/L (ref 35–144)
BUN: 23 mg/dL (ref 7–25)
CO2: 28 mmol/L (ref 20–32)
Calcium: 9.7 mg/dL (ref 8.6–10.3)
Chloride: 102 mmol/L (ref 98–110)
Creat: 1.32 mg/dL (ref 0.70–1.35)
Globulin: 2.7 g/dL (calc) (ref 1.9–3.7)
Glucose, Bld: 83 mg/dL (ref 65–99)
Potassium: 4.9 mmol/L (ref 3.5–5.3)
Sodium: 137 mmol/L (ref 135–146)
Total Bilirubin: 0.6 mg/dL (ref 0.2–1.2)
Total Protein: 7 g/dL (ref 6.1–8.1)
eGFR: 60 mL/min/{1.73_m2} (ref >=60)

## 2023-02-07 LAB — VITAMIN D 25 HYDROXY (VIT D DEFICIENCY, FRACTURES): Vit D, 25-Hydroxy: 82 ng/mL (ref 30–100)

## 2023-02-07 LAB — HEMOGLOBIN A1C
Hgb A1c MFr Bld: 5.6 % of total Hgb (ref <5.7)
Mean Plasma Glucose: 114 mg/dL
eAG (mmol/L): 6.3 mmol/L

## 2023-02-07 LAB — MAGNESIUM: Magnesium: 1.8 mg/dL (ref 1.5–2.5)

## 2023-02-07 LAB — PSA: PSA: 0.32 ng/mL (ref <=4.00)

## 2023-02-07 LAB — MICROALBUMIN / CREATININE URINE RATIO
Creatinine, Urine: 127 mg/dL (ref 20–320)
Microalb Creat Ratio: 21 mg/g creat (ref <30)
Microalb, Ur: 2.7 mg/dL

## 2023-02-07 LAB — TSH: TSH: 1.94 mIU/L (ref 0.40–4.50)

## 2023-03-06 NOTE — Progress Notes (Unsigned)
Assessment and Plan:  There are no diagnoses linked to this encounter.    Further disposition pending results of labs. Discussed med's effects and SE's.   Over 30 minutes of exam, counseling, chart review, and critical decision making was performed.   Future Appointments  Date Time Provider Department Center  03/07/2023  2:30 PM Raynelle Dick, NP [facility] None  03/27/2023  3:00 PM [facility] 1 [facility]  02/06/2024  2:00 PM Raynelle Dick, NP [facility] None    ------------------------------------------------------------------------------------------------------------------   HPI There were no vitals taken for this visit. 67 y.o.male presents for  Past Medical History:  Diagnosis Date   Arthritis    LEFT shoulder/RIGHT hand   Asthma    uses inhaler   COPD (chronic obstructive pulmonary disease) (HCC)    stage 1   Erectile dysfunction    Hyperlipidemia    on meds   Hypertension    on meds   Other testicular hypofunction    Sleep apnea    uses CPAP nightly   Type II or unspecified type diabetes mellitus without mention of complication, not stated as uncontrolled    on meds     Allergies  Allergen Reactions   Flaxseed (Linseed)     Other reaction(s): Other Bad after taste.   Flax [Flax Seed Oil]     Current Outpatient Medications on File Prior to Visit  Medication Sig   albuterol (PROAIR HFA) 108 (90 Base) MCG/ACT inhaler Use  2 inhalations  15 minutes  apart every 4 hours  as needed to Rescue   BREO ELLIPTA 100-25 MCG/ACT AEPB USE 1 INHALATION BY MOUTH DAILY   glucose blood test strip Check blood sugars fasting daily   losartan (COZAAR) 50 MG tablet TAKE 1 TABLET BY MOUTH EVERY DAY FOR BLOOD PRESSURE   metFORMIN (GLUCOPHAGE-XR) 500 MG 24 hr tablet Take 2 tabs with meal once a day   rosuvastatin (CRESTOR) 5 MG tablet Take 1 tablet (5 mg total) by mouth daily.   tadalafil (CIALIS) 20 MG tablet TAKE 1/2 TO 1 TABLET EVERY 2 TO 3 DAYS IF NEEDED    tirzepatide (MOUNJARO) 7.5 MG/0.5ML Pen Inject 7.5 mg into the skin once a week.   Vitamin D, Ergocalciferol, (DRISDOL) 1.25 MG (50000 UNIT) CAPS capsule 1 capsule once a week for Vitamin D Deficiency   No current facility-administered medications on file prior to visit.    ROS: all negative except above.   Physical Exam:  There were no vitals taken for this visit.  General Appearance: Well nourished, in no apparent distress. Eyes: PERRLA, EOMs, conjunctiva no swelling or erythema Sinuses: No Frontal/maxillary tenderness ENT/Mouth: Ext aud canals clear, TMs without erythema, bulging. No erythema, swelling, or exudate on post pharynx.  Tonsils not swollen or erythematous. Hearing normal.  Neck: Supple, thyroid normal.  Respiratory: Respiratory effort normal, BS equal bilaterally without rales, rhonchi, wheezing or stridor.  Cardio: RRR with no MRGs. Brisk peripheral pulses without edema.  Abdomen: Soft, + BS.  Non tender, no guarding, rebound, hernias, masses. Lymphatics: Non tender without lymphadenopathy.  Musculoskeletal: Full ROM, 5/5 strength, normal gait.  Skin: Warm, dry without rashes, lesions, ecchymosis.  Neuro: Cranial nerves intact. Normal muscle tone, no cerebellar symptoms. Sensation intact.  Psych: Awake and oriented X 3, normal affect, Insight and Judgment appropriate.     Raynelle Dick, NP 1:37 PM [facility]

## 2023-03-07 ENCOUNTER — Encounter: Payer: Self-pay | Admitting: Nurse Practitioner

## 2023-03-07 ENCOUNTER — Ambulatory Visit: Payer: 59 | Admitting: Nurse Practitioner

## 2023-03-07 VITALS — BP 84/60 | HR 65 | Temp 97.6°F | Resp 16 | Ht 67.5 in | Wt 188.2 lb

## 2023-03-07 DIAGNOSIS — N289 Disorder of kidney and ureter, unspecified: Secondary | ICD-10-CM | POA: Diagnosis not present

## 2023-03-07 DIAGNOSIS — I959 Hypotension, unspecified: Secondary | ICD-10-CM | POA: Diagnosis not present

## 2023-03-07 MED ORDER — FLUDROCORTISONE ACETATE 0.1 MG PO TABS: 0.1000 mg | ORAL_TABLET | Freq: Two times a day (BID) | ORAL | 2 refills | Status: DC

## 2023-03-07 NOTE — Patient Instructions (Addendum)
Take Fludrocortisone twice a day Monitor BP and goal of consistently greater than 100/60  If you notice pronounced swelling in your feet please let me know   Follow up in 2 weeks   Fludrocortisone Tablets What is this medication? FLUDROCORTISONE (floo droe KOR ti sone) treats low levels of cortisol in the body (adrenal insufficiency). It works by replacing cortisol normally made by the body. Cortisol is a hormone that plays an important role in how the body responds to stress, illness, and injury. It belongs to a group of medications called steroids. This medicine may be used for other purposes; ask your health care provider or pharmacist if you have questions. COMMON BRAND NAME(S): Florinef What should I tell my care team before I take this medication? They need to know if you have any of these conditions: Cushing syndrome Diabetes Glaucoma Heart disease High blood pressure Infection, such as tuberculosis (TB) or other bacterial, fungal, or viral infections Kidney disease Liver disease Mental health conditions Myasthenia gravis Osteoporosis Seizures Stomach or intestine problems Thyroid disease An unusual or allergic reaction to fludrocortisone, other medications, foods, dyes, or preservatives Pregnant or trying to get pregnant Breast-feeding How should I use this medication? Take this medication by mouth with water. Take it as directed on the prescription label at the same time every day. Take it with food. Keep taking it unless your care team tells you to stop. Stopping it too quickly can cause serious side effects. Talk to your care team about the use of this medication in children. While it may be prescribed to children for selected conditions, precautions do apply. People 65 years and older may have a stronger reaction and need a smaller dose. Overdosage: If you think you have taken too much of this medicine contact a poison control center or emergency room at once. NOTE:  This medicine is only for you. Do not share this medicine with others. What if I miss a dose? If you miss a dose, take it as soon as you can. If it is almost time for your next dose, take only that dose. Do not take double or extra doses. What may interact with this medication? Do not take this medication with any of the following: Live virus vaccines Metyrapone This medication may also interact with the following: Aspirin and aspirin-like medications NSAIDs, medications for pain and inflammation, such as ibuprofen or naproxen Warfarin This list may not describe all possible interactions. Give your health care provider a list of all the medicines, herbs, non-prescription drugs, or dietary supplements you use. Also tell them if you smoke, drink alcohol, or use illegal drugs. Some items may interact with your medicine. What should I watch for while using this medication? Visit your care team for regular checks on your progress. Tell your care team if your symptoms do not start to get better or if they get worse. This medication may increase your risk of getting an infection. Call your care team for advice if you get a fever, chills, sore throat, or other symptoms of a cold or flu. Do not treat yourself. Try to avoid being around people who are sick. If you have not had the measles or chickenpox vaccines, tell your care team right away if you are around someone with these viruses. If you are going to need surgery or other procedure, tell your care team that you are using this medication. You may need to be on a special diet while you are taking this medication. Ask your  care team. Also, find out how many glasses of fluids you need to drink each day. This medication may increase blood sugar. The risk may be higher in patients who already have diabetes. Ask your care team what you can do to lower your risk of diabetes while taking this medication. What side effects may I notice from receiving this  medication? Side effects that you should report to your care team as soon as possible: Allergic reactions--skin rash, itching, hives, swelling of the face, lips, tongue, or throat Cushing syndrome--increased fat around the midsection, upper back, neck, or face, pink or purple stretch marks on the skin, thinning, fragile skin that easily bruises, unexpected hair growth High blood sugar (hyperglycemia)--increased thirst or amount of urine, unusual weakness or fatigue, blurry vision Increase in blood pressure Infection--fever, chills, cough, sore throat, wounds that don't heal, pain or trouble when passing urine, general feeling of discomfort or being unwell Low adrenal gland function--nausea, vomiting, loss of appetite, unusual weakness or fatigue, dizziness Mood and behavior changes--anxiety, nervousness, confusion, hallucinations, irritability, hostility, thoughts of suicide or self-harm, worsening mood, feelings of depression Stomach bleeding--bloody or black, tar-like stools, vomiting blood or brown material that looks like coffee grounds Swelling of the ankles, hands, or feet Side effects that usually do not require medical attention (report to your care team if they continue or are bothersome): Acne General discomfort and fatigue Headache Increase in appetite Nausea Trouble sleeping Weight gain This list may not describe all possible side effects. Call your doctor for medical advice about side effects. You may report side effects to FDA at 1-800-FDA-1088. Where should I keep my medication? Keep out of the reach of children and pets. Store at room temperature between 20 and 25 degrees C (68 and 77 degrees F). Protect from excessive heat. Get rid of any unused medication after the expiration date. To get rid of medications that are no longer needed or have expired: Take the medication to a medication take-back program. Check with your pharmacy or law enforcement to find a location. If you  cannot return the medication, check the label or package insert to see if the medication should be thrown out in the garbage or flushed down the toilet. If you are not sure, ask your care team. If it is safe to put it in the trash, empty the medication out of the container. Mix the medication with cat litter, dirt, coffee grounds, or other unwanted substance. Seal the mixture in a bag or container. Put it in the trash. NOTE: This sheet is a summary. It may not cover all possible information. If you have questions about this medicine, talk to your doctor, pharmacist, or health care provider.  2023 Elsevier/Gold Standard (2022-02-08 00:00:00)

## 2023-03-08 LAB — BASIC METABOLIC PANEL WITH GFR
BUN: 15 mg/dL (ref 7–25)
CO2: 28 mmol/L (ref 20–32)
Calcium: 9.2 mg/dL (ref 8.6–10.3)
Chloride: 103 mmol/L (ref 98–110)
Creat: 0.93 mg/dL (ref 0.70–1.35)
Glucose, Bld: 100 mg/dL — ABNORMAL HIGH (ref 65–99)
Potassium: 4.3 mmol/L (ref 3.5–5.3)
Sodium: 138 mmol/L (ref 135–146)
eGFR: 92 mL/min/{1.73_m2} (ref >=60)

## 2023-03-16 ENCOUNTER — Other Ambulatory Visit: Payer: Self-pay | Admitting: Nurse Practitioner

## 2023-03-24 ENCOUNTER — Other Ambulatory Visit: Payer: Self-pay | Admitting: Nurse Practitioner

## 2023-03-24 DIAGNOSIS — E1122 Type 2 diabetes mellitus with diabetic chronic kidney disease: Secondary | ICD-10-CM

## 2023-03-27 ENCOUNTER — Ambulatory Visit
Admission: RE | Admit: 2023-03-27 | Discharge: 2023-03-27 | Disposition: A | Discharge status: Home / Self Care | Payer: 59 | Source: Ambulatory Visit | Attending: Nurse Practitioner | Admitting: Nurse Practitioner

## 2023-03-27 DIAGNOSIS — R911 Solitary pulmonary nodule: Secondary | ICD-10-CM

## 2023-03-27 NOTE — Progress Notes (Unsigned)
Assessment and Plan:  Towan was seen today for follow-up.  Diagnoses and all orders for this visit:  Hypotension, unspecified hypotension type Decrease Florinef 0.1 mg to once a day at breakfast Start midodrine 5 mg BID Monitor weight and BP daily and keep log Follow up in 2 weeks Go to the ER if any chest pain, shortness of breath, nausea, dizziness, severe HA, changes vision/speech  -     BASIC METABOLIC PANEL WITH GFR -     midodrine (PROAMATINE) 5 MG tablet; Take 1 tablet (5 mg total) by mouth 2 (two) times daily with a meal.  Type 2 diabetes mellitus with stage 2 chronic kidney disease, without long-term current use of insulin (HCC) Continue Mounjaro 7.5 mg QW Decrease Metformin to 500 mg 1 tab at supper Continue to monitor BS daily  Medication management -     BASIC METABOLIC PANEL WITH GFR -     midodrine (PROAMATINE) 5 MG tablet; Take 1 tablet (5 mg total) by mouth 2 (two) times daily with a meal.  Edema, unspecified type Will decrease Florinef 0.1 mg to 1 tab a day --BMP today to check sodium - Weigh daily and If gains more than 5 pound in 1 day notify the office      Further disposition pending results of labs. Discussed med's effects and SE's.   Over 30 minutes of exam, counseling, chart review, and critical decision making was performed.   Future Appointments  Date Time Provider Department Center  02/06/2024  2:00 PM Raynelle Dick, NP [facility] None    ------------------------------------------------------------------------------------------------------------------   HPI BP 110/62   Pulse (!) 56   Temp 97.7 F (36.5 C)   Ht 5' 7.5" (1.715 m)   Wt 197 lb 12.8 oz (89.7 kg)   SpO2 96%   BMI 30.52 kg/m   67 y.o.male presents for reevaluation of blood pressure .  At CPE on 02/06/23 was noted BP is running low 98/58 and Losartan 50 mg was stopped and he was to check BP at home and bring log. 03/07/23 was running very low as were his home BP's -running  80-98/58-70 Denies headaches, dizziness, blurred vision.  He has noticed swelling of his legs. Bp's at home have been high 90's-110/60's. BP today: BP Readings from Last 3 Encounters:  03/28/23 110/62  03/07/23 (!) 84/60  02/06/23 (!) 96/48    BMI is Body mass index is 30.52 kg/m., he has not been working on diet and exercise. Just recently came back from vacation Wt Readings from Last 3 Encounters:  03/28/23 197 lb 12.8 oz (89.7 kg)  03/07/23 188 lb 3.2 oz (85.4 kg)  02/06/23 186 lb 12.8 oz (84.7 kg)    Last metabolic panel Lab Results  Component Value Date   GLUCOSE 100 (H) 03/07/2023   NA 138 03/07/2023   K 4.3 03/07/2023   CL 103 03/07/2023   CO2 28 03/07/2023   BUN 15 03/07/2023   CREATININE 0.93 03/07/2023   EGFR 92 03/07/2023   CALCIUM 9.2 03/07/2023   PROT 7.0 02/06/2023   ALBUMIN 4.1 03/12/2017   BILITOT 0.6 02/06/2023   ALKPHOS 74 03/12/2017   AST 11 02/06/2023   ALT 9 02/06/2023    Currently taking Mounjaro 7.5 mg qw and Metformin 500 mg 2 tabs at dinner.  BS running 70-90 Lab Results  Component Value Date   HGBA1C 5.6 02/06/2023        Past Medical History:  Diagnosis Date   Arthritis  LEFT shoulder/RIGHT hand   Asthma    uses inhaler   COPD (chronic obstructive pulmonary disease) (HCC)    stage 1   Erectile dysfunction    Hyperlipidemia    on meds   Hypertension    on meds   Other testicular hypofunction    Sleep apnea    uses CPAP nightly   Type II or unspecified type diabetes mellitus without mention of complication, not stated as uncontrolled    on meds     Allergies  Allergen Reactions   Flaxseed (Linseed)     Other reaction(s): Other Bad after taste.   Flax [Flax Seed Oil]     Current Outpatient Medications on File Prior to Visit  Medication Sig   albuterol (VENTOLIN HFA) 108 (90 Base) MCG/ACT inhaler USE 2 INHALATIONS BY MOUTH 15  MINUTES APART EVERY 4 HOURS AS  NEEDED TO RESCUE   BREO ELLIPTA 100-25 MCG/ACT AEPB USE 1  INHALATION BY MOUTH DAILY   fludrocortisone (FLORINEF) 0.1 MG tablet Take 1 tablet (0.1 mg total) by mouth 2 (two) times daily.   glucose blood test strip Check blood sugars fasting daily   metFORMIN (GLUCOPHAGE-XR) 500 MG 24 hr tablet Take 2 tabs with meal once a day   rosuvastatin (CRESTOR) 5 MG tablet Take 1 tablet (5 mg total) by mouth daily.   tadalafil (CIALIS) 20 MG tablet TAKE 1/2 TO 1 TABLET EVERY 2 TO 3 DAYS IF NEEDED   tirzepatide (MOUNJARO) 7.5 MG/0.5ML Pen INJECT 7.5 MG SUBCUTANEOUSLY WEEKLY   Vitamin D, Ergocalciferol, (DRISDOL) 1.25 MG (50000 UNIT) CAPS capsule 1 capsule once a week for Vitamin D Deficiency   losartan (COZAAR) 50 MG tablet TAKE 1 TABLET BY MOUTH EVERY DAY FOR BLOOD PRESSURE (Patient not taking: Reported on 03/07/2023)   No current facility-administered medications on file prior to visit.    ROS: all negative except above.   Physical Exam:  BP 110/62   Pulse (!) 56   Temp 97.7 F (36.5 C)   Ht 5' 7.5" (1.715 m)   Wt 197 lb 12.8 oz (89.7 kg)   SpO2 96%   BMI 30.52 kg/m   General Appearance: Well nourished, in no apparent distress. Eyes: PERRLA, EOMs, conjunctiva no swelling or erythema Respiratory: Respiratory effort normal, BS equal bilaterally without rales, rhonchi, wheezing or stridor.  Cardio: RRR with no MRGs. Brisk peripheral pulses with 1+ pitting edema ol lower legs Abdomen: Soft, + BS.  Non tender, no guarding, rebound, hernias, masses. Lymphatics: Non tender without lymphadenopathy.  Musculoskeletal: Full ROM, 5/5 strength, normal gait.  Skin: Warm, dry without rashes, lesions, ecchymosis.  Neuro: Cranial nerves intact. Normal muscle tone, no cerebellar symptoms. Sensation intact.  Psych: Awake and oriented X 3, normal affect, Insight and Judgment appropriate.     Raynelle Dick, NP 11:24 AM [facility]

## 2023-03-28 ENCOUNTER — Ambulatory Visit (INDEPENDENT_AMBULATORY_CARE_PROVIDER_SITE_OTHER): Payer: 59 | Admitting: Nurse Practitioner

## 2023-03-28 ENCOUNTER — Encounter: Payer: Self-pay | Admitting: Nurse Practitioner

## 2023-03-28 VITALS — BP 110/62 | HR 56 | Temp 97.7°F | Ht 67.5 in | Wt 197.8 lb

## 2023-03-28 DIAGNOSIS — E1122 Type 2 diabetes mellitus with diabetic chronic kidney disease: Secondary | ICD-10-CM

## 2023-03-28 DIAGNOSIS — I959 Hypotension, unspecified: Secondary | ICD-10-CM | POA: Diagnosis not present

## 2023-03-28 DIAGNOSIS — Z79899 Other long term (current) drug therapy: Secondary | ICD-10-CM

## 2023-03-28 DIAGNOSIS — R609 Edema, unspecified: Secondary | ICD-10-CM

## 2023-03-28 DIAGNOSIS — N182 Chronic kidney disease, stage 2 (mild): Secondary | ICD-10-CM

## 2023-03-28 MED ORDER — MIDODRINE HCL 5 MG PO TABS: 5.0000 mg | ORAL_TABLET | Freq: Two times a day (BID) | ORAL | 1 refills | Status: DC

## 2023-03-28 NOTE — Patient Instructions (Addendum)
Decrease Florinef to once a day  Start Midodrine with breakfast and dinner  Monitor BP in AM and PM if greater than 130/80 notify the office. If BP at night is lower than 90/60 please let me know and will plan to increase Midodrine to three times a day with meals  Follow up in 2 weeks to recheck sodium and potassium and bring blood pressure log

## 2023-03-29 LAB — BASIC METABOLIC PANEL WITH GFR
BUN: 15 mg/dL (ref 7–25)
CO2: 29 mmol/L (ref 20–32)
Calcium: 8.8 mg/dL (ref 8.6–10.3)
Chloride: 105 mmol/L (ref 98–110)
Creat: 0.78 mg/dL (ref 0.70–1.35)
Glucose, Bld: 88 mg/dL (ref 65–99)
Potassium: 3.8 mmol/L (ref 3.5–5.3)
Sodium: 140 mmol/L (ref 135–146)
eGFR: 100 mL/min/{1.73_m2} (ref >=60)

## 2023-03-30 ENCOUNTER — Encounter: Payer: Self-pay | Admitting: Nurse Practitioner

## 2023-04-04 ENCOUNTER — Encounter: Payer: Self-pay | Admitting: Nurse Practitioner

## 2023-04-09 NOTE — Progress Notes (Unsigned)
Assessment and Plan:  Luis Bonilla was seen today for follow-up.  Diagnoses and all orders for this visit:  Hypotension, unspecified hypotension type Decrease Florinef 0.1 mg to once a day at breakfast Start midodrine 5 mg BID Monitor weight and BP daily and keep log Follow up in 2 weeks Go to the ER if any chest pain, shortness of breath, nausea, dizziness, severe HA, changes vision/speech  -     BASIC METABOLIC PANEL WITH GFR -     midodrine (PROAMATINE) 5 MG tablet; Take 1 tablet (5 mg total) by mouth 2 (two) times daily with a meal.  Type 2 diabetes mellitus with stage 2 chronic kidney disease, without long-term current use of insulin (HCC) Continue Mounjaro 7.5 mg QW Decrease Metformin to 500 mg 1 tab at supper Continue to monitor BS daily  Medication management -     BASIC METABOLIC PANEL WITH GFR -     midodrine (PROAMATINE) 5 MG tablet; Take 1 tablet (5 mg total) by mouth 2 (two) times daily with a meal.  Edema, unspecified type Will decrease Florinef 0.1 mg to 1 tab a day --BMP today to check sodium - Weigh daily and If gains more than 5 pound in 1 day notify the office      Further disposition pending results of labs. Discussed med's effects and SE's.   Over 30 minutes of exam, counseling, chart review, and critical decision making was performed.   Future Appointments  Date Time Provider Department Center  04/11/2023 10:45 AM Raynelle Dick, NP [facility] None  02/06/2024  2:00 PM Raynelle Dick, NP [facility] None    ------------------------------------------------------------------------------------------------------------------   HPI There were no vitals taken for this visit.  67 y.o.male presents for reevaluation of blood pressure .  At CPE on 02/06/23 was noted BP is running low 98/58 and Losartan 50 mg was stopped and he was to check BP at home and bring log. 03/07/23 was running very low as were his home BP's -running 80-98/58-70 Denies headaches, dizziness,  blurred vision.  He has noticed swelling of his legs. Bp's at home have been high 90's-110/60's. BP today: BP Readings from Last 3 Encounters:  03/28/23 110/62  03/07/23 (!) 84/60  02/06/23 (!) 96/48    BMI is There is no height or weight on file to calculate BMI., he has not been working on diet and exercise. Just recently came back from vacation Wt Readings from Last 3 Encounters:  03/28/23 197 lb 12.8 oz (89.7 kg)  03/07/23 188 lb 3.2 oz (85.4 kg)  02/06/23 186 lb 12.8 oz (84.7 kg)    Last metabolic panel Lab Results  Component Value Date   GLUCOSE 88 03/28/2023   NA 140 03/28/2023   K 3.8 03/28/2023   CL 105 03/28/2023   CO2 29 03/28/2023   BUN 15 03/28/2023   CREATININE 0.78 03/28/2023   EGFR 100 03/28/2023   CALCIUM 8.8 03/28/2023   PROT 7.0 02/06/2023   ALBUMIN 4.1 03/12/2017   BILITOT 0.6 02/06/2023   ALKPHOS 74 03/12/2017   AST 11 02/06/2023   ALT 9 02/06/2023    Currently taking Mounjaro 7.5 mg qw and Metformin 500 mg 2 tabs at dinner.  BS running 70-90 Lab Results  Component Value Date   HGBA1C 5.6 02/06/2023        Past Medical History:  Diagnosis Date   Arthritis    LEFT shoulder/RIGHT hand   Asthma    uses inhaler   COPD (chronic obstructive pulmonary disease) (  HCC)    stage 1   Erectile dysfunction    Hyperlipidemia    on meds   Hypertension    on meds   Other testicular hypofunction    Sleep apnea    uses CPAP nightly   Type II or unspecified type diabetes mellitus without mention of complication, not stated as uncontrolled    on meds     Allergies  Allergen Reactions   Flaxseed (Linseed)     Other reaction(s): Other Bad after taste.   Flax [Flax Seed Oil]     Current Outpatient Medications on File Prior to Visit  Medication Sig   albuterol (VENTOLIN HFA) 108 (90 Base) MCG/ACT inhaler USE 2 INHALATIONS BY MOUTH 15  MINUTES APART EVERY 4 HOURS AS  NEEDED TO RESCUE   BREO ELLIPTA 100-25 MCG/ACT AEPB USE 1 INHALATION BY MOUTH DAILY    fludrocortisone (FLORINEF) 0.1 MG tablet Take 1 tablet (0.1 mg total) by mouth 2 (two) times daily.   glucose blood test strip Check blood sugars fasting daily   losartan (COZAAR) 50 MG tablet TAKE 1 TABLET BY MOUTH EVERY DAY FOR BLOOD PRESSURE (Patient not taking: Reported on 03/07/2023)   metFORMIN (GLUCOPHAGE-XR) 500 MG 24 hr tablet Take 2 tabs with meal once a day   midodrine (PROAMATINE) 5 MG tablet Take 1 tablet (5 mg total) by mouth 2 (two) times daily with a meal.   rosuvastatin (CRESTOR) 5 MG tablet Take 1 tablet (5 mg total) by mouth daily.   tadalafil (CIALIS) 20 MG tablet TAKE 1/2 TO 1 TABLET EVERY 2 TO 3 DAYS IF NEEDED   tirzepatide (MOUNJARO) 7.5 MG/0.5ML Pen INJECT 7.5 MG SUBCUTANEOUSLY WEEKLY   Vitamin D, Ergocalciferol, (DRISDOL) 1.25 MG (50000 UNIT) CAPS capsule 1 capsule once a week for Vitamin D Deficiency   No current facility-administered medications on file prior to visit.    ROS: all negative except above.   Physical Exam:  There were no vitals taken for this visit.  General Appearance: Well nourished, in no apparent distress. Eyes: PERRLA, EOMs, conjunctiva no swelling or erythema Respiratory: Respiratory effort normal, BS equal bilaterally without rales, rhonchi, wheezing or stridor.  Cardio: RRR with no MRGs. Brisk peripheral pulses with 1+ pitting edema ol lower legs Abdomen: Soft, + BS.  Non tender, no guarding, rebound, hernias, masses. Lymphatics: Non tender without lymphadenopathy.  Musculoskeletal: Full ROM, 5/5 strength, normal gait.  Skin: Warm, dry without rashes, lesions, ecchymosis.  Neuro: Cranial nerves intact. Normal muscle tone, no cerebellar symptoms. Sensation intact.  Psych: Awake and oriented X 3, normal affect, Insight and Judgment appropriate.     Raynelle Dick, NP 12:16 PM [facility]

## 2023-04-11 ENCOUNTER — Encounter: Payer: Self-pay | Admitting: Nurse Practitioner

## 2023-04-11 ENCOUNTER — Ambulatory Visit (INDEPENDENT_AMBULATORY_CARE_PROVIDER_SITE_OTHER): Payer: 59 | Admitting: Nurse Practitioner

## 2023-04-11 VITALS — BP 124/70 | HR 63 | Temp 97.9°F | Resp 17 | Ht 67.5 in | Wt 190.8 lb

## 2023-04-11 DIAGNOSIS — I959 Hypotension, unspecified: Secondary | ICD-10-CM | POA: Diagnosis not present

## 2023-04-11 DIAGNOSIS — R609 Edema, unspecified: Secondary | ICD-10-CM | POA: Diagnosis not present

## 2023-04-11 DIAGNOSIS — N182 Chronic kidney disease, stage 2 (mild): Secondary | ICD-10-CM

## 2023-04-11 DIAGNOSIS — Z79899 Other long term (current) drug therapy: Secondary | ICD-10-CM | POA: Diagnosis not present

## 2023-04-11 DIAGNOSIS — E1122 Type 2 diabetes mellitus with diabetic chronic kidney disease: Secondary | ICD-10-CM

## 2023-04-12 LAB — BASIC METABOLIC PANEL WITH GFR
BUN: 16 mg/dL (ref 7–25)
CO2: 27 mmol/L (ref 20–32)
Calcium: 9.4 mg/dL (ref 8.6–10.3)
Chloride: 106 mmol/L (ref 98–110)
Creat: 0.91 mg/dL (ref 0.70–1.35)
Glucose, Bld: 94 mg/dL (ref 65–99)
Potassium: 4.3 mmol/L (ref 3.5–5.3)
Sodium: 139 mmol/L (ref 135–146)
eGFR: 94 mL/min/{1.73_m2} (ref >=60)

## 2023-04-19 ENCOUNTER — Other Ambulatory Visit: Payer: Self-pay | Admitting: Nurse Practitioner

## 2023-04-19 DIAGNOSIS — Z79899 Other long term (current) drug therapy: Secondary | ICD-10-CM

## 2023-04-19 DIAGNOSIS — I959 Hypotension, unspecified: Secondary | ICD-10-CM

## 2023-06-04 ENCOUNTER — Other Ambulatory Visit: Payer: Self-pay | Admitting: Nurse Practitioner

## 2023-06-04 DIAGNOSIS — I959 Hypotension, unspecified: Secondary | ICD-10-CM

## 2023-06-11 ENCOUNTER — Other Ambulatory Visit (INDEPENDENT_AMBULATORY_CARE_PROVIDER_SITE_OTHER): Payer: 59

## 2023-06-11 ENCOUNTER — Ambulatory Visit: Payer: 59 | Admitting: Orthopaedic Surgery

## 2023-06-11 DIAGNOSIS — M7541 Impingement syndrome of right shoulder: Secondary | ICD-10-CM

## 2023-06-11 DIAGNOSIS — M25511 Pain in right shoulder: Secondary | ICD-10-CM

## 2023-06-11 MED ORDER — METHYLPREDNISOLONE ACETATE 40 MG/ML IJ SUSP
40.0000 mg | INTRAMUSCULAR | Status: AC | PRN
Start: 2023-06-11 — End: 2023-06-11
  Administered 2023-06-11: 40 mg via INTRA_ARTICULAR

## 2023-06-11 MED ORDER — LIDOCAINE HCL 1 % IJ SOLN
3.0000 mL | INTRAMUSCULAR | Status: AC | PRN
Start: 2023-06-11 — End: 2023-06-11
  Administered 2023-06-11: 3 mL

## 2023-06-11 NOTE — Progress Notes (Signed)
The patient is actually well-known to me.  He is a 66 year old gentleman who comes in with right shoulder pain for significant amount of time now with no known injury.  I have actually placed a steroid injection in his left shoulder subacromial outlet remotely and he said that is not given any problems since then.  He says the pain does wake him up at night on that right shoulder.  He does ride a motorcycle on a regular basis.  He says that hurts worse with laying down.  His blood glucose runs good with him being a diabetic.  He is lost a significant amount of weight since I saw him last.  His right shoulder has good motion but it definitely shows pain past 90 degrees of abduction.  There is positive Neer and Hawkins signs.  The rotator cuff itself seems strong.  He is able to reach behind himself well which is good.  3 views of the right shoulder show no acute findings.  He does have shoulder impingement syndrome and would definitely benefit from a steroid injection in his right shoulder.  He agreed to this and tolerated it well.  If this does not get better he knows to let us know.  All questions and concerns were addressed and answered    Procedure Note  Patient: Luis Bonilla             Date of Birth: 1956-04-25           MRN: [id number]             Visit Date: 06/11/2023  Procedures: Visit Diagnoses:  1. Right shoulder pain, unspecified chronicity   2. Impingement syndrome of right shoulder     Large Joint Inj: R subacromial bursa on 06/11/2023 10:20 AM Indications: pain and diagnostic evaluation Details: 22 G 1.5 in needle  Arthrogram: No  Medications: 3 mL lidocaine 1 %; 40 mg methylPREDNISolone acetate 40 MG/ML Outcome: tolerated well, no immediate complications Procedure, treatment alternatives, risks and benefits explained, specific risks discussed. Consent was given by the patient. Immediately prior to procedure a time out was called to verify the correct patient, procedure,  equipment, support staff and site/side marked as required. Patient was prepped and draped in the usual sterile fashion.

## 2023-06-24 IMAGING — CR DG SHOULDER 2+V*L*
3 series · 3 of 3 positions shown · non-contrast
Comparison: No pertinent prior exams available for comparison.

CLINICAL DATA: Provided history: Chronic left shoulder pain. Left
shoulder pain. Additional history provided by technologist: Patient
reports left shoulder pain with abduction for 4-5 months.

EXAM:
LEFT SHOULDER - 2+ VIEW

[w shoulder grashey left]
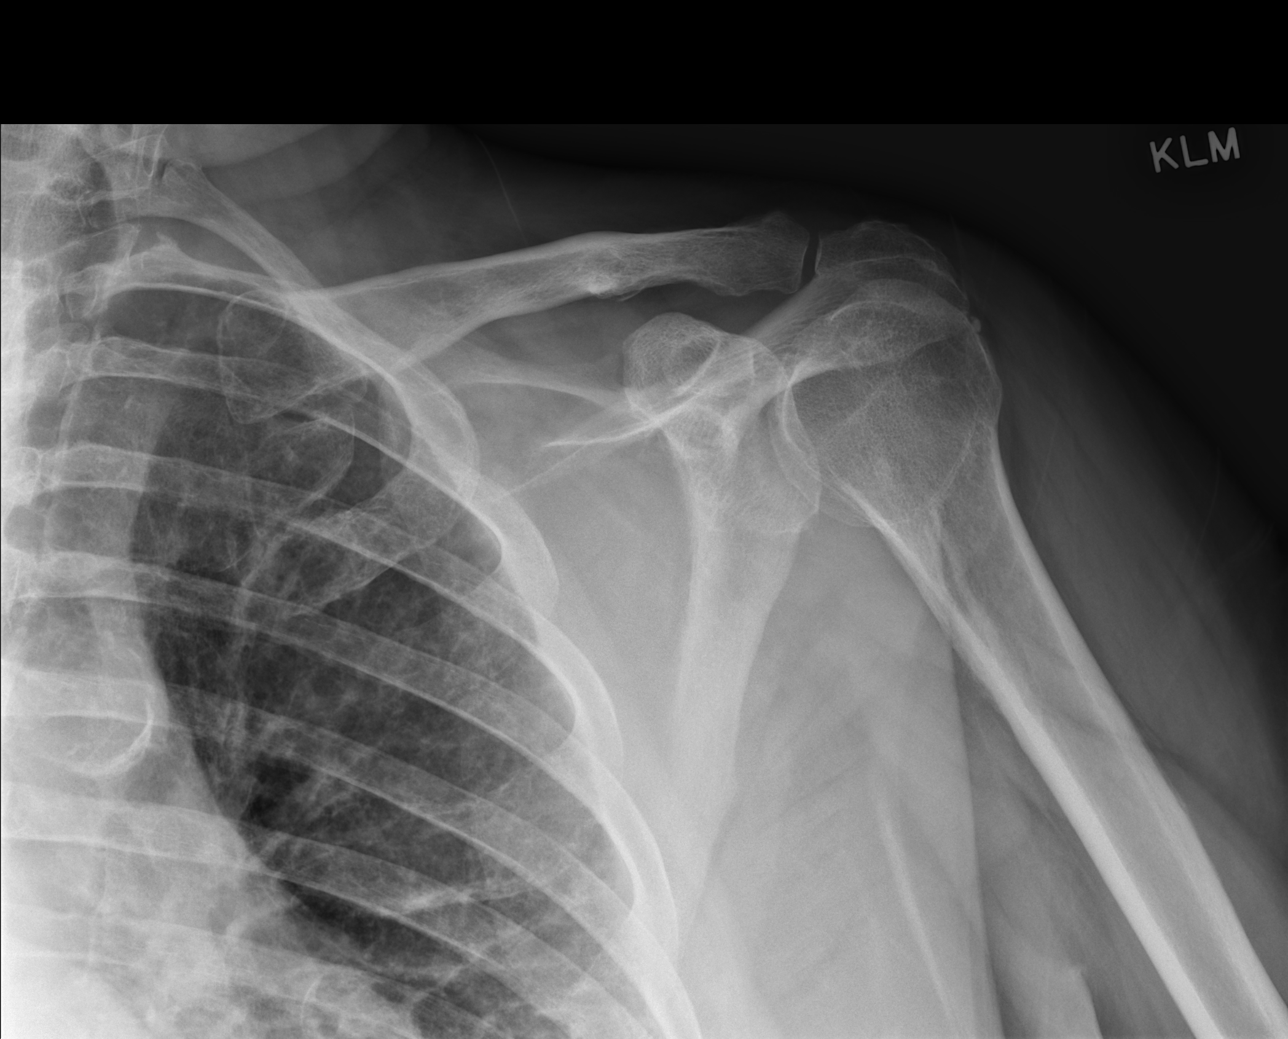

[w shoulder y-view left]
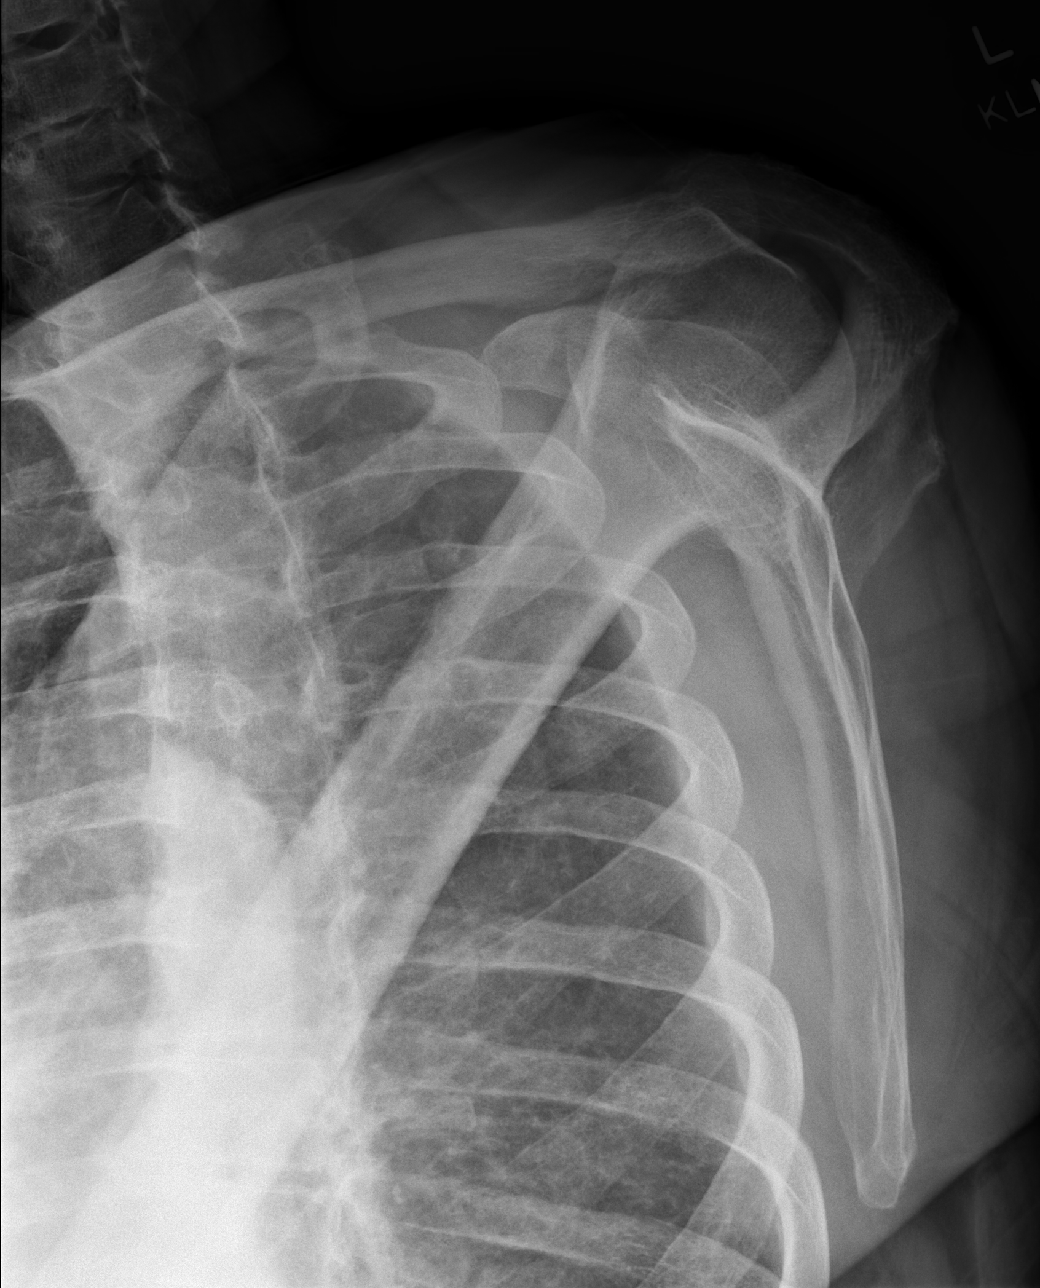

[x shoulder axillary left]
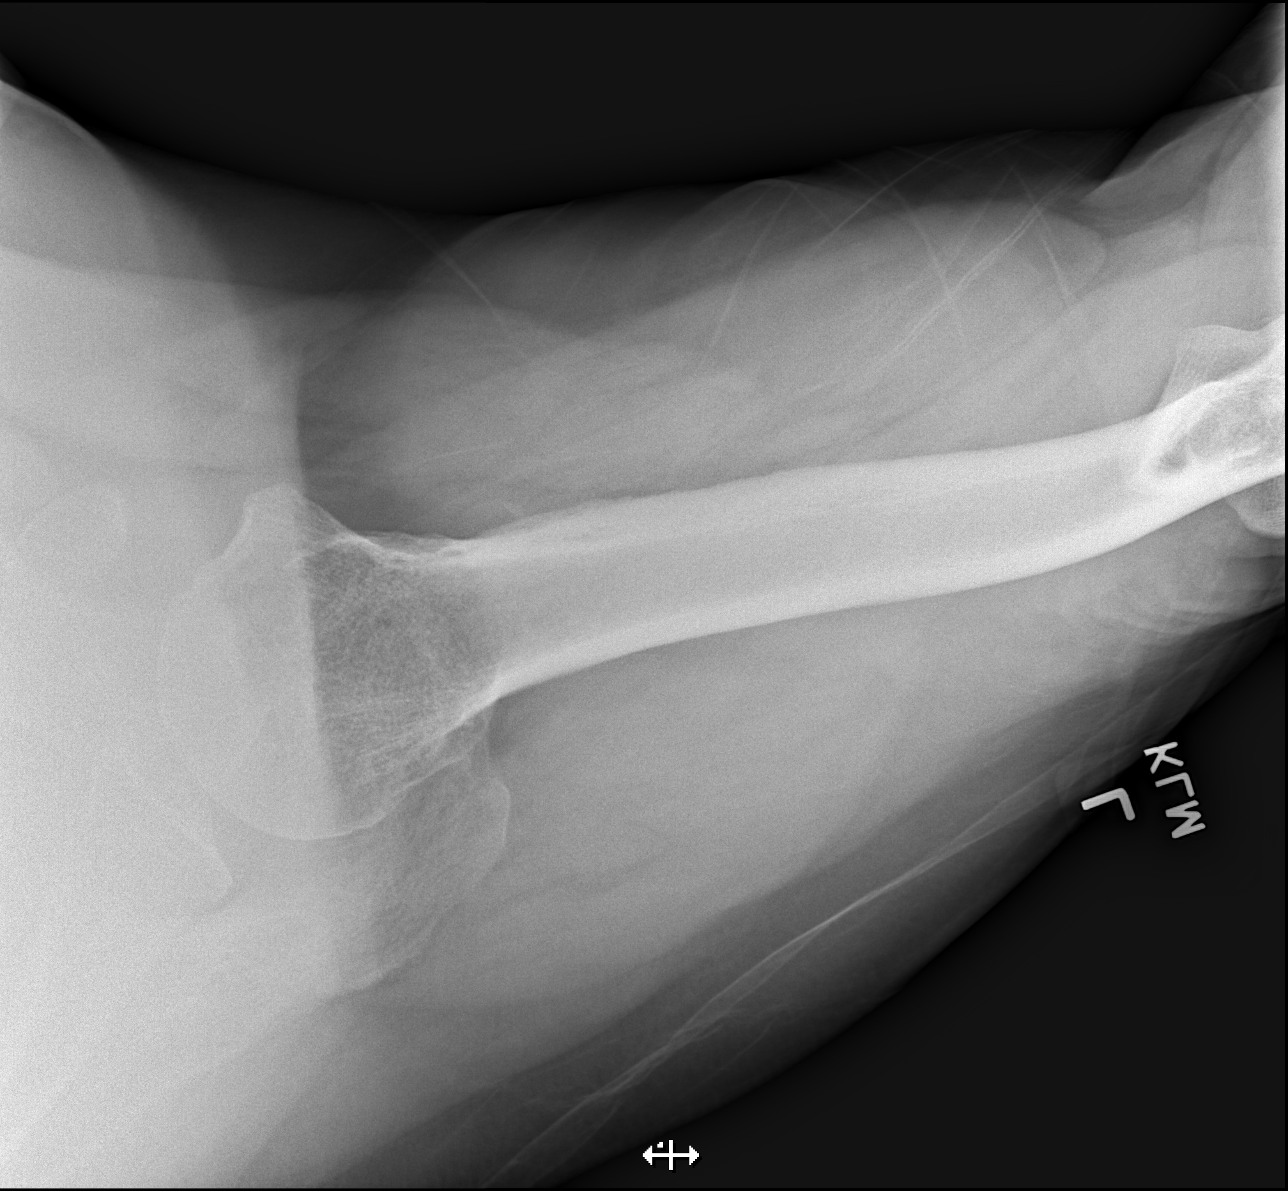

[3 of 3 positions shown; findings below may reference images not displayed]

FINDINGS: There is normal bony alignment.

No evidence of acute osseous or articular abnormality.

The joint spaces are maintained.
IMPRESSION: No evidence of acute osseous or articular abnormality. The joint
spaces are maintained.

## 2023-07-19 NOTE — Progress Notes (Signed)
Assessment and Plan:  Luis Bonilla was seen today for follow-up.  Diagnoses and all orders for this visit:  Hypotension, unspecified hypotension type Continune midodrine 5 mg BID  BP has been in 130's at home consistently , will stop florinef and continue midodrine Monitor weight and BP daily and keep log Follow up in 4 months Go to the ER if any chest pain, shortness of breath, nausea, dizziness, severe HA, changes vision/speech  -     CBC - CMP  Type 2 diabetes mellitus with stage 2 chronic kidney disease, without long-term current use of insulin (HCC) Continue Mounjaro 7.5 mg QW If A1c remains less than 5.6 will stop Metformin per patient request Continue to monitor BS daily - A1c  Hyperlipidemia associated with type 2 diabetes mellitus (HCC) Continue low saturated fat diet, exercise -     Lipid panel  CKD stage 2 due to type 2 diabetes mellitus (HCC) Increase fluids, avoid NSAIDS, monitor sugars, will monitor -     CBC with Differential/Platelet -     COMPLETE METABOLIC PANEL WITH GFR  Chronic obstructive pulmonary disease, unspecified COPD type (HCC) Strongly encouraged to stop smoking Continue Breo daily and albuterol inhaler as needed  Aortic atherosclerosis (HCC) Control BP, weight, Cholesterol and blood sugars  Tobacco use disorder Strongly encouraged to stop smoking- pt states he does not have a desire to quit and counseled on risks of smoking  Medication management -     CBC with Differential/Platelet -     COMPLETE METABOLIC PANEL WITH GFR -     Lipid panel -     Hemoglobin A1C w/out eAG      Further disposition pending results of labs. Discussed med's effects and SE's.   Over 30 minutes of exam, counseling, chart review, and critical decision making was performed.   Future Appointments  Date Time Provider Department Center  02/06/2024  2:00 PM Raynelle Dick, NP [facility] None     ------------------------------------------------------------------------------------------------------------------   HPI BP 130/80   Pulse (!) 57   Temp (!) 97.3 F (36.3 C)   Ht 5' 7.5" (1.715 m)   Wt 186 lb 9.6 oz (84.6 kg)   SpO2 99%   BMI 28.79 kg/m   67 y.o.male presents for reevaluation of blood pressure . He is on Florinef 0.1 mg QAM and midodrine 5 mg BID 131/73 at home and this is average.  BP today: BP Readings from Last 3 Encounters:  07/23/23 130/80  04/11/23 124/70  03/28/23 110/62    BMI is Body mass index is 28.79 kg/m., he has not been working on diet and exercise. He is down 9 pounds in past 3 months.  Has lost over 80 pounds on [place],  Wt Readings from Last 3 Encounters:  07/23/23 186 lb 9.6 oz (84.6 kg)  04/11/23 190 lb 12.8 oz (86.5 kg)  03/28/23 197 lb 12.8 oz (89.7 kg)    Blood sugars are running 80-90's since taking only 1 metformin, Mounjaro 7.5 mg sq QW. Focusing on limiting simple carbs and is drinking more water.  Lab Results  Component Value Date   HGBA1C 5.6 02/06/2023    Lab Results  Component Value Date   EGFR 94 04/11/2023    Smoking 2 ppd, he doesn't want to quit.  He is counseled on dangers. Uses Breo inhaler daily and albuterol inhaler as needed.  Morning cough but otherwise denies respiratory symptoms. CT chest lung nodule follow up 03/27/23: 1. Lung-RADS 2, benign appearance or behavior. Continue annual  screening with low-dose chest CT without contrast in 12 months. 2. Tiny bilateral pleural effusions. 3. 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 4. Cholelithiasis. 5. Aortic atherosclerosis (ICD10-I70.0). Coronary artery calcification. 6.  Emphysema (ICD10-J43.9).  He is currently on Rosuvastatin 5 mg QD Lab Results  Component Value Date   CHOL 100 02/06/2023   HDL 38 (L) 02/06/2023   LDLCALC 46 02/06/2023   TRIG 75 02/06/2023    CHOLHDL 2.6 02/06/2023      Past Medical History:  Diagnosis Date   Arthritis    LEFT shoulder/RIGHT hand   Asthma    uses inhaler   COPD (chronic obstructive pulmonary disease) (HCC)    stage 1   Erectile dysfunction    Hyperlipidemia    on meds   Hypertension    on meds   Other testicular hypofunction    Sleep apnea    uses CPAP nightly   Type II or unspecified type diabetes mellitus without mention of complication, not stated as uncontrolled    on meds     Allergies  Allergen Reactions   Flaxseed (Linseed)     Other reaction(s): Other Bad after taste.   Flax [Flax Seed Oil]     Current Outpatient Medications on File Prior to Visit  Medication Sig   albuterol (VENTOLIN HFA) 108 (90 Base) MCG/ACT inhaler USE 2 INHALATIONS BY MOUTH 15  MINUTES APART EVERY 4 HOURS AS  NEEDED TO RESCUE   BREO ELLIPTA 100-25 MCG/ACT AEPB USE 1 INHALATION BY MOUTH DAILY   fludrocortisone (FLORINEF) 0.1 MG tablet TAKE 1 TABLET BY MOUTH 2 TIMES DAILY.   glucose blood test strip Check blood sugars fasting daily   metFORMIN (GLUCOPHAGE-XR) 500 MG 24 hr tablet Take 2 tabs with meal once a day   midodrine (PROAMATINE) 5 MG tablet TAKE 1 TABLET BY MOUTH 2 TIMES DAILY WITH A MEAL.   rosuvastatin (CRESTOR) 5 MG tablet Take 1 tablet (5 mg total) by mouth daily.   tadalafil (CIALIS) 20 MG tablet TAKE 1/2 TO 1 TABLET EVERY 2 TO 3 DAYS IF NEEDED   tirzepatide (MOUNJARO) 7.5 MG/0.5ML Pen INJECT 7.5 MG SUBCUTANEOUSLY WEEKLY   Vitamin D, Ergocalciferol, (DRISDOL) 1.25 MG (50000 UNIT) CAPS capsule 1 capsule once a week for Vitamin D Deficiency   losartan (COZAAR) 50 MG tablet TAKE 1 TABLET BY MOUTH EVERY DAY FOR BLOOD PRESSURE (Patient not taking: Reported on 03/07/2023)   No current facility-administered medications on file prior to visit.    ROS: all negative except above.   Physical Exam:  BP 130/80   Pulse (!) 57   Temp (!) 97.3 F (36.3 C)   Ht 5' 7.5" (1.715 m)   Wt 186 lb 9.6 oz (84.6 kg)    SpO2 99%   BMI 28.79 kg/m   General Appearance: Well nourished, in no apparent distress. Eyes: PERRLA, EOMs, conjunctiva no swelling or erythema Respiratory: Respiratory effort normal, BS equal bilaterally without rales, rhonchi, wheezing or stridor.  Cardio: RRR with no MRGs. Brisk peripheral pulses without edema Abdomen: Soft, + BS.  Non tender, no guarding, rebound, hernias, masses. Lymphatics: Non tender without lymphadenopathy.  Musculoskeletal: Full ROM, 5/5 strength, normal gait.  Skin: Warm, dry without rashes, lesions, ecchymosis.  Neuro: Cranial nerves intact. Normal muscle tone, no cerebellar symptoms. Sensation intact.  Psych: Awake and oriented X 3, normal affect, Insight and Judgment appropriate.     Raynelle Dick, NP 11:55 AM [facility]

## 2023-07-23 ENCOUNTER — Ambulatory Visit (INDEPENDENT_AMBULATORY_CARE_PROVIDER_SITE_OTHER): Payer: 59 | Admitting: Nurse Practitioner

## 2023-07-23 ENCOUNTER — Encounter: Payer: Self-pay | Admitting: Nurse Practitioner

## 2023-07-23 VITALS — BP 130/80 | HR 57 | Temp 97.3°F | Ht 67.5 in | Wt 186.6 lb

## 2023-07-23 DIAGNOSIS — E1122 Type 2 diabetes mellitus with diabetic chronic kidney disease: Secondary | ICD-10-CM

## 2023-07-23 DIAGNOSIS — F17200 Nicotine dependence, unspecified, uncomplicated: Secondary | ICD-10-CM

## 2023-07-23 DIAGNOSIS — E785 Hyperlipidemia, unspecified: Secondary | ICD-10-CM

## 2023-07-23 DIAGNOSIS — J449 Chronic obstructive pulmonary disease, unspecified: Secondary | ICD-10-CM

## 2023-07-23 DIAGNOSIS — I959 Hypotension, unspecified: Secondary | ICD-10-CM

## 2023-07-23 DIAGNOSIS — N182 Chronic kidney disease, stage 2 (mild): Secondary | ICD-10-CM

## 2023-07-23 DIAGNOSIS — Z79899 Other long term (current) drug therapy: Secondary | ICD-10-CM

## 2023-07-23 DIAGNOSIS — I700 Atherosclerosis of aorta: Secondary | ICD-10-CM

## 2023-07-23 DIAGNOSIS — E1169 Type 2 diabetes mellitus with other specified complication: Secondary | ICD-10-CM | POA: Diagnosis not present

## 2023-07-23 NOTE — Patient Instructions (Signed)
Hypotension As your heart beats, it forces blood through your body. This force is called blood pressure. If you have hypotension, you have low blood pressure.  When your blood pressure is too low, you may not get enough blood to your brain or other parts of your body. This may cause you to feel weak, light-headed, have a fast heartbeat, or even faint. Low blood pressure may be harmless, or it may cause serious problems. What are the causes? Blood loss. Not enough water in the body (dehydration). Heart problems. Hormone problems. Pregnancy. A very bad infection. Not having enough of certain nutrients. Very bad allergic reactions. Certain medicines. What increases the risk? Age. The risk increases as you get older. Conditions that affect the heart or the brain and spinal cord (central nervous system). What are the signs or symptoms? Feeling: Weak. Light-headed. Dizzy. Tired (fatigued). Blurred vision. Fast heartbeat. Fainting, in very bad cases. How is this treated? Changing your diet. This may involve drinking more water or including more salt (sodium) in your diet by eating high-salt foods. Taking medicines to raise your blood pressure. Changing how much you take (the dosage) of some of your medicines. Wearing compression stockings. These stockings help to prevent blood clots and reduce swelling in your legs. In some cases, you may need to go to the hospital to: Receive fluids through an IV tube. Receive donated blood through an IV tube (transfusion). Get treated for an infection or heart problems, if this applies. Be monitored while medicines that you are taking wear off. Follow these instructions at home: Eating and drinking  Drink enough fluids to keep your pee (urine) pale yellow. Eat a healthy diet. Follow instructions from your doctor about what you can eat or drink. A healthy diet includes: Fresh fruits and vegetables. Whole grains. Low-fat (lean) meats. Low-fat  dairy products. If told, include more salt in your diet. Do not add extra salt to your diet unless your doctor tells you to. Eat small meals often. Avoid standing up quickly after you eat. Medicines Take over-the-counter and prescription medicines only as told by your doctor. Follow instructions from your doctor about changing how much you take of your medicines, if this applies. Do not stop or change any of your medicines on your own. General instructions  Wear compression stockings as told by your doctor. Get up slowly from lying down or sitting. Avoid hot showers and a lot of heat as told by your doctor. Return to your normal activities when your doctor says that it is safe. Do not smoke or use any products that contain nicotine or tobacco. If you need help quitting, ask your doctor. Keep all follow-up visits. Contact a doctor if: You vomit. You have watery poop (diarrhea). You have a fever for more than 2-3 days. You feel more thirsty than normal. You feel weak and tired. Get help right away if: You have chest pain. You have a fast or uneven heartbeat. You lose feeling (have numbness) in any part of your body. You cannot move your arms or your legs. You have trouble talking. You get sweaty or feel light-headed. You faint. You have trouble breathing. You have trouble staying awake. You feel mixed up (confused). These symptoms may be an emergency. Get help right away. Call 911. Do not wait to see if the symptoms will go away. Do not drive yourself to the hospital. Summary Hypotension is also called low blood pressure. It is when the force of blood pumping through your body   is too weak. Hypotension may be harmless, or it may cause serious problems. Treatment may include changing your diet and medicines, and wearing compression stockings. In very bad cases, you may need to go to the hospital. This information is not intended to replace advice given to you by your health care  provider. Make sure you discuss any questions you have with your health care provider. Document Revised: 05/30/2021 Document Reviewed: 05/30/2021 Elsevier Patient Education  2024 [employer]  

## 2023-07-24 ENCOUNTER — Ambulatory Visit: Payer: 59 | Admitting: Nurse Practitioner

## 2023-07-24 LAB — CBC WITH DIFFERENTIAL/PLATELET
Absolute Monocytes: 435 {cells}/uL (ref 200–950)
Basophils Absolute: 17 {cells}/uL (ref 0–200)
Basophils Relative: 0.3 %
Eosinophils Absolute: 81 {cells}/uL (ref 15–500)
Eosinophils Relative: 1.4 %
HCT: 45.2 % (ref 38.5–50.0)
Hemoglobin: 14.8 g/dL (ref 13.2–17.1)
Lymphs Abs: 1914 {cells}/uL (ref 850–3900)
MCH: 28.9 pg (ref 27.0–33.0)
MCHC: 32.7 g/dL (ref 32.0–36.0)
MCV: 88.3 fL (ref 80.0–100.0)
MPV: 10.4 fL (ref 7.5–12.5)
Monocytes Relative: 7.5 %
Neutro Abs: 3352 {cells}/uL (ref 1500–7800)
Neutrophils Relative %: 57.8 %
Platelets: 158 10*3/uL (ref 140–400)
RBC: 5.12 10*6/uL (ref 4.20–5.80)
RDW: 13.4 % (ref 11.0–15.0)
Total Lymphocyte: 33 %
WBC: 5.8 10*3/uL (ref 3.8–10.8)

## 2023-07-24 LAB — LIPID PANEL
Cholesterol: 99 mg/dL (ref <200)
HDL: 42 mg/dL (ref >=40)
LDL Cholesterol (Calc): 43 mg/dL
Non-HDL Cholesterol (Calc): 57 mg/dL (ref <130)
Total CHOL/HDL Ratio: 2.4 (calc) (ref <5.0)
Triglycerides: 49 mg/dL (ref <150)

## 2023-07-24 LAB — COMPLETE METABOLIC PANEL WITH GFR
AG Ratio: 1.6 (calc) (ref 1.0–2.5)
ALT: 8 U/L — ABNORMAL LOW (ref 9–46)
AST: 12 U/L (ref 10–35)
Albumin: 4.4 g/dL (ref 3.6–5.1)
Alkaline phosphatase (APISO): 84 U/L (ref 35–144)
BUN: 12 mg/dL (ref 7–25)
CO2: 29 mmol/L (ref 20–32)
Calcium: 9.2 mg/dL (ref 8.6–10.3)
Chloride: 102 mmol/L (ref 98–110)
Creat: 0.88 mg/dL (ref 0.70–1.35)
Globulin: 2.8 g/dL (ref 1.9–3.7)
Glucose, Bld: 90 mg/dL (ref 65–99)
Potassium: 4 mmol/L (ref 3.5–5.3)
Sodium: 140 mmol/L (ref 135–146)
Total Bilirubin: 0.9 mg/dL (ref 0.2–1.2)
Total Protein: 7.2 g/dL (ref 6.1–8.1)
eGFR: 95 mL/min/{1.73_m2} (ref >=60)

## 2023-07-24 LAB — HEMOGLOBIN A1C W/OUT EAG: Hgb A1c MFr Bld: 5.7 %{Hb} — ABNORMAL HIGH (ref <5.7)

## 2023-07-25 ENCOUNTER — Other Ambulatory Visit: Payer: Self-pay | Admitting: Nurse Practitioner

## 2023-07-25 DIAGNOSIS — E1169 Type 2 diabetes mellitus with other specified complication: Secondary | ICD-10-CM

## 2023-07-30 ENCOUNTER — Encounter: Payer: Self-pay | Admitting: Nurse Practitioner

## 2023-07-31 ENCOUNTER — Other Ambulatory Visit: Payer: Self-pay | Admitting: Nurse Practitioner

## 2023-07-31 DIAGNOSIS — E1122 Type 2 diabetes mellitus with diabetic chronic kidney disease: Secondary | ICD-10-CM

## 2023-07-31 DIAGNOSIS — E1169 Type 2 diabetes mellitus with other specified complication: Secondary | ICD-10-CM

## 2023-07-31 MED ORDER — METFORMIN HCL ER 500 MG PO TB24
ORAL_TABLET | ORAL | 2 refills | Status: DC
Start: 2023-07-31 — End: 2024-07-14

## 2023-07-31 MED ORDER — ROSUVASTATIN CALCIUM 5 MG PO TABS
5.0000 mg | ORAL_TABLET | Freq: Every day | ORAL | 3 refills | Status: DC
Start: 2023-07-31 — End: 2024-05-29

## 2023-08-08 ENCOUNTER — Other Ambulatory Visit: Payer: Self-pay | Admitting: Nurse Practitioner

## 2023-08-13 ENCOUNTER — Other Ambulatory Visit: Payer: Self-pay | Admitting: Nurse Practitioner

## 2023-08-13 DIAGNOSIS — E1122 Type 2 diabetes mellitus with diabetic chronic kidney disease: Secondary | ICD-10-CM

## 2023-08-13 DIAGNOSIS — E1169 Type 2 diabetes mellitus with other specified complication: Secondary | ICD-10-CM

## 2023-10-26 NOTE — Progress Notes (Deleted)
 Assessment and Plan:  Luis Bonilla was seen today for follow-up.  Diagnoses and all orders for this visit:  Hypotension, unspecified hypotension type Continune midodrine  5 mg BID  BP has been in 130's at home consistently , will stop florinef  and continue midodrine  Monitor weight and BP daily and keep log Follow up in 4 months Go to the ER if any chest pain, shortness of breath, nausea, dizziness, severe HA, changes vision/speech  -     CBC - CMP  Type 2 diabetes mellitus with stage 2 chronic kidney disease, without long-term current use of insulin  (HCC) Continue Mounjaro  7.5 mg QW If A1c remains less than 5.6 will stop Metformin  per patient request Continue to monitor BS daily - A1c  Hyperlipidemia associated with type 2 diabetes mellitus (HCC) Continue low saturated fat diet, exercise -     Lipid panel  CKD stage 2 due to type 2 diabetes mellitus (HCC) Increase fluids, avoid NSAIDS, monitor sugars, will monitor -     CBC with Differential/Platelet -     COMPLETE METABOLIC PANEL WITH GFR  Chronic obstructive pulmonary disease, unspecified COPD type (HCC) Strongly encouraged to stop smoking Continue Breo daily and albuterol  inhaler as needed  Aortic atherosclerosis (HCC) Control BP, weight, Cholesterol and blood sugars  Tobacco use disorder Strongly encouraged to stop smoking- pt states he does not have a desire to quit and counseled on risks of smoking  Medication management -     CBC with Differential/Platelet -     COMPLETE METABOLIC PANEL WITH GFR -     Lipid panel -     Hemoglobin A1C w/out eAG      Further disposition pending results of labs. Discussed med's effects and SE's.   Over 30 minutes of exam, counseling, chart review, and critical decision making was performed.   Future Appointments  Date Time Provider Department Center  10/29/2023  9:30 AM [name] E, NP [facility] None  02/11/2024  2:00 PM [name] E, NP [facility] None     ------------------------------------------------------------------------------------------------------------------   HPI There were no vitals taken for this visit.  68 y.o.male presents for reevaluation of blood pressure . He is on Florinef  0.1 mg QAM and midodrine  5 mg BID 131/73 at home and this is average.  BP today: BP Readings from Last 3 Encounters:  07/23/23 130/80  04/11/23 124/70  03/28/23 110/62    BMI is There is no height or weight on file to calculate BMI., he has not been working on diet and exercise. He is down 9 pounds in past 3 months.  Has lost over 80 pounds on Mounjaro ,  Wt Readings from Last 3 Encounters:  07/23/23 186 lb 9.6 oz (84.6 kg)  04/11/23 190 lb 12.8 oz (86.5 kg)  03/28/23 197 lb 12.8 oz (89.7 kg)    Blood sugars are running 80-90's since taking only 1 metformin , Mounjaro  7.5 mg sq QW. Focusing on limiting simple carbs and is drinking more water.  Lab Results  Component Value Date   HGBA1C 5.7 (H) 07/23/2023    Lab Results  Component Value Date   EGFR 95 07/23/2023    Smoking 2 ppd, he doesn't want to quit.  He is counseled on dangers. Uses Breo inhaler daily and albuterol  inhaler as needed.  Morning cough but otherwise denies respiratory symptoms. CT chest lung nodule follow up 03/27/23: 1. Lung-RADS 2, benign appearance or behavior. Continue annual screening with low-dose chest CT without contrast in 12 months. 2. Tiny bilateral pleural effusions. 3.  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 4. Cholelithiasis. 5. Aortic atherosclerosis (ICD10-I70.0). Coronary artery calcification. 6.  Emphysema (ICD10-J43.9).  He is currently on Rosuvastatin  5 mg QD Lab Results  Component Value Date   CHOL 99 07/23/2023   HDL 42 07/23/2023   LDLCALC 43 07/23/2023   TRIG 49 07/23/2023   CHOLHDL 2.4 07/23/2023      Past Medical History:  Diagnosis Date   Arthritis     LEFT shoulder/RIGHT hand   Asthma    uses inhaler   COPD (chronic obstructive pulmonary disease) (HCC)    stage 1   Erectile dysfunction    Hyperlipidemia    on meds   Hypertension    on meds   Other testicular hypofunction    Sleep apnea    uses CPAP nightly   Type II or unspecified type diabetes mellitus without mention of complication, not stated as uncontrolled    on meds     Allergies  Allergen Reactions   Flaxseed (Linseed)     Other reaction(s): Other Bad after taste.   Flax [Flax Seed Oil]     Current Outpatient Medications on File Prior to Visit  Medication Sig   albuterol  (VENTOLIN  HFA) 108 (90 Base) MCG/ACT inhaler USE 2 INHALATIONS BY MOUTH 15  MINUTES APART EVERY 4 HOURS AS  NEEDED TO RESCUE   BREO ELLIPTA  100-25 MCG/ACT AEPB USE 1 INHALATION BY MOUTH ONCE  DAILY AT THE SAME TIME EACH DAY   fludrocortisone  (FLORINEF ) 0.1 MG tablet TAKE 1 TABLET BY MOUTH 2 TIMES DAILY.   metFORMIN  (GLUCOPHAGE -XR) 500 MG 24 hr tablet Take 2 tabs with meal once a day   midodrine  (PROAMATINE ) 5 MG tablet TAKE 1 TABLET BY MOUTH 2 TIMES DAILY WITH A MEAL.   ONETOUCH ULTRA test strip CHECK BLOOD SUGARS FASTING DAILY   rosuvastatin  (CRESTOR ) 5 MG tablet Take 1 tablet (5 mg total) by mouth daily.   tadalafil  (CIALIS ) 20 MG tablet TAKE 1/2 TO 1 TABLET EVERY 2 TO 3 DAYS IF NEEDED   tirzepatide  (MOUNJARO ) 7.5 MG/0.5ML Pen INJECT 7.5 MG SUBCUTANEOUSLY WEEKLY   Vitamin D , Ergocalciferol , (DRISDOL ) 1.25 MG (50000 UNIT) CAPS capsule 1 capsule once a week for Vitamin D  Deficiency   No current facility-administered medications on file prior to visit.    ROS: all negative except above.   Physical Exam:  There were no vitals taken for this visit.  General Appearance: Well nourished, in no apparent distress. Eyes: PERRLA, EOMs, conjunctiva no swelling or erythema Respiratory: Respiratory effort normal, BS equal bilaterally without rales, rhonchi, wheezing or stridor.  Cardio: RRR with no  MRGs. Brisk peripheral pulses without edema Abdomen: Soft, + BS.  Non tender, no guarding, rebound, hernias, masses. Lymphatics: Non tender without lymphadenopathy.  Musculoskeletal: Full ROM, 5/5 strength, normal gait.  Skin: Warm, dry without rashes, lesions, ecchymosis.  Neuro: Cranial nerves intact. Normal muscle tone, no cerebellar symptoms. Sensation intact.  Psych: Awake and oriented X 3, normal affect, Insight and Judgment appropriate.     Zenon Leaf E Gennette Shadix, NP 9:25 AM [facility]

## 2023-10-29 ENCOUNTER — Encounter: Payer: Self-pay | Admitting: Nurse Practitioner

## 2023-10-29 ENCOUNTER — Ambulatory Visit: Payer: 59 | Admitting: Nurse Practitioner

## 2023-10-29 ENCOUNTER — Ambulatory Visit (INDEPENDENT_AMBULATORY_CARE_PROVIDER_SITE_OTHER): Payer: 59 | Admitting: Nurse Practitioner

## 2023-10-29 VITALS — BP 102/68 | HR 59 | Temp 97.9°F | Ht 67.5 in | Wt 193.0 lb

## 2023-10-29 DIAGNOSIS — F17200 Nicotine dependence, unspecified, uncomplicated: Secondary | ICD-10-CM

## 2023-10-29 DIAGNOSIS — E1169 Type 2 diabetes mellitus with other specified complication: Secondary | ICD-10-CM

## 2023-10-29 DIAGNOSIS — N182 Chronic kidney disease, stage 2 (mild): Secondary | ICD-10-CM

## 2023-10-29 DIAGNOSIS — I700 Atherosclerosis of aorta: Secondary | ICD-10-CM | POA: Diagnosis not present

## 2023-10-29 DIAGNOSIS — I959 Hypotension, unspecified: Secondary | ICD-10-CM

## 2023-10-29 DIAGNOSIS — J449 Chronic obstructive pulmonary disease, unspecified: Secondary | ICD-10-CM

## 2023-10-29 DIAGNOSIS — Z79899 Other long term (current) drug therapy: Secondary | ICD-10-CM

## 2023-10-29 DIAGNOSIS — E1122 Type 2 diabetes mellitus with diabetic chronic kidney disease: Secondary | ICD-10-CM

## 2023-10-29 DIAGNOSIS — E785 Hyperlipidemia, unspecified: Secondary | ICD-10-CM

## 2023-10-29 NOTE — Progress Notes (Signed)
 Assessment and Plan:  Tranquilino was seen today for follow-up.  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, without long-term current use of insulin  (HCC) Continue Mounjaro  7.5 mg QW If A1c remains less than 5.6 will stop Metformin  per patient request Continue to monitor BS daily - A1c  Hyperlipidemia associated with type 2 diabetes mellitus (HCC) Continue low saturated fat diet, exercise -     Lipid panel  CKD stage 2 due to type 2 diabetes mellitus (HCC) Increase fluids, avoid NSAIDS, monitor sugars, will monitor -     CBC with Differential/Platelet -     COMPLETE METABOLIC PANEL WITH GFR  Chronic obstructive pulmonary disease, unspecified COPD type (HCC) Strongly encouraged to stop smoking Continue Breo daily and albuterol  inhaler as needed  Aortic atherosclerosis (HCC) Control BP, weight, Cholesterol and blood sugars  Tobacco use disorder Strongly encouraged to stop smoking- pt states he does not have a desire to quit and counseled on risks of smoking  Medication management -     CBC with Differential/Platelet -     COMPLETE METABOLIC PANEL WITH GFR -     Lipid panel -     Hemoglobin A1C w/out eAG      Further disposition pending results of labs. Discussed med's effects and SE's.   Over 30 minutes of exam, counseling, chart review, and critical decision making was performed.   Future Appointments  Date Time Provider Department Center  02/11/2024  2:00 PM Jude Lonell BRAVO, NP [facility] None     ------------------------------------------------------------------------------------------------------------------   HPI BP 102/68   Pulse (!) 59   Temp 97.9 F (36.6 C)   Ht 5' 7.5 (1.715 m)   Wt 193 lb (87.5 kg)   SpO2 99%   BMI 29.78 kg/m   68 y.o.male presents for 3 month follow up . has Hypertension; Asthma; Hypogonadism in male; Morbid obesity (HCC); CKD stage 2 due to type 2 diabetes mellitus (HCC); Tobacco use disorder; Erectile dysfunction associated with type 2 diabetes mellitus (HCC); Vitamin D  deficiency; Medication management; COPD (chronic obstructive pulmonary disease) (HCC); Type 2 diabetes with stage 2 chronic kidney disease GFR 60-89 (HCC); Smoking greater than 40 pack years; Hyperlipidemia associated with type 2 diabetes mellitus (HCC); Lung cancer screening declined by patient; History of colonic polyps; Genetic testing; Aortic atherosclerosis (HCC); and Nodule of lower lobe of left lung on their problem list.   He is on midodrine  5 mg BID 131/73 at home and this is average.  BP today: BP Readings from Last 3 Encounters:  10/29/23 102/68  07/23/23 130/80  04/11/23 124/70    BMI is Body mass index is 29.78 kg/m., he has not been working on diet and exercise. Weight up a little from holidays. .  Has lost over 80 pounds on Mounjaro ,  Wt Readings from Last 3 Encounters:  10/29/23 193 lb (87.5 kg)  07/23/23 186 lb 9.6 oz (84.6 kg)  04/11/23 190 lb 12.8 oz (86.5 kg)    Blood sugars are running 90's-100 since taking only 1 metformin , Mounjaro  7.5 mg sq QW. Focusing on limiting simple carbs and  is drinking more water.  Lab Results  Component Value Date   HGBA1C 5.7 (H) 07/23/2023    He is not drinking as much water.  Lab Results  Component Value Date   EGFR 95 07/23/2023    Smoking 2 ppd, he doesn't want to quit.  He is counseled on dangers. Uses Breo inhaler daily and albuterol  inhaler as needed.  Morning cough but otherwise denies respiratory symptoms.  CT chest lung nodule follow up 03/27/23: 1. Lung-RADS 2, benign appearance or behavior. Continue annual screening with low-dose chest CT without contrast in 12 months. 2. Tiny bilateral pleural effusions. 3. 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 4. Cholelithiasis. 5. Aortic atherosclerosis (ICD10-I70.0). Coronary artery calcification. 6.  Emphysema (ICD10-J43.9).  He is currently on Rosuvastatin  5 mg every day for hyperlipidemia and LDL is at goal Lab Results  Component Value Date   CHOL 99 07/23/2023   HDL 42 07/23/2023   LDLCALC 43 07/23/2023   TRIG 49 07/23/2023   CHOLHDL 2.4 07/23/2023    Continues to smoke 2 packs a day. Still working as a naval architect.    Past Medical History:  Diagnosis Date   Arthritis    LEFT shoulder/RIGHT hand   Asthma    uses inhaler   COPD (chronic obstructive pulmonary disease) (HCC)    stage 1   Erectile dysfunction    Hyperlipidemia    on meds   Hypertension    on meds   Other testicular hypofunction    Sleep apnea    uses CPAP nightly   Type II or unspecified type diabetes mellitus without mention of complication, not stated as uncontrolled    on meds     Allergies  Allergen Reactions   Flaxseed (Linseed)     Other reaction(s): Other Bad after taste.   Flax [Flax Seed Oil]     Current Outpatient Medications on File Prior to Visit  Medication Sig   albuterol  (VENTOLIN  HFA) 108 (90 Base) MCG/ACT inhaler USE 2 INHALATIONS BY MOUTH 15  MINUTES APART EVERY 4 HOURS AS  NEEDED TO RESCUE   BREO ELLIPTA  100-25 MCG/ACT AEPB USE 1 INHALATION BY MOUTH ONCE  DAILY AT THE SAME TIME EACH DAY   fludrocortisone  (FLORINEF ) 0.1 MG tablet TAKE 1 TABLET BY MOUTH 2 TIMES DAILY.   metFORMIN  (GLUCOPHAGE -XR) 500 MG 24 hr tablet Take 2 tabs with meal once a day   midodrine  (PROAMATINE ) 5 MG tablet TAKE 1 TABLET BY MOUTH 2 TIMES DAILY WITH A MEAL.   ONETOUCH ULTRA test  strip CHECK BLOOD SUGARS FASTING DAILY   rosuvastatin  (CRESTOR ) 5 MG tablet Take 1 tablet (5 mg total) by mouth daily.   tadalafil  (CIALIS ) 20 MG tablet TAKE 1/2 TO 1 TABLET EVERY 2 TO 3 DAYS IF NEEDED   tirzepatide  (MOUNJARO ) 7.5 MG/0.5ML Pen INJECT 7.5 MG SUBCUTANEOUSLY WEEKLY   Vitamin D , Ergocalciferol , (DRISDOL ) 1.25 MG (50000 UNIT) CAPS capsule 1 capsule once a week for Vitamin D  Deficiency   No current facility-administered medications on file prior to visit.    ROS: all negative except above.   Physical Exam:  BP 102/68   Pulse (!) 59   Temp 97.9 F (36.6 C)   Ht 5' 7.5 (1.715 m)   Wt 193 lb (87.5 kg)   SpO2 99%   BMI 29.78 kg/m   General Appearance: Well nourished, in no apparent distress. Eyes: PERRLA, EOMs, conjunctiva no swelling or erythema Respiratory: Respiratory effort normal, BS  equal bilaterally without rales, rhonchi, wheezing or stridor.  Cardio: RRR with no MRGs. Brisk peripheral pulses without edema Abdomen: Soft, + BS.  Non tender, no guarding, rebound, hernias, masses. Lymphatics: Non tender without lymphadenopathy.  Musculoskeletal: Full ROM, 5/5 strength, normal gait.  Skin: Warm, dry without rashes, lesions, ecchymosis.  Neuro: Cranial nerves intact. Normal muscle tone, no cerebellar symptoms. Sensation intact.  Psych: Awake and oriented X 3, normal affect, Insight and Judgment appropriate.     Jacqulyne Gladue E Mattox Schorr, NP 1:49 PM [facility]

## 2023-10-29 NOTE — Patient Instructions (Signed)

## 2023-10-30 LAB — CBC WITH DIFFERENTIAL/PLATELET
Absolute Lymphocytes: 2118 {cells}/uL (ref 850–3900)
Absolute Monocytes: 566 {cells}/uL (ref 200–950)
Basophils Absolute: 28 {cells}/uL (ref 0–200)
Basophils Relative: 0.4 %
Eosinophils Absolute: 90 {cells}/uL (ref 15–500)
Eosinophils Relative: 1.3 %
HCT: 43.6 % (ref 38.5–50.0)
Hemoglobin: 14.4 g/dL (ref 13.2–17.1)
MCH: 29 pg (ref 27.0–33.0)
MCHC: 33 g/dL (ref 32.0–36.0)
MCV: 87.7 fL (ref 80.0–100.0)
MPV: 10.3 fL (ref 7.5–12.5)
Monocytes Relative: 8.2 %
Neutro Abs: 4099 {cells}/uL (ref 1500–7800)
Neutrophils Relative %: 59.4 %
Platelets: 159 10*3/uL (ref 140–400)
RBC: 4.97 10*6/uL (ref 4.20–5.80)
RDW: 13 % (ref 11.0–15.0)
Total Lymphocyte: 30.7 %
WBC: 6.9 10*3/uL (ref 3.8–10.8)

## 2023-10-30 LAB — COMPLETE METABOLIC PANEL WITH GFR
AG Ratio: 1.5 (calc) (ref 1.0–2.5)
ALT: 10 U/L (ref 9–46)
AST: 14 U/L (ref 10–35)
Albumin: 4.4 g/dL (ref 3.6–5.1)
Alkaline phosphatase (APISO): 76 U/L (ref 35–144)
BUN: 15 mg/dL (ref 7–25)
CO2: 24 mmol/L (ref 20–32)
Calcium: 9.6 mg/dL (ref 8.6–10.3)
Chloride: 104 mmol/L (ref 98–110)
Creat: 1.01 mg/dL (ref 0.70–1.35)
Globulin: 3 g/dL (ref 1.9–3.7)
Glucose, Bld: 91 mg/dL (ref 65–99)
Potassium: 5 mmol/L (ref 3.5–5.3)
Sodium: 140 mmol/L (ref 135–146)
Total Bilirubin: 0.6 mg/dL (ref 0.2–1.2)
Total Protein: 7.4 g/dL (ref 6.1–8.1)
eGFR: 83 mL/min/{1.73_m2} (ref >=60)

## 2023-10-30 LAB — LIPID PANEL
Cholesterol: 120 mg/dL (ref <200)
HDL: 44 mg/dL (ref >=40)
LDL Cholesterol (Calc): 62 mg/dL
Non-HDL Cholesterol (Calc): 76 mg/dL (ref <130)
Total CHOL/HDL Ratio: 2.7 (calc) (ref <5.0)
Triglycerides: 67 mg/dL (ref <150)

## 2023-10-30 LAB — HEMOGLOBIN A1C W/OUT EAG: Hgb A1c MFr Bld: 5.9 %{Hb} — ABNORMAL HIGH (ref <5.7)

## 2023-10-31 ENCOUNTER — Other Ambulatory Visit: Payer: Self-pay | Admitting: Nurse Practitioner

## 2023-10-31 DIAGNOSIS — E1122 Type 2 diabetes mellitus with diabetic chronic kidney disease: Secondary | ICD-10-CM

## 2023-11-08 ENCOUNTER — Other Ambulatory Visit: Payer: Self-pay | Admitting: Nurse Practitioner

## 2023-11-08 DIAGNOSIS — I959 Hypotension, unspecified: Secondary | ICD-10-CM

## 2023-11-08 DIAGNOSIS — Z79899 Other long term (current) drug therapy: Secondary | ICD-10-CM

## 2024-02-06 ENCOUNTER — Encounter: Payer: 59 | Admitting: Nurse Practitioner

## 2024-02-11 ENCOUNTER — Ambulatory Visit: Payer: 59 | Admitting: Family Medicine

## 2024-02-11 ENCOUNTER — Encounter: Payer: 59 | Admitting: Nurse Practitioner

## 2024-03-24 ENCOUNTER — Ambulatory Visit: Admitting: Family Medicine

## 2024-03-24 ENCOUNTER — Encounter: Payer: Self-pay | Admitting: Family Medicine

## 2024-03-24 VITALS — BP 122/88 | HR 85 | Temp 97.6°F | Ht 67.5 in | Wt 204.6 lb

## 2024-03-24 DIAGNOSIS — N182 Chronic kidney disease, stage 2 (mild): Secondary | ICD-10-CM

## 2024-03-24 DIAGNOSIS — Z1159 Encounter for screening for other viral diseases: Secondary | ICD-10-CM

## 2024-03-24 DIAGNOSIS — I700 Atherosclerosis of aorta: Secondary | ICD-10-CM | POA: Diagnosis not present

## 2024-03-24 DIAGNOSIS — E785 Hyperlipidemia, unspecified: Secondary | ICD-10-CM

## 2024-03-24 DIAGNOSIS — Z23 Encounter for immunization: Secondary | ICD-10-CM | POA: Diagnosis not present

## 2024-03-24 DIAGNOSIS — Z114 Encounter for screening for human immunodeficiency virus [HIV]: Secondary | ICD-10-CM

## 2024-03-24 DIAGNOSIS — E1169 Type 2 diabetes mellitus with other specified complication: Secondary | ICD-10-CM

## 2024-03-24 DIAGNOSIS — I959 Hypotension, unspecified: Secondary | ICD-10-CM | POA: Insufficient documentation

## 2024-03-24 DIAGNOSIS — F17210 Nicotine dependence, cigarettes, uncomplicated: Secondary | ICD-10-CM

## 2024-03-24 DIAGNOSIS — E1122 Type 2 diabetes mellitus with diabetic chronic kidney disease: Secondary | ICD-10-CM

## 2024-03-24 DIAGNOSIS — J432 Centrilobular emphysema: Secondary | ICD-10-CM

## 2024-03-24 DIAGNOSIS — I499 Cardiac arrhythmia, unspecified: Secondary | ICD-10-CM

## 2024-03-24 DIAGNOSIS — Z0001 Encounter for general adult medical examination with abnormal findings: Secondary | ICD-10-CM | POA: Diagnosis not present

## 2024-03-24 DIAGNOSIS — Z0000 Encounter for general adult medical examination without abnormal findings: Secondary | ICD-10-CM

## 2024-03-24 DIAGNOSIS — Z125 Encounter for screening for malignant neoplasm of prostate: Secondary | ICD-10-CM

## 2024-03-24 NOTE — Assessment & Plan Note (Signed)
 Continue Metformin  and Mounjaro . Has tried Glimepiride  in past. A1c and uACR today. Foot exam today. Vaccines today. Retinal eye exam UTD. Recommend heart healthy diet such as Mediterranean diet with whole grains, fruits, vegetable, fish, lean meats, nuts, and olive oil. Limit salt. Encouraged moderate walking, 3-5 times/week for 30-50 minutes each session. Aim for at least 150 minutes.week. Goal should be pace of 3 miles/hours, or walking 1.5 miles in 30 minutes. Seek medical care for urinary frequency, extreme thirst, vision changes, lightheadedness, dizziness.  Follow up in 4 months

## 2024-03-24 NOTE — Assessment & Plan Note (Signed)
 BP well controlled. A1c and lipids today.

## 2024-03-24 NOTE — Assessment & Plan Note (Addendum)
 EKG NSR with PACs. Labs today

## 2024-03-24 NOTE — Assessment & Plan Note (Signed)
 Lipids today. Continue rosuvastatin . I recommend consuming a heart healthy diet such as Mediterranean diet or DASH diet with whole grains, fruits, vegetable, fish, lean meats, nuts, and olive oil. Limit sweets and processed foods. I also encourage moderate intensity exercise 150 minutes weekly. This is 3-5 times weekly for 30-50 minutes each session. Goal should be pace of 3 miles/hours, or walking 1.5 miles in 30 minutes. The ASCVD Risk score (Arnett DK, et al., 2019) failed to calculate for the following reasons:   The valid total cholesterol range is 130 to 320 mg/dL

## 2024-03-24 NOTE — Assessment & Plan Note (Signed)
 Continue Midodrine  5mg  BID and Florinef  0.1mg  (reduce to daily as ordered). Monitor BP at home and report readings to office. Discussed goal BP >100/60 and <130/80. Seek immediate medical care for chest pain, palpitations, vision changes, SOB, dizziness, lightheadedness.

## 2024-03-24 NOTE — Assessment & Plan Note (Addendum)
Today your medical history was reviewed and routine physical exam with labs was performed. Recommend 150 minutes of moderate intensity exercise weekly and consuming a well-balanced diet. Advised to stop smoking if a smoker, avoid smoking if a non-smoker, limit alcohol consumption to 1 drink per day for women and 2 drinks per day for men, and avoid illicit drug use. Counseled on the importance of sunscreen use. Counseled in mental health awareness and when to seek medical care. Vaccine maintenance discussed. Appropriate health maintenance items reviewed. Return to office in 1 year for annual physical exam.

## 2024-03-24 NOTE — Assessment & Plan Note (Signed)
 3-5 minute discussion regarding the harms of tobacco use, the benefits of cessation, and methods of cessation. Discussed that there are medication options to help with cessation, cannot use Chantix, has tried Wellbutrin . Does not desire to quit at this time. Provided printed education on steps to quit smoking.

## 2024-03-24 NOTE — Progress Notes (Signed)
 New Patient Office Visit  Subjective    Patient ID: Luis Bonilla, male    DOB: Aug 24, 1956  Age: 68 y.o. MRN: [id number]  CC:  Chief Complaint  Patient presents with   Establish Care    Order lung cancer screening.      HPI Umair Rosiles Barbato presents to establish care. Oriented to practice routines and expectations. Has been seeing PCP regularly, he is due for his CPE. PMH includes aortic atherosclerosis, HTN, hypotension, COPD, DM2, HLD, CKD2, obesity, ED, asthma, tobacco use, pulmonary nodule.  Hypotension: on Midodrine  5mg  BID, was previously on Florinef  and has restarted at home due to low BP readings with dizziness  DM2 with CKD: on Mounjaro  7.5mg  weekly, metformin  500 mg daily, staying hydrated and avoiding NSAIDs (has used Glimepride in past), not checking  COPD with current tobacco use: no desire to quit, counseled on risks, on Breo daily and Albuterol  PRN  HLD: well controlled on Rosuvastatin  5mg  daily       Outpatient Encounter Medications as of 03/24/2024  Medication Sig   albuterol  (VENTOLIN  HFA) 108 (90 Base) MCG/ACT inhaler USE 2 INHALATIONS BY MOUTH 15  MINUTES APART EVERY 4 HOURS AS  NEEDED TO RESCUE   BREO ELLIPTA  100-25 MCG/ACT AEPB USE 1 INHALATION BY MOUTH ONCE  DAILY AT THE SAME TIME EACH DAY   fludrocortisone  (FLORINEF ) 0.1 MG tablet Take 0.1 mg by mouth daily.   metFORMIN  (GLUCOPHAGE -XR) 500 MG 24 hr tablet Take 2 tabs with meal once a day   midodrine  (PROAMATINE ) 5 MG tablet TAKE 1 TABLET BY MOUTH TWICE A DAY WITH FOOD   MOUNJARO  7.5 MG/0.5ML Pen INJECT THE CONTENTS OF ONE PEN  SUBCUTANEOUSLY WEEKLY AS  DIRECTED   ONETOUCH ULTRA test strip CHECK BLOOD SUGARS FASTING DAILY   rosuvastatin  (CRESTOR ) 5 MG tablet Take 1 tablet (5 mg total) by mouth daily.   tadalafil  (CIALIS ) 20 MG tablet TAKE 1/2 TO 1 TABLET EVERY 2 TO 3 DAYS IF NEEDED   Vitamin D , Ergocalciferol , (DRISDOL ) 1.25 MG (50000 UNIT) CAPS capsule 1 capsule once a week for Vitamin D  Deficiency    No facility-administered encounter medications on file as of 03/24/2024.    Past Medical History:  Diagnosis Date   Allergy 1999   Arthritis    LEFT shoulder/RIGHT hand   Asthma    uses inhaler   Chronic kidney disease    COPD (chronic obstructive pulmonary disease) (HCC)    stage 1   Erectile dysfunction    Hyperlipidemia    on meds   Hypertension    on meds   Other testicular hypofunction    Sleep apnea    uses CPAP nightly   Type II or unspecified type diabetes mellitus without mention of complication, not stated as uncontrolled    on meds    Past Surgical History:  Procedure Laterality Date   DENTAL SURGERY     VASECTOMY      Family History  Problem Relation Age of Onset   Stroke Mother    Hypertension Mother    Diabetes Mother    Lung cancer Father 13       smoked   Throat cancer Maternal Grandmother 31       smoked   Colon polyps Neg Hx    Colon cancer Neg Hx    Esophageal cancer Neg Hx    Stomach cancer Neg Hx    Rectal cancer Neg Hx     Social History   Socioeconomic History  Marital status: Married    Spouse name: Not on file   Number of children: Not on file   Years of education: Not on file   Highest education level: 7th grade  Occupational History   Not on file  Tobacco Use   Smoking status: Every Day    Current packs/day: 2.00    Average packs/day: 2.0 packs/day for 24.4 years (48.8 ttl pk-yrs)    Types: Cigarettes    Start date: 10/24/1999   Smokeless tobacco: Never  Vaping Use   Vaping status: Never Used  Substance and Sexual Activity   Alcohol use: Yes    Alcohol/week: 1.0 - 2.0 standard drink of alcohol    Types: 1 - 2 Cans of beer per week    Comment: occ   Drug use: No   Sexual activity: Yes  Other Topics Concern   Not on file  Social History Narrative   Not on file   Social Drivers of Health   Financial Resource Strain: Low Risk  (03/17/2024)   Overall Financial Resource Strain (CARDIA)    Difficulty of Paying  Living Expenses: Not hard at all  Food Insecurity: No Food Insecurity (03/17/2024)   Hunger Vital Sign    Worried About Running Out of Food in the Last Year: Never true    Ran Out of Food in the Last Year: Never true  Transportation Needs: No Transportation Needs (03/17/2024)   PRAPARE - Administrator, Civil Service (Medical): No    Lack of Transportation (Non-Medical): No  Physical Activity: Unknown (03/17/2024)   Exercise Vital Sign    Days of Exercise per Week: 0 days    Minutes of Exercise per Session: Not on file  Stress: Stress Concern Present (03/17/2024)   [employer] of Occupational Health - Occupational Stress Questionnaire    Feeling of Stress : To some extent  Social Connections: Unknown (03/17/2024)   Social Connection and Isolation Panel [NHANES]    Frequency of Communication with Friends and Family: More than three times a week    Frequency of Social Gatherings with Friends and Family: Once a week    Attends Religious Services: Patient declined    Database administrator or Organizations: No    Attends Engineer, structural: Not on file    Marital Status: Married  Intimate Partner Violence: Unknown (01/25/2022)   Received from [employer]   HITS    Physically Hurt: Not on file    Insult or Talk Down To: Not on file    Threaten Physical Harm: Not on file    Scream or Curse: Not on file    Review of Systems  Constitutional: Negative.   HENT: Negative.    Eyes: Negative.   Respiratory: Negative.    Cardiovascular: Negative.   Gastrointestinal: Negative.   Genitourinary: Negative.   Musculoskeletal: Negative.   Skin: Negative.   Neurological: Negative.   Endo/Heme/Allergies: Negative.   Psychiatric/Behavioral: Negative.    All other systems reviewed and are negative.       Objective    BP 122/88   Pulse 85   Temp 97.6 F (36.4 C)   Ht 5' 7.5" (1.715 m)   Wt 204 lb 9.6 oz (92.8 kg)   SpO2 99%   BMI 31.57 kg/m    Physical Exam Vitals and nursing note reviewed.  Constitutional:      Appearance: Normal appearance. He is normal weight.  HENT:     Head: Normocephalic and  atraumatic.     Right Ear: Tympanic membrane, ear canal and external ear normal.     Left Ear: Tympanic membrane, ear canal and external ear normal.     Nose: Nose normal.     Mouth/Throat:     Mouth: Mucous membranes are moist.     Dentition: Has dentures.     Pharynx: Oropharynx is clear.  Eyes:     Extraocular Movements: Extraocular movements intact.     Right eye: Normal extraocular motion and no nystagmus.     Left eye: Normal extraocular motion and no nystagmus.     Conjunctiva/sclera: Conjunctivae normal.     Pupils: Pupils are equal, round, and reactive to light.  Cardiovascular:     Rate and Rhythm: Normal rate. Rhythm irregular.     Pulses: Normal pulses.     Heart sounds: Normal heart sounds.  Pulmonary:     Effort: Pulmonary effort is normal.     Breath sounds: Normal breath sounds.  Abdominal:     General: Bowel sounds are normal.     Palpations: Abdomen is soft.  Genitourinary:    Comments: Deferred using shared decision making Musculoskeletal:        General: Normal range of motion.     Cervical back: Normal range of motion and neck supple.  Skin:    General: Skin is warm and dry.     Capillary Refill: Capillary refill takes less than 2 seconds.  Neurological:     General: No focal deficit present.     Mental Status: He is alert. Mental status is at baseline.  Psychiatric:        Mood and Affect: Mood normal.        Speech: Speech normal.        Behavior: Behavior normal.        Thought Content: Thought content normal.        Cognition and Memory: Cognition and memory normal.        Judgment: Judgment normal.    Diabetic Foot Exam - Simple   Simple Foot Form Diabetic Foot exam was performed with the following findings: Yes 03/24/2024  2:51 PM  Visual Inspection No deformities, no ulcerations,  no other skin breakdown bilaterally: Yes Sensation Testing Intact to touch and monofilament testing bilaterally: Yes Pulse Check Posterior Tibialis and Dorsalis pulse intact bilaterally: Yes Comments           Assessment & Plan:   Problem List Items Addressed This Visit     CKD stage 2 due to type 2 diabetes mellitus (HCC)   CMP today. Stay hydrated and monitor sodium intake. Avoid NSAIDs      COPD (chronic obstructive pulmonary disease) (HCC)   Well controlled on Breo and PRN Albuterol . Counseled on importance of smoking cessation. Referral to lung cancer screening placed.      Relevant Medications   fludrocortisone  (FLORINEF ) 0.1 MG tablet   Type 2 diabetes with stage 2 chronic kidney disease GFR 60-89 (HCC)   Continue Metformin  and Mounjaro . Has tried Glimepiride  in past. A1c and uACR today. Foot exam today. Vaccines today. Retinal eye exam UTD. Recommend heart healthy diet such as Mediterranean diet with whole grains, fruits, vegetable, fish, lean meats, nuts, and olive oil. Limit salt. Encouraged moderate walking, 3-5 times/week for 30-50 minutes each session. Aim for at least 150 minutes.week. Goal should be pace of 3 miles/hours, or walking 1.5 miles in 30 minutes. Seek medical care for urinary frequency, extreme thirst, vision changes, lightheadedness,  dizziness.  Follow up in 4 months      Relevant Orders   CBC with Differential/Platelet   Comprehensive metabolic panel with GFR   Lipid panel   Vitamin B12   VITAMIN D  25 Hydroxy (Vit-D Deficiency, Fractures)   Hemoglobin A1c   PSA   Hepatitis C antibody   HIV Antibody (routine testing w rflx)   Microalbumin / creatinine urine ratio   Smoking greater than 40 pack years   3-5 minute discussion regarding the harms of tobacco use, the benefits of cessation, and methods of cessation. Discussed that there are medication options to help with cessation, cannot use Chantix, has tried Wellbutrin . Does not desire to quit at  this time. Provided printed education on steps to quit smoking.       Relevant Orders   Ambulatory Referral for Lung Cancer Scre   Hyperlipidemia associated with type 2 diabetes mellitus (HCC)   Lipids today. Continue rosuvastatin . I recommend consuming a heart healthy diet such as Mediterranean diet or DASH diet with whole grains, fruits, vegetable, fish, lean meats, nuts, and olive oil. Limit sweets and processed foods. I also encourage moderate intensity exercise 150 minutes weekly. This is 3-5 times weekly for 30-50 minutes each session. Goal should be pace of 3 miles/hours, or walking 1.5 miles in 30 minutes. The ASCVD Risk score (Arnett DK, et al., 2019) failed to calculate for the following reasons:   The valid total cholesterol range is 130 to 320 mg/dL       Aortic atherosclerosis (HCC)   BP well controlled. A1c and lipids today.      Physical exam, annual - Primary   Today your medical history was reviewed and routine physical exam with labs was performed. Recommend 150 minutes of moderate intensity exercise weekly and consuming a well-balanced diet. Advised to stop smoking if a smoker, avoid smoking if a non-smoker, limit alcohol consumption to 1 drink per day for women and 2 drinks per day for men, and avoid illicit drug use.  Counseled on the importance of sunscreen use. Counseled in mental health awareness and when to seek medical care. Vaccine maintenance discussed. Appropriate health maintenance items reviewed. Return to office in 1 year for annual physical exam.       Relevant Orders   CBC with Differential/Platelet   Comprehensive metabolic panel with GFR   Lipid panel   Vitamin B12   VITAMIN D  25 Hydroxy (Vit-D Deficiency, Fractures)   Hemoglobin A1c   PSA   Hepatitis C antibody   HIV Antibody (routine testing w rflx)   Microalbumin / creatinine urine ratio   Ambulatory Referral for Lung Cancer Scre   Irregular heart beat   EKG NSR with PACs. Labs today       Relevant Orders   EKG 12-Lead   Hypotension   Continue Midodrine  5mg  BID and Florinef  0.1mg  (reduce to daily as ordered). Monitor BP at home and report readings to office. Discussed goal BP >100/60 and <130/80. Seek immediate medical care for chest pain, palpitations, vision changes, SOB, dizziness, lightheadedness.      Other Visit Diagnoses       Prostate cancer screening       Relevant Orders   PSA     Screening for HIV (human immunodeficiency virus)       Relevant Orders   HIV Antibody (routine testing w rflx)     Need for hepatitis C screening test       Relevant Orders   Hepatitis C  antibody       Return in about 16 weeks (around 07/14/2024) for chronic follow-up with labs 1 week prior and initial AWV.   Jenelle Mis, FNP

## 2024-03-24 NOTE — Assessment & Plan Note (Signed)
 Well controlled on Breo and PRN Albuterol . Counseled on importance of smoking cessation. Referral to lung cancer screening placed.

## 2024-03-24 NOTE — Assessment & Plan Note (Signed)
 CMP today. Stay hydrated and monitor sodium intake. Avoid NSAIDs

## 2024-03-24 NOTE — Patient Instructions (Signed)
 It was great to meet you today and I'm excited to have you join the [facility] practice. I hope you had a positive experience today! If you feel so inclined, please feel free to recommend our practice to friends and family. Kurtis Bushman, FNP-C

## 2024-03-25 ENCOUNTER — Ambulatory Visit: Payer: Self-pay | Admitting: Family Medicine

## 2024-03-25 LAB — COMPREHENSIVE METABOLIC PANEL WITH GFR
AG Ratio: 1.7 (calc) (ref 1.0–2.5)
ALT: 11 U/L (ref 9–46)
AST: 14 U/L (ref 10–35)
Albumin: 4.3 g/dL (ref 3.6–5.1)
Alkaline phosphatase (APISO): 67 U/L (ref 35–144)
BUN: 15 mg/dL (ref 7–25)
CO2: 28 mmol/L (ref 20–32)
Calcium: 9.1 mg/dL (ref 8.6–10.3)
Chloride: 103 mmol/L (ref 98–110)
Creat: 0.88 mg/dL (ref 0.70–1.35)
Globulin: 2.6 g/dL (ref 1.9–3.7)
Glucose, Bld: 82 mg/dL (ref 65–99)
Potassium: 3.8 mmol/L (ref 3.5–5.3)
Sodium: 140 mmol/L (ref 135–146)
Total Bilirubin: 0.6 mg/dL (ref 0.2–1.2)
Total Protein: 6.9 g/dL (ref 6.1–8.1)
eGFR: 95 mL/min/{1.73_m2} (ref >=60)

## 2024-03-25 LAB — CBC WITH DIFFERENTIAL/PLATELET
Absolute Lymphocytes: 2016 {cells}/uL (ref 850–3900)
Absolute Monocytes: 616 {cells}/uL (ref 200–950)
Basophils Absolute: 32 {cells}/uL (ref 0–200)
Basophils Relative: 0.4 %
Eosinophils Absolute: 80 {cells}/uL (ref 15–500)
Eosinophils Relative: 1 %
HCT: 40.6 % (ref 38.5–50.0)
Hemoglobin: 14.5 g/dL (ref 13.2–17.1)
MCH: 31.9 pg (ref 27.0–33.0)
MCHC: 35.7 g/dL (ref 32.0–36.0)
MCV: 89.4 fL (ref 80.0–100.0)
MPV: 9.6 fL (ref 7.5–12.5)
Monocytes Relative: 7.7 %
Neutro Abs: 5256 {cells}/uL (ref 1500–7800)
Neutrophils Relative %: 65.7 %
Platelets: 195 10*3/uL (ref 140–400)
RBC: 4.54 10*6/uL (ref 4.20–5.80)
RDW: 12.8 % (ref 11.0–15.0)
Total Lymphocyte: 25.2 %
WBC: 8 10*3/uL (ref 3.8–10.8)

## 2024-03-25 LAB — HEMOGLOBIN A1C
Hgb A1c MFr Bld: 5.7 % — ABNORMAL HIGH (ref <5.7)
Mean Plasma Glucose: 117 mg/dL
eAG (mmol/L): 6.5 mmol/L

## 2024-03-25 LAB — HIV ANTIBODY (ROUTINE TESTING W REFLEX): HIV 1&2 Ab, 4th Generation: NONREACTIVE

## 2024-03-25 LAB — LIPID PANEL
Cholesterol: 109 mg/dL (ref <200)
HDL: 46 mg/dL (ref >=40)
LDL Cholesterol (Calc): 50 mg/dL
Non-HDL Cholesterol (Calc): 63 mg/dL (ref <130)
Total CHOL/HDL Ratio: 2.4 (calc) (ref <5.0)
Triglycerides: 44 mg/dL (ref <150)

## 2024-03-25 LAB — HEPATITIS C ANTIBODY: Hepatitis C Ab: NONREACTIVE

## 2024-03-25 LAB — MICROALBUMIN / CREATININE URINE RATIO
Creatinine, Urine: 90 mg/dL (ref 20–320)
Microalb Creat Ratio: 64 mg/g{creat} — ABNORMAL HIGH (ref <30)
Microalb, Ur: 5.8 mg/dL

## 2024-03-25 LAB — VITAMIN B12: Vitamin B-12: 220 pg/mL (ref 200–1100)

## 2024-03-25 LAB — VITAMIN D 25 HYDROXY (VIT D DEFICIENCY, FRACTURES): Vit D, 25-Hydroxy: 71 ng/mL (ref 30–100)

## 2024-03-25 LAB — PSA: PSA: 0.4 ng/mL (ref <=4.00)

## 2024-05-12 ENCOUNTER — Encounter: Payer: Self-pay | Admitting: Family Medicine

## 2024-05-21 ENCOUNTER — Telehealth: Payer: Self-pay | Admitting: Acute Care

## 2024-05-21 DIAGNOSIS — Z87891 Personal history of nicotine dependence: Secondary | ICD-10-CM

## 2024-05-21 DIAGNOSIS — Z122 Encounter for screening for malignant neoplasm of respiratory organs: Secondary | ICD-10-CM

## 2024-05-21 DIAGNOSIS — F17210 Nicotine dependence, cigarettes, uncomplicated: Secondary | ICD-10-CM

## 2024-05-21 NOTE — Telephone Encounter (Signed)
 Lung Cancer Screening Narrative/Criteria Questionnaire (Cigarette Smokers Only- No Cigars/Pipes/vapes)   Luis Bonilla   SDMV:05/26/2024 9:45 [name]      Sep 24, 1956   LDCT: 06/02/2024 9:00 GI     68 y.o.   Phone: [phone]  Lung Screening Narrative (confirm age 53-77 yrs Medicare / 50-80 yrs Private pay insurance)   Insurance information:UHC   Referring Provider:Amber Kayla FNP - PCP   This screening involves an initial phone call with a team member from our program. It is called a shared decision making visit. The initial meeting is required by  insurance and Medicare to make sure you understand the program. This appointment takes about 15-20 minutes to complete. You will complete the screening scan at your scheduled date/time.  This scan takes about 5-10 minutes to complete. You can eat and drink normally before and after the scan.  Criteria questions for Lung Cancer Screening:   Are you a current or former smoker? Current Age began smoking: 68yo   If you are a former smoker, what year did you quit smoking? N/A(within 15 yrs)   To calculate your smoking history, I need an accurate estimate of how many packs of cigarettes you smoked per day and for how many years. (Not just the number of PPD you are now smoking)   Years smoking 56 x Packs per day 2 = Pack years 112   (at least 20 pack yrs)   (Make sure they understand that we need to know how much they have smoked in the past, not just the number of PPD they are smoking now)  Do you have a personal history of cancer?  No    Do you have a family history of cancer? Yes  (cancer type and and relative) Father - lung  Are you coughing up blood?  No  Have you had unexplained weight loss of 15 lbs or more in the last 6 months? No  It looks like you meet all criteria.  When would be a good time for us  to schedule you for this screening?   Additional information: N/A

## 2024-05-26 ENCOUNTER — Ambulatory Visit: Admitting: Adult Health

## 2024-05-26 ENCOUNTER — Encounter: Payer: Self-pay | Admitting: Adult Health

## 2024-05-26 DIAGNOSIS — F17210 Nicotine dependence, cigarettes, uncomplicated: Secondary | ICD-10-CM | POA: Diagnosis not present

## 2024-05-26 NOTE — Progress Notes (Signed)
  Virtual Visit via Telephone Note  I connected with Luis Bonilla , 05/26/24 9:44 AM by a telemedicine application and verified that I am speaking with the correct person using two identifiers.  Location: Patient: home Provider: home   I discussed the limitations of evaluation and management by telemedicine and the availability of in person appointments. The patient expressed understanding and agreed to proceed.   Shared Decision Making Visit Lung Cancer Screening Program [phone])   Eligibility: 68 y.o. Pack Years Smoking History Calculation = 112 pack years  (# packs/per year x # years smoked) Recent History of coughing up blood  no Unexplained weight loss? no ( >Than 15 pounds within the last 6 months ) Prior History Lung / other cancer no (Diagnosis within the last 5 years already requiring surveillance chest CT Scans). Smoking Status Current Smoker   Visit Components: Discussion included one or more decision making aids. YES Discussion included risk/benefits of screening. YES Discussion included potential follow up diagnostic testing for abnormal scans. YES Discussion included meaning and risk of over diagnosis. YES Discussion included meaning and risk of False Positives. YES Discussion included meaning of total radiation exposure. YES  Counseling Included: Importance of adherence to annual lung cancer LDCT screening. YES Impact of comorbidities on ability to participate in the program. YES Ability and willingness to under diagnostic treatment. YES  Smoking Cessation Counseling: Current Smokers:  Discussed importance of smoking cessation. yes Information about tobacco cessation classes and interventions provided to patient. yes Patient provided with ticket for LDCT Scan. yes Symptomatic Patient. NO Diagnosis Code: Tobacco Use Z72.0 Asymptomatic Patient yes  Counseling - 4 minutes of smoking cessation counseling (CT Chest Lung Cancer Screening Low Dose W/O CM)  [id number]  Z12.2-Screening of respiratory organs Z87.891-Personal history of nicotine  dependence   Luis Bonilla 05/26/24

## 2024-05-26 NOTE — Patient Instructions (Signed)

## 2024-05-29 ENCOUNTER — Other Ambulatory Visit: Payer: Self-pay | Admitting: Family Medicine

## 2024-05-29 ENCOUNTER — Encounter: Payer: Self-pay | Admitting: Family Medicine

## 2024-05-29 DIAGNOSIS — E1169 Type 2 diabetes mellitus with other specified complication: Secondary | ICD-10-CM

## 2024-05-29 MED ORDER — ROSUVASTATIN CALCIUM 5 MG PO TABS
5.0000 mg | ORAL_TABLET | Freq: Every day | ORAL | 3 refills | Status: AC
Start: 2024-05-29 — End: ?

## 2024-06-02 ENCOUNTER — Ambulatory Visit
Admission: RE | Admit: 2024-06-02 | Discharge: 2024-06-02 | Disposition: A | Discharge status: Home / Self Care | Source: Ambulatory Visit | Attending: Acute Care | Admitting: Acute Care

## 2024-06-02 DIAGNOSIS — F17210 Nicotine dependence, cigarettes, uncomplicated: Secondary | ICD-10-CM

## 2024-06-02 DIAGNOSIS — Z87891 Personal history of nicotine dependence: Secondary | ICD-10-CM

## 2024-06-02 DIAGNOSIS — Z122 Encounter for screening for malignant neoplasm of respiratory organs: Secondary | ICD-10-CM

## 2024-06-16 ENCOUNTER — Other Ambulatory Visit: Payer: Self-pay

## 2024-06-16 DIAGNOSIS — Z122 Encounter for screening for malignant neoplasm of respiratory organs: Secondary | ICD-10-CM

## 2024-06-16 DIAGNOSIS — F17210 Nicotine dependence, cigarettes, uncomplicated: Secondary | ICD-10-CM

## 2024-06-16 DIAGNOSIS — Z87891 Personal history of nicotine dependence: Secondary | ICD-10-CM

## 2024-07-07 ENCOUNTER — Other Ambulatory Visit

## 2024-07-07 DIAGNOSIS — E1169 Type 2 diabetes mellitus with other specified complication: Secondary | ICD-10-CM

## 2024-07-07 DIAGNOSIS — Z0000 Encounter for general adult medical examination without abnormal findings: Secondary | ICD-10-CM

## 2024-07-07 DIAGNOSIS — E1122 Type 2 diabetes mellitus with diabetic chronic kidney disease: Secondary | ICD-10-CM

## 2024-07-08 ENCOUNTER — Ambulatory Visit: Payer: Self-pay | Admitting: Family Medicine

## 2024-07-08 LAB — HEMOGLOBIN A1C
Hgb A1c MFr Bld: 5.9 % — ABNORMAL HIGH (ref <5.7)
Mean Plasma Glucose: 123 mg/dL
eAG (mmol/L): 6.8 mmol/L

## 2024-07-08 LAB — CBC WITH DIFFERENTIAL/PLATELET
Absolute Lymphocytes: 2637 {cells}/uL (ref 850–3900)
Absolute Monocytes: 661 {cells}/uL (ref 200–950)
Basophils Absolute: 23 {cells}/uL (ref 0–200)
Basophils Relative: 0.3 %
Eosinophils Absolute: 144 {cells}/uL (ref 15–500)
Eosinophils Relative: 1.9 %
HCT: 45.3 % (ref 38.5–50.0)
Hemoglobin: 14.9 g/dL (ref 13.2–17.1)
MCH: 29.4 pg (ref 27.0–33.0)
MCHC: 32.9 g/dL (ref 32.0–36.0)
MCV: 89.5 fL (ref 80.0–100.0)
MPV: 9.6 fL (ref 7.5–12.5)
Monocytes Relative: 8.7 %
Neutro Abs: 4134 {cells}/uL (ref 1500–7800)
Neutrophils Relative %: 54.4 %
Platelets: 192 Thousand/uL (ref 140–400)
RBC: 5.06 Million/uL (ref 4.20–5.80)
RDW: 13.2 % (ref 11.0–15.0)
Total Lymphocyte: 34.7 %
WBC: 7.6 Thousand/uL (ref 3.8–10.8)

## 2024-07-08 LAB — LIPID PANEL
Cholesterol: 99 mg/dL (ref <200)
HDL: 40 mg/dL (ref >=40)
LDL Cholesterol (Calc): 45 mg/dL
Non-HDL Cholesterol (Calc): 59 mg/dL (ref <130)
Total CHOL/HDL Ratio: 2.5 (calc) (ref <5.0)
Triglycerides: 59 mg/dL (ref <150)

## 2024-07-08 LAB — COMPREHENSIVE METABOLIC PANEL WITH GFR
AG Ratio: 1.4 (calc) (ref 1.0–2.5)
ALT: 10 U/L (ref 9–46)
AST: 13 U/L (ref 10–35)
Albumin: 4.2 g/dL (ref 3.6–5.1)
Alkaline phosphatase (APISO): 72 U/L (ref 35–144)
BUN: 14 mg/dL (ref 7–25)
CO2: 28 mmol/L (ref 20–32)
Calcium: 9.2 mg/dL (ref 8.6–10.3)
Chloride: 102 mmol/L (ref 98–110)
Creat: 1.13 mg/dL (ref 0.70–1.35)
Globulin: 2.9 g/dL (ref 1.9–3.7)
Glucose, Bld: 113 mg/dL — ABNORMAL HIGH (ref 65–99)
Potassium: 4.4 mmol/L (ref 3.5–5.3)
Sodium: 136 mmol/L (ref 135–146)
Total Bilirubin: 0.4 mg/dL (ref 0.2–1.2)
Total Protein: 7.1 g/dL (ref 6.1–8.1)
eGFR: 72 mL/min/1.73m2 (ref >=60)

## 2024-07-14 ENCOUNTER — Ambulatory Visit: Admitting: Family Medicine

## 2024-07-14 ENCOUNTER — Encounter: Payer: Self-pay | Admitting: Family Medicine

## 2024-07-14 VITALS — BP 119/78 | HR 58 | Ht 67.5 in | Wt 202.2 lb

## 2024-07-14 DIAGNOSIS — I959 Hypotension, unspecified: Secondary | ICD-10-CM | POA: Diagnosis not present

## 2024-07-14 MED ORDER — MIDODRINE HCL 2.5 MG PO TABS: 2.5000 mg | ORAL_TABLET | Freq: Two times a day (BID) | ORAL | 3 refills | Status: DC

## 2024-07-14 NOTE — Progress Notes (Signed)
 Subjective:  HPI: Luis Bonilla is a 68 y.o. male presenting on 07/14/2024 for Medical Management of Chronic Issues (Discuss medications Florinef , and Proamatine . )   HPI Patient is in today for AWV that is not yet due. He does have questions about his Florinef  and Midodrine . Florinef  was previously discontinued by his previous PCP however he restarted it while between providers due to some dizziness and low BP. Has been taking Florinef  up until a month ago when he ran out. Home BP readings have been normal since stopping. He was also taking Midodrine  BID and ran out of this today. He is wondering if he can stay off of this medication as well. Luis Bonilla has history of hypertension that required medication however after he was started on mounjaro  he had an 80 pound weight loss in 7 months and subsequently became hypotensive on those medications. Began requiring BP support with Florinef  and Midodrine .  Denies. chest pain, palpitations, recurrent headaches, vision changes, lightheadedness, dizziness, dyspnea on exertion, or swelling of extremities.   Review of Systems  All other systems reviewed and are negative.   Relevant past medical history reviewed and updated as indicated.   Past Medical History:  Diagnosis Date   Allergy 1999   Arthritis    LEFT shoulder/RIGHT hand   Asthma    uses inhaler   Chronic kidney disease    COPD (chronic obstructive pulmonary disease) (HCC)    stage 1   Erectile dysfunction    Hyperlipidemia    on meds   Hypertension    on meds   Other testicular hypofunction    Sleep apnea    uses CPAP nightly   Type II or unspecified type diabetes mellitus without mention of complication, not stated as uncontrolled    on meds     Past Surgical History:  Procedure Laterality Date   DENTAL SURGERY     VASECTOMY      Allergies and medications reviewed and updated.   Current Outpatient Medications:    albuterol  (VENTOLIN  HFA) 108 (90 Base) MCG/ACT  inhaler, USE 2 INHALATIONS BY MOUTH 15  MINUTES APART EVERY 4 HOURS AS  NEEDED TO RESCUE, Disp: 51 g, Rfl: 3   BREO ELLIPTA  100-25 MCG/ACT AEPB, USE 1 INHALATION BY MOUTH ONCE  DAILY AT THE SAME TIME EACH DAY, Disp: 180 each, Rfl: 3   midodrine  (PROAMATINE ) 2.5 MG tablet, Take 1 tablet (2.5 mg total) by mouth 2 (two) times daily with a meal., Disp: 60 tablet, Rfl: 3   MOUNJARO  7.5 MG/0.5ML Pen, INJECT THE CONTENTS OF ONE PEN  SUBCUTANEOUSLY WEEKLY AS  DIRECTED, Disp: 6 mL, Rfl: 3   ONETOUCH ULTRA test strip, CHECK BLOOD SUGARS FASTING DAILY, Disp: 100 strip, Rfl: 12   rosuvastatin  (CRESTOR ) 5 MG tablet, Take 1 tablet (5 mg total) by mouth daily., Disp: 90 tablet, Rfl: 3   tadalafil  (CIALIS ) 20 MG tablet, TAKE 1/2 TO 1 TABLET EVERY 2 TO 3 DAYS IF NEEDED, Disp: 6 tablet, Rfl: 14   Vitamin D , Ergocalciferol , (DRISDOL ) 1.25 MG (50000 UNIT) CAPS capsule, 1 capsule once a week for Vitamin D  Deficiency, Disp: 13 capsule, Rfl: 3   fludrocortisone  (FLORINEF ) 0.1 MG tablet, Take 0.1 mg by mouth daily., Disp: , Rfl:   Allergies  Allergen Reactions   Flaxseed (Linseed)     Other reaction(s): Other Bad after taste.   Flax [Flax Seed Oil]     Objective:   BP 119/78   Pulse (!) 58   Ht  5' 7.5 (1.715 m)   Wt 202 lb 3.2 oz (91.7 kg)   SpO2 98%   BMI 31.20 kg/m      07/14/2024   11:42 AM 03/24/2024    1:58 PM 10/29/2023    1:42 PM  Vitals with BMI  Height 5' 7.5 5' 7.5 5' 7.5  Weight 202 lbs 3 oz 204 lbs 10 oz 193 lbs  BMI 31.18 31.55 29.76  Systolic 119 122 897  Diastolic 78 88 68  Pulse 58 85 59     Physical Exam Vitals and nursing note reviewed.  Constitutional:      Appearance: Normal appearance. He is normal weight.  HENT:     Head: Normocephalic and atraumatic.  Cardiovascular:     Rate and Rhythm: Normal rate and regular rhythm.     Pulses: Normal pulses.     Heart sounds: Normal heart sounds.  Pulmonary:     Effort: Pulmonary effort is normal.     Breath sounds: Normal  breath sounds.  Skin:    General: Skin is warm and dry.     Capillary Refill: Capillary refill takes less than 2 seconds.  Neurological:     General: No focal deficit present.     Mental Status: He is alert and oriented to person, place, and time. Mental status is at baseline.  Psychiatric:        Mood and Affect: Mood normal.        Behavior: Behavior normal.        Thought Content: Thought content normal.        Judgment: Judgment normal.     Assessment & Plan:  Hypotension, unspecified hypotension type Assessment & Plan: OK to remain off Florinef . Will reduce Midodrine  to 2.5mg  BID. He will continue to monitor BP at home BID and report readings to me via MyChart in 1 month. If BP sustained will consider stopping Midodrine  as well. Follow up in office if you have elevations of BP or chest pain, palpitations, recurrent headaches, vision changes, lightheadedness, dizziness, dyspnea on exertion, or swelling of extremities.    Other orders -     Midodrine  HCl; Take 1 tablet (2.5 mg total) by mouth 2 (two) times daily with a meal.  Dispense: 60 tablet; Refill: 3     Follow up plan: Return in about 15 weeks (around 10/27/2024) for annual physical with labs 1 week prior, AWV [facility]  Luis GORMAN Barrio, FNP

## 2024-07-14 NOTE — Assessment & Plan Note (Signed)
 OK to remain off Florinef . Will reduce Midodrine  to 2.5mg  BID. He will continue to monitor BP at home BID and report readings to me via MyChart in 1 month. If BP sustained will consider stopping Midodrine  as well. Follow up in office if you have elevations of BP or chest pain, palpitations, recurrent headaches, vision changes, lightheadedness, dizziness, dyspnea on exertion, or swelling of extremities.

## 2024-08-21 ENCOUNTER — Other Ambulatory Visit: Payer: Self-pay | Admitting: Nurse Practitioner

## 2024-08-21 DIAGNOSIS — E1169 Type 2 diabetes mellitus with other specified complication: Secondary | ICD-10-CM

## 2024-08-28 ENCOUNTER — Other Ambulatory Visit: Payer: Self-pay | Admitting: Family Medicine

## 2024-08-28 DIAGNOSIS — E1122 Type 2 diabetes mellitus with diabetic chronic kidney disease: Secondary | ICD-10-CM

## 2024-08-28 DIAGNOSIS — E1169 Type 2 diabetes mellitus with other specified complication: Secondary | ICD-10-CM

## 2024-08-28 NOTE — Telephone Encounter (Signed)
 Copied from CRM #[id number]. Topic: Clinical - Medication Refill >> Aug 28, 2024 12:03 PM Luis Bonilla wrote: Medication: Patient is requesting (90 day supply of each Rx)  tadalafil  tadalafil  (CIALIS ) 20 MG tablet  OneTouch Ultra ONETOUCH ULTRA test strip  Has the patient contacted their pharmacy? Yes (Agent: If no, request that the patient contact the pharmacy for the refill. If patient does not wish to contact the pharmacy document the reason why and proceed with request.) (Agent: If yes, when and what did the pharmacy advise?)  This is the patient's preferred pharmacy:  [id number] - [place], [place] - [address] AT [facility] [address] [place] [address] Phone: [phone] Fax: [phone]   Is this the correct pharmacy for this prescription? Yes If no, delete pharmacy and type the correct one.   Has the prescription been filled recently? Yes  Is the patient out of the medication? Yes  Has the patient been seen for an appointment in the last year OR does the patient have an upcoming appointment? Yes  Can we respond through MyChart? Yes  Agent: Please be advised that Rx refills may take up to 3 business days. We ask that you follow-up with your pharmacy.

## 2024-08-29 NOTE — Telephone Encounter (Signed)
 Requested medications are due for refill today.  yes  Requested medications are on the active medications list.  yes  Last refill. 08/13/2023 for both  Future visit scheduled.   yes  Notes to clinic.  Both rx's signed by Lonell Liverpool.    Requested Prescriptions  Pending Prescriptions Disp Refills   tadalafil  (CIALIS ) 20 MG tablet 6 tablet 14    Sig: TAKE 1/2 TO 1 TABLET EVERY 2 TO 3 DAYS IF NEEDED     Urology: Erectile Dysfunction Agents Passed - 08/29/2024  2:57 PM      Passed - AST in normal range and within 360 days    AST  Date Value Ref Range Status  07/07/2024 13 10 - 35 U/L Final         Passed - ALT in normal range and within 360 days    ALT  Date Value Ref Range Status  07/07/2024 10 9 - 46 U/L Final         Passed - Last BP in normal range    BP Readings from Last 1 Encounters:  07/14/24 119/78         Passed - Valid encounter within last 12 months    Recent Outpatient Visits           1 month ago Hypotension, unspecified hypotension type   [place] [facility] Kayla Jeoffrey RAMAN, FNP   5 months ago Physical exam, annual   [place] [facility] Kayla Jeoffrey S, FNP               glucose blood (ONETOUCH ULTRA) test strip 100 strip 12    Sig: Use as instructed     Endocrinology: Diabetes - Testing Supplies Passed - 08/29/2024  2:57 PM      Passed - Valid encounter within last 12 months    Recent Outpatient Visits           1 month ago Hypotension, unspecified hypotension type   [place] [facility] Kayla Jeoffrey RAMAN, FNP   5 months ago Physical exam, annual   [place] [facility] Kayla Jeoffrey RAMAN, FNP

## 2024-09-12 ENCOUNTER — Other Ambulatory Visit: Payer: Self-pay | Admitting: Family Medicine

## 2024-09-12 DIAGNOSIS — E1122 Type 2 diabetes mellitus with diabetic chronic kidney disease: Secondary | ICD-10-CM

## 2024-09-12 MED ORDER — ONETOUCH ULTRA VI STRP: ORAL_STRIP | 12 refills | Status: DC

## 2024-09-12 MED ORDER — TADALAFIL 20 MG PO TABS: ORAL_TABLET | ORAL | 14 refills | Status: DC

## 2024-09-16 ENCOUNTER — Other Ambulatory Visit: Payer: Self-pay | Admitting: Nurse Practitioner

## 2024-09-16 DIAGNOSIS — E1122 Type 2 diabetes mellitus with diabetic chronic kidney disease: Secondary | ICD-10-CM

## 2024-10-08 ENCOUNTER — Other Ambulatory Visit: Payer: Self-pay | Admitting: Family Medicine

## 2024-10-27 ENCOUNTER — Other Ambulatory Visit

## 2024-10-27 DIAGNOSIS — Z0000 Encounter for general adult medical examination without abnormal findings: Secondary | ICD-10-CM

## 2024-10-28 ENCOUNTER — Ambulatory Visit: Payer: Self-pay | Admitting: Family Medicine

## 2024-10-28 LAB — CBC WITH DIFFERENTIAL/PLATELET
Absolute Lymphocytes: 2380 {cells}/uL (ref 850–3900)
Absolute Monocytes: 673 {cells}/uL (ref 200–950)
Basophils Absolute: 20 {cells}/uL (ref 0–200)
Basophils Relative: 0.3 %
Eosinophils Absolute: 109 {cells}/uL (ref 15–500)
Eosinophils Relative: 1.6 %
HCT: 42.2 % (ref 39.4–51.1)
Hemoglobin: 13.8 g/dL (ref 13.2–17.1)
MCH: 29.6 pg (ref 27.0–33.0)
MCHC: 32.7 g/dL (ref 31.6–35.4)
MCV: 90.6 fL (ref 81.4–101.7)
MPV: 9.7 fL (ref 7.5–12.5)
Monocytes Relative: 9.9 %
Neutro Abs: 3618 {cells}/uL (ref 1500–7800)
Neutrophils Relative %: 53.2 %
Platelets: 163 Thousand/uL (ref 140–400)
RBC: 4.66 Million/uL (ref 4.20–5.80)
RDW: 13 % (ref 11.0–15.0)
Total Lymphocyte: 35 %
WBC: 6.8 Thousand/uL (ref 3.8–10.8)

## 2024-10-28 LAB — MICROALBUMIN / CREATININE URINE RATIO
Creatinine, Urine: 81 mg/dL (ref 20–320)
Microalb Creat Ratio: 17 mg/g{creat}
Microalb, Ur: 1.4 mg/dL

## 2024-10-28 LAB — COMPREHENSIVE METABOLIC PANEL WITH GFR
AG Ratio: 1.7 (calc) (ref 1.0–2.5)
ALT: 13 U/L (ref 9–46)
AST: 13 U/L (ref 10–35)
Albumin: 4.3 g/dL (ref 3.6–5.1)
Alkaline phosphatase (APISO): 83 U/L (ref 35–144)
BUN: 16 mg/dL (ref 7–25)
CO2: 25 mmol/L (ref 20–32)
Calcium: 9.2 mg/dL (ref 8.6–10.3)
Chloride: 103 mmol/L (ref 98–110)
Creat: 0.99 mg/dL (ref 0.70–1.35)
Globulin: 2.5 g/dL (ref 1.9–3.7)
Glucose, Bld: 124 mg/dL — ABNORMAL HIGH (ref 65–99)
Potassium: 4.1 mmol/L (ref 3.5–5.3)
Sodium: 138 mmol/L (ref 135–146)
Total Bilirubin: 0.4 mg/dL (ref 0.2–1.2)
Total Protein: 6.8 g/dL (ref 6.1–8.1)
eGFR: 84 mL/min/1.73m2

## 2024-10-28 LAB — HEMOGLOBIN A1C
Hgb A1c MFr Bld: 5.7 % — ABNORMAL HIGH
Mean Plasma Glucose: 117 mg/dL
eAG (mmol/L): 6.5 mmol/L

## 2024-10-28 LAB — LIPID PANEL
Cholesterol: 102 mg/dL
HDL: 48 mg/dL
LDL Cholesterol (Calc): 42 mg/dL
Non-HDL Cholesterol (Calc): 54 mg/dL
Total CHOL/HDL Ratio: 2.1 (calc)
Triglycerides: 50 mg/dL

## 2024-10-28 LAB — PSA: PSA: 0.5 ng/mL

## 2024-10-28 LAB — TSH: TSH: 3.56 m[IU]/L (ref 0.40–4.50)

## 2024-10-28 LAB — VITAMIN D 25 HYDROXY (VIT D DEFICIENCY, FRACTURES): Vit D, 25-Hydroxy: 49 ng/mL (ref 30–100)

## 2024-10-30 ENCOUNTER — Encounter: Admitting: Family Medicine

## 2024-10-30 ENCOUNTER — Ambulatory Visit

## 2024-11-18 ENCOUNTER — Encounter: Payer: Self-pay | Admitting: Family Medicine

## 2024-11-18 ENCOUNTER — Ambulatory Visit: Admitting: Family Medicine

## 2024-11-18 ENCOUNTER — Encounter: Admitting: Family Medicine

## 2024-11-18 VITALS — BP 118/62 | HR 65 | Temp 97.7°F | Ht 67.5 in | Wt 216.0 lb

## 2024-11-18 DIAGNOSIS — I959 Hypotension, unspecified: Secondary | ICD-10-CM

## 2024-11-18 DIAGNOSIS — E1122 Type 2 diabetes mellitus with diabetic chronic kidney disease: Secondary | ICD-10-CM

## 2024-11-18 DIAGNOSIS — N182 Chronic kidney disease, stage 2 (mild): Secondary | ICD-10-CM | POA: Diagnosis not present

## 2024-11-18 DIAGNOSIS — N521 Erectile dysfunction due to diseases classified elsewhere: Secondary | ICD-10-CM | POA: Diagnosis not present

## 2024-11-18 DIAGNOSIS — Z0001 Encounter for general adult medical examination with abnormal findings: Secondary | ICD-10-CM | POA: Diagnosis not present

## 2024-11-18 DIAGNOSIS — J432 Centrilobular emphysema: Secondary | ICD-10-CM

## 2024-11-18 DIAGNOSIS — E1169 Type 2 diabetes mellitus with other specified complication: Secondary | ICD-10-CM | POA: Diagnosis not present

## 2024-11-18 DIAGNOSIS — Z7985 Long-term (current) use of injectable non-insulin antidiabetic drugs: Secondary | ICD-10-CM | POA: Diagnosis not present

## 2024-11-18 DIAGNOSIS — Z0000 Encounter for general adult medical examination without abnormal findings: Secondary | ICD-10-CM

## 2024-11-18 MED ORDER — TADALAFIL 20 MG PO TABS: ORAL_TABLET | ORAL | 14 refills | Status: AC

## 2024-11-18 MED ORDER — ONETOUCH ULTRA VI STRP: ORAL_STRIP | 12 refills | Status: AC

## 2024-11-18 MED ORDER — TIRZEPATIDE 5 MG/0.5ML ~~LOC~~ SOAJ: 5.0000 mg | SUBCUTANEOUS | 0 refills | Status: AC

## 2024-11-18 MED ORDER — ALBUTEROL SULFATE HFA 108 (90 BASE) MCG/ACT IN AERS: INHALATION_SPRAY | RESPIRATORY_TRACT | 3 refills | Status: AC

## 2024-11-18 MED ORDER — FLUTICASONE FUROATE-VILANTEROL 100-25 MCG/ACT IN AEPB: 1.0000 | INHALATION_SPRAY | Freq: Every day | RESPIRATORY_TRACT | 3 refills | Status: AC

## 2024-11-18 NOTE — Assessment & Plan Note (Signed)
 Well controlled on Breo and PRN Albuterol . Refill provided

## 2024-11-18 NOTE — Progress Notes (Signed)
 "  Subjective:  HPI: Luis Bonilla is a 69 y.o. male presenting on 11/18/2024 for Medicare Wellness ([facility])   HPI Patient is in today for AWV and would like to discuss medication management. He would like to discontinue his Midodrine  and reduce his Mounjaro . He is concerned about his recent change to medicare and coverage of his medications. Has been on Mounjaro  for years, most recent A1c being in prediabetic range since losing 80 pounds. Has recently gained back some of the weight. He is not monitoring his BP or BG routinely at home. Denies chest pain, palpitations, recurrent headaches, vision changes, lightheadedness, dizziness, dyspnea on exertion, or swelling of extremities, polyuria, polydypsia, paresthesias. He also requests refills of his Cialis , Breo, and Albuterol .  Review of Systems  All other systems reviewed and are negative.   Relevant past medical history reviewed and updated as indicated.   Past Medical History:  Diagnosis Date   Allergy 1999   Arthritis    LEFT shoulder/RIGHT hand   Asthma    uses inhaler   Chronic kidney disease    COPD (chronic obstructive pulmonary disease) (HCC)    stage 1   Erectile dysfunction    Hyperlipidemia    on meds   Hypertension    on meds   Other testicular hypofunction    Sleep apnea    uses CPAP nightly   Type II or unspecified type diabetes mellitus without mention of complication, not stated as uncontrolled    on meds     Past Surgical History:  Procedure Laterality Date   DENTAL SURGERY     VASECTOMY      Allergies and medications reviewed and updated.  Current Medications[1]  Allergies[2]  Objective:   BP 118/62   Pulse 65   Temp 97.7 F (36.5 C)   Ht 5' 7.5 (1.715 m)   Wt 216 lb (98 kg)   SpO2 98%   BMI 33.33 kg/m      11/18/2024    3:03 PM 07/14/2024   11:42 AM 03/24/2024    1:58 PM  Vitals with BMI  Height 5' 7.5 5' 7.5 5' 7.5  Weight 216 lbs 202 lbs 3 oz 204 lbs 10 oz  BMI  33.31 31.18 31.55  Systolic 118 119 877  Diastolic 62 78 88  Pulse 65 58 85     Physical Exam Vitals and nursing note reviewed.  Constitutional:      Appearance: Normal appearance. He is normal weight.  HENT:     Head: Normocephalic and atraumatic.  Cardiovascular:     Rate and Rhythm: Normal rate and regular rhythm.     Pulses: Normal pulses.     Heart sounds: Normal heart sounds.  Pulmonary:     Effort: Pulmonary effort is normal.     Breath sounds: Normal breath sounds.  Skin:    General: Skin is warm and dry.     Capillary Refill: Capillary refill takes less than 2 seconds.  Neurological:     General: No focal deficit present.     Mental Status: He is alert and oriented to person, place, and time. Mental status is at baseline.  Psychiatric:        Mood and Affect: Mood normal.        Behavior: Behavior normal.        Thought Content: Thought content normal.        Judgment: Judgment normal.     Assessment & Plan:  Encounter for Medicare  annual wellness exam  Type 2 diabetes mellitus with stage 2 chronic kidney disease, without long-term current use of insulin  [facility]) Assessment & Plan: Mr Decelle has lost a large amount of weight since starting Mounjaro  and would like to try to wean off and assess control of his diabetes. Decrease to 5mg  weekly. Monitor BG at home daily fastings and report if readings sustain >150. Follow up in 1 month  Lab Results  Component Value Date   HGBA1C 5.7 (H) 10/27/2024   HGBA1C 5.9 (H) 07/07/2024   HGBA1C 5.7 (H) 03/24/2024      Erectile dysfunction associated with type 2 diabetes mellitus (HCC) Assessment & Plan: Refill Cialis  provided  Orders: -     Tadalafil ; TAKE 1/2 TO 1 TABLET EVERY 2 TO 3 DAYS IF NEEDED  Dispense: 6 tablet; Refill: 14  Centrilobular emphysema (HCC) Assessment & Plan: Well controlled on Breo and PRN Albuterol . Refill provided   Hypotension, unspecified hypotension type Assessment & Plan: OK to wean off  Midodrine  2.5mg  daily for 1 week then discontinue. Monitor BP at home and report to office if readings sustain <110/60. Seek medical care for chest pain, palpitations, recurrent headaches, vision changes, lightheadedness, dizziness, dyspnea on exertion, or swelling of extremities. Follow up in 2-4 weeks.    Other orders -     Tirzepatide ; Inject 5 mg into the skin once a week for 28 days.  Dispense: 2 mL; Refill: 0 -     Albuterol  Sulfate HFA; USE 2 INHALATIONS BY MOUTH 15  MINUTES APART EVERY 4 HOURS AS  NEEDED TO RESCUE  Dispense: 51 g; Refill: 3 -     Fluticasone  Furoate-Vilanterol; Inhale 1 puff into the lungs daily.  Dispense: 180 each; Refill: 3     Follow up plan: Return in 4 weeks (on 12/16/2024) for hypertension, diabetes, follow-up.  [name], FNP         [1]  Current Outpatient Medications:    glucose blood (ONETOUCH ULTRA) test strip, CHECK BLOOD SUGARS FASTING DAILY, Disp: 100 strip, Rfl: 12   midodrine  (PROAMATINE ) 2.5 MG tablet, TAKE 1 TABLET (2.5 MG TOTAL) BY MOUTH 2 (TWO) TIMES DAILY WITH A MEAL., Disp: 180 tablet, Rfl: 1   rosuvastatin  (CRESTOR ) 5 MG tablet, Take 1 tablet (5 mg total) by mouth daily., Disp: 90 tablet, Rfl: 3   tirzepatide  (MOUNJARO ) 5 MG/0.5ML Pen, Inject 5 mg into the skin once a week for 28 days., Disp: 2 mL, Rfl: 0   Vitamin D , Ergocalciferol , (DRISDOL ) 1.25 MG (50000 UNIT) CAPS capsule, 1 capsule once a week for Vitamin D  Deficiency, Disp: 13 capsule, Rfl: 3   albuterol  (VENTOLIN  HFA) 108 (90 Base) MCG/ACT inhaler, USE 2 INHALATIONS BY MOUTH 15  MINUTES APART EVERY 4 HOURS AS  NEEDED TO RESCUE, Disp: 51 g, Rfl: 3   fluticasone  furoate-vilanterol (BREO ELLIPTA ) 100-25 MCG/ACT AEPB, Inhale 1 puff into the lungs daily., Disp: 180 each, Rfl: 3   tadalafil  (CIALIS ) 20 MG tablet, TAKE 1/2 TO 1 TABLET EVERY 2 TO 3 DAYS IF NEEDED, Disp: 6 tablet, Rfl: 14 [2]  Allergies Allergen Reactions   Flaxseed (Linseed)     Other reaction(s): Other Bad  after taste.   Flax [Flax Seed Oil]    "

## 2024-11-18 NOTE — Patient Instructions (Addendum)
 Luis Bonilla,  Thank you for taking the time for your Medicare Wellness Visit. I appreciate your continued commitment to your health goals. Please review the care plan we discussed, and feel free to reach out if I can assist you further.  Please note that Annual Wellness Visits do not include a physical exam. Some assessments may be limited, especially if the visit was conducted virtually. If needed, we may recommend an in-person follow-up with your provider.  Ongoing Care Seeing your primary care provider every 3 to 6 months helps us  monitor your health and provide consistent, personalized care.   Referrals If a referral was made during today's visit and you haven't received any updates within two weeks, please contact the referred provider directly to check on the status.  Recommended Screenings:  Health Maintenance  Topic Date Due   Eye exam for diabetics  11/23/2022   Zoster (Shingles) Vaccine (2 of 2) 05/19/2024   Colon Cancer Screening  01/08/2025   Flu Shot  01/20/2025*   DTaP/Tdap/Td vaccine (2 - Td or Tdap) 01/03/2025   Complete foot exam   03/24/2025   Hemoglobin A1C  04/26/2025   Screening for Lung Cancer  06/02/2025   Yearly kidney function blood test for diabetes  10/27/2025   Kidney health urinalysis for diabetes  10/27/2025   Medicare Annual Wellness Visit  11/18/2025   Pneumococcal Vaccine for age over 58  Completed   Hepatitis C Screening  Completed   Meningitis B Vaccine  Aged Out   COVID-19 Vaccine  Discontinued  *Topic was postponed. The date shown is not the original due date.       11/18/2024    3:08 PM  Advanced Directives  Does Patient Have a Medical Advance Directive? Yes  Type of Estate Agent of [place];Living will  Copy of Healthcare Power of Attorney in Chart? No - copy requested    Vision: Annual vision screenings are recommended for early detection of glaucoma, cataracts, and diabetic retinopathy. These exams can also reveal  signs of chronic conditions such as diabetes and high blood pressure.  Dental: Annual dental screenings help detect early signs of oral cancer, gum disease, and other conditions linked to overall health, including heart disease and diabetes.  Please see the attached documents for additional preventive care recommendations. Health Maintenance, Male Adopting a healthy lifestyle and getting preventive care are important in promoting health and wellness. Ask your health care provider about: The right schedule for you to have regular tests and exams. Things you can do on your own to prevent diseases and keep yourself healthy. What should I know about diet, weight, and exercise? Eat a healthy diet  Eat a diet that includes plenty of vegetables, fruits, low-fat dairy products, and lean protein. Do not eat a lot of foods that are high in solid fats, added sugars, or sodium. Maintain a healthy weight Body mass index (BMI) is a measurement that can be used to identify possible weight problems. It estimates body fat based on height and weight. Your health care provider can help determine your BMI and help you achieve or maintain a healthy weight. Get regular exercise Get regular exercise. This is one of the most important things you can do for your health. Most adults should: Exercise for at least 150 minutes each week. The exercise should increase your heart rate and make you sweat (moderate-intensity exercise). Do strengthening exercises at least twice a week. This is in addition to the moderate-intensity exercise. Spend less time  sitting. Even light physical activity can be beneficial. Watch cholesterol and blood lipids Have your blood tested for lipids and cholesterol at 67 years of age, then have this test every 5 years. You may need to have your cholesterol levels checked more often if: Your lipid or cholesterol levels are high. You are older than 67 years of age. You are at high risk for heart  disease. What should I know about cancer screening? Many types of cancers can be detected early and may often be prevented. Depending on your health history and family history, you may need to have cancer screening at various ages. This may include screening for: Colorectal cancer. Prostate cancer. Skin cancer. Lung cancer. What should I know about heart disease, diabetes, and high blood pressure? Blood pressure and heart disease High blood pressure causes heart disease and increases the risk of stroke. This is more likely to develop in people who have high blood pressure readings or are overweight. Talk with your health care provider about your target blood pressure readings. Have your blood pressure checked: Every 3-5 years if you are 96-27 years of age. Every year if you are 68 years old or older. If you are between the ages of 52 and 65 and are a current or former smoker, ask your health care provider if you should have a one-time screening for abdominal aortic aneurysm (AAA). Diabetes Have regular diabetes screenings. This checks your fasting blood sugar level. Have the screening done: Once every three years after age 27 if you are at a normal weight and have a low risk for diabetes. More often and at a younger age if you are overweight or have a high risk for diabetes. What should I know about preventing infection? Hepatitis B If you have a higher risk for hepatitis B, you should be screened for this virus. Talk with your health care provider to find out if you are at risk for hepatitis B infection. Hepatitis C Blood testing is recommended for: Everyone born from [birth year] through 1963. Anyone with known risk factors for hepatitis C. Sexually transmitted infections (STIs) You should be screened each year for STIs, including gonorrhea and chlamydia, if: You are sexually active and are younger than 67 years of age. You are older than 67 years of age and your health care provider tells you  that you are at risk for this type of infection. Your sexual activity has changed since you were last screened, and you are at increased risk for chlamydia or gonorrhea. Ask your health care provider if you are at risk. Ask your health care provider about whether you are at high risk for HIV. Your health care provider may recommend a prescription medicine to help prevent HIV infection. If you choose to take medicine to prevent HIV, you should first get tested for HIV. You should then be tested every 3 months for as long as you are taking the medicine. Follow these instructions at home: Alcohol use Do not drink alcohol if your health care provider tells you not to drink. If you drink alcohol: Limit how much you have to 0-2 drinks a day. Know how much alcohol is in your drink. In the [place] one drink equals one 12 oz bottle of beer (355 mL), one 5 oz glass of wine (148 mL), or one 1 oz glass of hard liquor (44 mL). Lifestyle Do not use any products that contain nicotine  or tobacco. These products include cigarettes, chewing tobacco, and vaping devices, such as e-cigarettes.  If you need help quitting, ask your health care provider. Do not use street drugs. Do not share needles. Ask your health care provider for help if you need support or information about quitting drugs. General instructions Schedule regular health, dental, and eye exams. Stay current with your vaccines. Tell your health care provider if: You often feel depressed. You have ever been abused or do not feel safe at home. Summary Adopting a healthy lifestyle and getting preventive care are important in promoting health and wellness. Follow your health care provider's instructions about healthy diet, exercising, and getting tested or screened for diseases. Follow your health care provider's instructions on monitoring your cholesterol and blood pressure. This information is not intended to replace advice given to you by your health  care provider. Make sure you discuss any questions you have with your health care provider. Document Revised: 02/28/2021 Document Reviewed: 02/28/2021 Elsevier Patient Education  2024 [employer] Steps to Quit Smoking Smoking tobacco is the leading cause of preventable death. It can affect almost every organ in the body. Smoking puts you and people around you at risk for many serious, long-lasting (chronic) diseases. Quitting smoking can be hard, but it is one of the best things that you can do for your health. It is never too late to quit. Do not give up if you cannot quit the first time. Some people need to try many times to quit. Do your best to stick to your quit plan, and talk with your doctor if you have any questions or concerns. How do I get ready to quit? Pick a date to quit. Set a date within the next 2 weeks to give you time to prepare. Write down the reasons why you are quitting. Keep this list in places where you will see it often. Tell your family, friends, and co-workers that you are quitting. Their support is important. Talk with your doctor about the choices that may help you quit. Find out if your health insurance will pay for these treatments. Know the people, places, things, and activities that make you want to smoke (triggers). Avoid them. What first steps can I take to quit smoking? Throw away all cigarettes at home, at work, and in your car. Throw away the things that you use when you smoke, such as ashtrays and lighters. Clean your car. Empty the ashtray. Clean your home, including curtains and carpets. What can I do to help me quit smoking? Talk with your doctor about taking medicines and seeing a counselor. You are more likely to succeed when you do both. If you are pregnant or breastfeeding: Talk with your doctor about counseling or other ways to quit smoking. Do not take medicine to help you quit smoking unless your doctor tells you to. Quit right away Quit  smoking completely, instead of slowly cutting back on how much you smoke over a period of time. Stopping smoking right away may be more successful than slowly quitting. Go to counseling. In-person is best if this is an option. You are more likely to quit if you go to counseling sessions regularly. Take medicine You may take medicines to help you quit. Some medicines need a prescription, and some you can buy over-the-counter. Some medicines may contain a drug called nicotine  to replace the nicotine  in cigarettes. Medicines may: Help you stop having the desire to smoke (cravings). Help to stop the problems that come when you stop smoking (withdrawal symptoms). Your doctor may ask you to use: Nicotine  patches,  gum, or lozenges. Nicotine  inhalers or sprays. Non-nicotine  medicine that you take by mouth. Find resources Find resources and other ways to help you quit smoking and remain smoke-free after you quit. They include: Online chats with a veterinary surgeon. Phone quitlines. Printed materials engineer. Support groups or group counseling. Text messaging programs. Mobile phone apps. Use apps on your mobile phone or tablet that can help you stick to your quit plan. Examples of free services include Quit Guide from the CDC and smokefree.gov  What can I do to make it easier to quit?  Talk to your family and friends. Ask them to support and encourage you. Call a phone quitline, such as 1-800-QUIT-NOW, reach out to support groups, or work with a veterinary surgeon. Ask people who smoke to not smoke around you. Avoid places that make you want to smoke, such as: Bars. Parties. Smoke-break areas at work. Spend time with people who do not smoke. Lower the stress in your life. Stress can make you want to smoke. Try these things to lower stress: Getting regular exercise. Doing deep-breathing exercises. Doing yoga. Meditating. What benefits will I see if I quit smoking? Over time, you may have: A better sense of  smell and taste. Less coughing and sore throat. A slower heart rate. Lower blood pressure. Clearer skin. Better breathing. Fewer sick days. Summary Quitting smoking can be hard, but it is one of the best things that you can do for your health. Do not give up if you cannot quit the first time. Some people need to try many times to quit. When you decide to quit smoking, make a plan to help you succeed. Quit smoking right away, not slowly over a period of time. When you start quitting, get help and support to keep you smoke-free. This information is not intended to replace advice given to you by your health care provider. Make sure you discuss any questions you have with your health care provider. Document Revised: 09/30/2021 Document Reviewed: 09/30/2021 Elsevier Patient Education  2024 [employer].

## 2024-11-18 NOTE — Assessment & Plan Note (Signed)
 OK to wean off Midodrine  2.5mg  daily for 1 week then discontinue. Monitor BP at home and report to office if readings sustain <110/60. Seek medical care for chest pain, palpitations, recurrent headaches, vision changes, lightheadedness, dizziness, dyspnea on exertion, or swelling of extremities. Follow up in 2-4 weeks.

## 2024-11-18 NOTE — Progress Notes (Signed)
 "  Chief Complaint  Patient presents with   Medicare Wellness    Welcome to Medicare     Subjective:   Luis Bonilla is a 69 y.o. male who presents for a Welcome to Medicare Exam.   Visit info / Clinical Intake: Medicare Wellness Visit Type:: Welcome to [employer] (IPPE) Persons participating in visit and providing information:: patient Medicare Wellness Visit Mode:: In-person (required for [facility]) Interpreter Needed?: No Pre-visit prep was completed: yes AWV questionnaire completed by patient prior to visit?: yes Date:: 11/17/24 Living arrangements:: lives with spouse/significant other Patient's Overall Health Status Rating: good Typical amount of pain: some Does pain affect daily life?: no Are you currently prescribed opioids?: no  Dietary Habits and Nutritional Risks How many meals a day?: 3 Eats fruit and vegetables daily?: yes Most meals are obtained by: preparing own meals In the last 2 weeks, have you had any of the following?: none Diabetic:: (!) yes Any non-healing wounds?: no How often do you check your BS?: as needed Would you like to be referred to a Nutritionist or for Diabetic Management? : no  Functional Status Activities of Daily Living (to include ambulation/medication): Independent Ambulation: Independent Medication Administration: Independent Home Management (perform basic housework or laundry): Independent Manage your own finances?: yes Primary transportation is: driving Concerns about vision?: no *vision screening is required for WTM* Concerns about hearing?: no  Fall Screening Falls in the past year?: 0 Number of falls in past year: 0 Was there an injury with Fall?: 0 Fall Risk Category Calculator: 0 Patient Fall Risk Level: Low Fall Risk  Fall Risk Patient at Risk for Falls Due to: No Fall Risks Fall risk Follow up: Falls evaluation completed  Home and Transportation Safety: All rugs have non-skid backing?: N/A, no rugs All stairs or steps  have railings?: yes Grab bars in the bathtub or shower?: (!) no Have non-skid surface in bathtub or shower?: (!) no Good home lighting?: yes Regular seat belt use?: yes Hospital stays in the last year:: no  Cognitive Assessment Difficulty concentrating, remembering, or making decisions? : yes Will 6CIT or Mini Cog be Completed: yes What year is it?: 0 points What month is it?: 0 points Give patient an address phrase to remember (5 components): [address] [place] About what time is it?: 0 points Count backwards from 20 to 1: 0 points Say the months of the year in reverse: 0 points Repeat the address phrase from earlier: 0 points 6 CIT Score: 0 points  Advance Directives (For Healthcare) Does Patient Have a Medical Advance Directive?: Yes Type of Advance Directive: Healthcare Power of [place]; Living will Copy of Healthcare Power of Attorney in Chart?: No - copy requested Copy of Living Will in Chart?: No - copy requested  Reviewed/Updated  Reviewed/Updated: Reviewed All (Medical, Surgical, Family, Medications, Allergies, Care Teams, Patient Goals); Medical History; Surgical History; Family History; Medications; Allergies; Care Teams; Patient Goals    Allergies (verified) Flaxseed (linseed) and Flax [flax seed oil]   Current Medications (verified) Outpatient Encounter Medications as of 11/18/2024  Medication Sig   albuterol  (VENTOLIN  HFA) 108 (90 Base) MCG/ACT inhaler USE 2 INHALATIONS BY MOUTH 15  MINUTES APART EVERY 4 HOURS AS  NEEDED TO RESCUE   BREO ELLIPTA  100-25 MCG/ACT AEPB USE 1 INHALATION BY MOUTH ONCE  DAILY AT THE SAME TIME EACH DAY   glucose blood (ONETOUCH ULTRA) test strip CHECK BLOOD SUGARS FASTING DAILY   midodrine  (PROAMATINE ) 2.5 MG tablet TAKE 1 TABLET (2.5  MG TOTAL) BY MOUTH 2 (TWO) TIMES DAILY WITH A MEAL.   MOUNJARO  7.5 MG/0.5ML Pen INJECT THE CONTENTS OF ONE PEN  SUBCUTANEOUSLY WEEKLY AS  DIRECTED   rosuvastatin  (CRESTOR ) 5 MG tablet Take 1 tablet  (5 mg total) by mouth daily.   tadalafil  (CIALIS ) 20 MG tablet TAKE 1/2 TO 1 TABLET EVERY 2 TO 3 DAYS IF NEEDED   Vitamin D , Ergocalciferol , (DRISDOL ) 1.25 MG (50000 UNIT) CAPS capsule 1 capsule once a week for Vitamin D  Deficiency   [DISCONTINUED] fludrocortisone  (FLORINEF ) 0.1 MG tablet Take 0.1 mg by mouth daily.   No facility-administered encounter medications on file as of 11/18/2024.    History: Past Medical History:  Diagnosis Date   Allergy 1999   Arthritis    LEFT shoulder/RIGHT hand   Asthma    uses inhaler   Chronic kidney disease    COPD (chronic obstructive pulmonary disease) (HCC)    stage 1   Erectile dysfunction    Hyperlipidemia    on meds   Hypertension    on meds   Other testicular hypofunction    Sleep apnea    uses CPAP nightly   Type II or unspecified type diabetes mellitus without mention of complication, not stated as uncontrolled    on meds   Past Surgical History:  Procedure Laterality Date   DENTAL SURGERY     VASECTOMY     Family History  Problem Relation Age of Onset   Stroke Mother    Hypertension Mother    Diabetes Mother    Lung cancer Father 52       smoked   Throat cancer Maternal Grandmother 66       smoked   Colon polyps Neg Hx    Colon cancer Neg Hx    Esophageal cancer Neg Hx    Stomach cancer Neg Hx    Rectal cancer Neg Hx    Social History   Occupational History   Not on file  Tobacco Use   Smoking status: Every Day    Current packs/day: 2.00    Average packs/day: 2.0 packs/day for 25.1 years (50.2 ttl pk-yrs)    Types: Cigarettes    Start date: 10/24/1999   Smokeless tobacco: Never  Vaping Use   Vaping status: Never Used  Substance and Sexual Activity   Alcohol use: Not Currently    Alcohol/week: 2.0 standard drinks of alcohol    Types: 2 Cans of beer per week    Comment: occ   Drug use: Never   Sexual activity: Yes   Tobacco Counseling Ready to quit: No Counseling given: Not Answered  SDOH Screenings    Food Insecurity: No Food Insecurity (11/18/2024)  Housing: Unknown (11/18/2024)  Transportation Needs: No Transportation Needs (11/18/2024)  Utilities: Not At Risk (11/18/2024)  Alcohol Screen: Low Risk (07/10/2024)  Depression (PHQ2-9): Low Risk (11/18/2024)  Financial Resource Strain: Low Risk (07/10/2024)  Physical Activity: Sufficiently Active (11/18/2024)  Social Connections: Socially Integrated (11/18/2024)  Stress: No Stress Concern Present (11/18/2024)  Tobacco Use: High Risk (11/18/2024)  Health Literacy: Adequate Health Literacy (11/18/2024)   See flowsheets for full screening details  Depression Screen PHQ 2 & 9 Depression Scale- Over the past 2 weeks, how often have you been bothered by any of the following problems? Little interest or pleasure in doing things: 0 Feeling down, depressed, or hopeless (PHQ Adolescent also includes...irritable): 0 PHQ-2 Total Score: 0      Goals Addressed  This Visit's Progress    Exercise 150 min/wk Moderate Activity       Stay active and enjoy retirement.              Objective:    Today's Vitals   11/18/24 1503  BP: 118/62  Pulse: 65  Temp: 97.7 F (36.5 C)  SpO2: 98%  Weight: 216 lb (98 kg)  Height: 5' 7.5 (1.715 m)   Body mass index is 33.33 kg/m.   Physical Exam Vitals and nursing note reviewed.  Constitutional:      Appearance: Normal appearance. He is normal weight.  HENT:     Head: Normocephalic and atraumatic.  Cardiovascular:     Rate and Rhythm: Normal rate and regular rhythm.     Pulses: Normal pulses.     Heart sounds: Normal heart sounds.  Pulmonary:     Effort: Pulmonary effort is normal.     Breath sounds: Normal breath sounds.  Skin:    General: Skin is warm and dry.     Capillary Refill: Capillary refill takes less than 2 seconds.  Neurological:     General: No focal deficit present.     Mental Status: He is alert and oriented to person, place, and time. Mental status is at  baseline.  Psychiatric:        Mood and Affect: Mood normal.        Behavior: Behavior normal.        Thought Content: Thought content normal.        Judgment: Judgment normal.       Hearing/Vision screen Hearing Screening - Comments:: No hearing issues.  Vision Screening - Comments:: Glasses. [employer] [facility] [employer].  Immunizations and Health Maintenance Health Maintenance  Topic Date Due   OPHTHALMOLOGY EXAM  11/23/2022   Zoster Vaccines- Shingrix  (2 of 2) 05/19/2024   Colonoscopy  01/08/2025   Influenza Vaccine  01/20/2025 (Originally 05/23/2024)   DTaP/Tdap/Td (2 - Td or Tdap) 01/03/2025   FOOT EXAM  03/24/2025   HEMOGLOBIN A1C  04/26/2025   Lung Cancer Screening  06/02/2025   Diabetic kidney evaluation - eGFR measurement  10/27/2025   Diabetic kidney evaluation - Urine ACR  10/27/2025   Medicare Annual Wellness (AWV)  11/18/2025   Pneumococcal Vaccine: 50+ Years  Completed   Hepatitis C Screening  Completed   Meningococcal B Vaccine  Aged Out   COVID-19 Vaccine  Discontinued    EKG: NSR with PACs, recent EKG 03/25/2024     Assessment/Plan:  This is a routine wellness examination for [place].  Patient Care Team: Kayla Jeoffrey RAMAN, FNP as PCP - General (Family Medicine) Franky Corporal, OD (Optometry)  I have personally reviewed and noted the following in the patients chart:   Medical and social history Use of alcohol, tobacco or illicit drugs  Current medications and supplements including opioid prescriptions. Functional ability and status Nutritional status Physical activity Advanced directives List of other physicians Hospitalizations, surgeries, and ER visits in previous 12 months Vitals Screenings to include cognitive, depression, and falls Referrals and appointments  No orders of the defined types were placed in this encounter.  In addition, I have reviewed and discussed with patient certain preventive protocols, quality metrics, and best practice  recommendations. A written personalized care plan for preventive services as well as general preventive health recommendations were provided to patient.   Jeoffrey RAMAN Kayla, FNP   11/18/2024   Return in 4 weeks (on 12/16/2024) for hypertension, diabetes, follow-up.  "

## 2024-11-18 NOTE — Addendum Note (Signed)
 Addended by: KAYLA JEOFFREY RAMAN on: 11/18/2024 04:03 PM   Modules accepted: Orders

## 2024-11-18 NOTE — Assessment & Plan Note (Signed)
 Refill Cialis  provided

## 2024-11-18 NOTE — Assessment & Plan Note (Signed)
 Luis Bonilla has lost a large amount of weight since starting Mounjaro  and would like to try to wean off and assess control of his diabetes. Decrease to 5mg  weekly. Monitor BG at home daily fastings and report if readings sustain >150. Follow up in 1 month  Lab Results  Component Value Date   HGBA1C 5.7 (H) 10/27/2024   HGBA1C 5.9 (H) 07/07/2024   HGBA1C 5.7 (H) 03/24/2024

## 2024-11-25 ENCOUNTER — Telehealth: Payer: Self-pay | Admitting: Pharmacy Technician

## 2024-11-25 ENCOUNTER — Other Ambulatory Visit (HOSPITAL_COMMUNITY): Payer: Self-pay

## 2024-11-25 NOTE — Telephone Encounter (Signed)
 Pharmacy Patient Advocate Encounter   Received notification from CoverMyMeds that prior authorization for Mounjaro  5MG /0.5ML auto-injectors is required/requested.   Insurance verification completed.   The patient is insured through [employer].   Per test claim: PA required; PA started via CoverMyMeds. KEY [id number] . Waiting for clinical questions to populate.

## 2024-11-26 ENCOUNTER — Other Ambulatory Visit (HOSPITAL_COMMUNITY): Payer: Self-pay

## 2024-11-26 NOTE — Telephone Encounter (Signed)
 Pharmacy Patient Advocate Encounter  Received notification from [employer] that Prior Authorization for Mounjaro  5MG /0.5ML auto-injectors  has been APPROVED from 11/25/24 to 10/22/25. Ran test claim, Copay is $259.51. This test claim was processed through [facility]- copay amounts may vary at other pharmacies due to pharmacy/plan contracts, or as the patient moves through the different stages of their insurance plan.   PA #/Case ID/Reference #: [id number]

## 2024-11-26 NOTE — Telephone Encounter (Signed)
 Message sent to patient for update. Awaiting to see which pharmacy he would like the medication filled at.

## 2024-12-18 ENCOUNTER — Ambulatory Visit: Admitting: Family Medicine

## 2025-02-11 ENCOUNTER — Encounter: Admitting: Family Medicine

## 2025-03-23 ENCOUNTER — Other Ambulatory Visit

## 2025-03-26 ENCOUNTER — Encounter: Admitting: Family Medicine
